# Patient Record
Sex: Male | Born: 1965 | Race: White | Hispanic: Yes | Marital: Married | Smoking: Never smoker
Health system: Southern US, Community
[De-identification: ages and names within clinical notes are randomized; demographics above are authoritative.]

## PROBLEM LIST (undated history)

## (undated) DIAGNOSIS — S648X2A Injury of other nerves at wrist and hand level of left arm, initial encounter: Secondary | ICD-10-CM

## (undated) DIAGNOSIS — J069 Acute upper respiratory infection, unspecified: Secondary | ICD-10-CM

## (undated) DIAGNOSIS — C187 Malignant neoplasm of sigmoid colon: Secondary | ICD-10-CM

## (undated) DIAGNOSIS — G43909 Migraine, unspecified, not intractable, without status migrainosus: Secondary | ICD-10-CM

## (undated) DIAGNOSIS — F329 Major depressive disorder, single episode, unspecified: Secondary | ICD-10-CM

## (undated) DIAGNOSIS — F32A Depression, unspecified: Secondary | ICD-10-CM

## (undated) DIAGNOSIS — Z8619 Personal history of other infectious and parasitic diseases: Secondary | ICD-10-CM

## (undated) DIAGNOSIS — R51 Headache: Secondary | ICD-10-CM

## (undated) DIAGNOSIS — S62633A Displaced fracture of distal phalanx of left middle finger, initial encounter for closed fracture: Secondary | ICD-10-CM

## (undated) DIAGNOSIS — E119 Type 2 diabetes mellitus without complications: Secondary | ICD-10-CM

## (undated) DIAGNOSIS — T7840XA Allergy, unspecified, initial encounter: Secondary | ICD-10-CM

## (undated) DIAGNOSIS — E785 Hyperlipidemia, unspecified: Secondary | ICD-10-CM

## (undated) DIAGNOSIS — K219 Gastro-esophageal reflux disease without esophagitis: Secondary | ICD-10-CM

## (undated) HISTORY — DX: Headache: R51

## (undated) HISTORY — PX: OTHER SURGICAL HISTORY: SHX169

## (undated) HISTORY — DX: Gastro-esophageal reflux disease without esophagitis: K21.9

## (undated) HISTORY — DX: Allergy, unspecified, initial encounter: T78.40XA

## (undated) HISTORY — DX: Depression, unspecified: F32.A

## (undated) HISTORY — DX: Hyperlipidemia, unspecified: E78.5

## (undated) HISTORY — DX: Major depressive disorder, single episode, unspecified: F32.9

## (undated) HISTORY — PX: COLONOSCOPY: SHX174

---
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
## 2010-03-15 ENCOUNTER — Emergency Department (HOSPITAL_COMMUNITY)
Admission: EM | Admit: 2010-03-15 | Discharge: 2010-03-15 | Payer: Self-pay | Source: Home / Self Care | Admitting: Emergency Medicine

## 2010-08-20 ENCOUNTER — Emergency Department (HOSPITAL_COMMUNITY)
Admission: EM | Admit: 2010-08-20 | Discharge: 2010-08-20 | Payer: Self-pay | Source: Home / Self Care | Admitting: Emergency Medicine

## 2010-08-20 LAB — CBC
HCT: 44.3 % (ref 39.0–52.0)
MCHC: 36.3 g/dL — ABNORMAL HIGH (ref 30.0–36.0)
MCV: 85.4 fL (ref 78.0–100.0)
Platelets: 169 10*3/uL (ref 150–400)
RDW: 12.3 % (ref 11.5–15.5)

## 2010-08-20 LAB — BASIC METABOLIC PANEL
BUN: 16 mg/dL (ref 6–23)
Calcium: 8.5 mg/dL (ref 8.4–10.5)
GFR calc Af Amer: >60 mL/min (ref >60)
Glucose, Bld: 155 mg/dL — ABNORMAL HIGH (ref 70–99)
Potassium: 3.7 mEq/L (ref 3.5–5.1)
Sodium: 139 mEq/L (ref 135–145)

## 2010-08-20 LAB — DIFFERENTIAL
Basophils Absolute: 0 10*3/uL (ref 0.0–0.1)
Basophils Relative: 1 % (ref 0–1)
Eosinophils Absolute: 0.2 10*3/uL (ref 0.0–0.7)
Lymphocytes Relative: 28 % (ref 12–46)
Monocytes Absolute: 0.5 10*3/uL (ref 0.1–1.0)
Monocytes Relative: 8 % (ref 3–12)
Neutrophils Relative %: 61 % (ref 43–77)

## 2010-12-11 ENCOUNTER — Encounter: Payer: Self-pay | Admitting: Emergency Medicine

## 2010-12-12 ENCOUNTER — Encounter: Payer: Self-pay | Admitting: Emergency Medicine

## 2010-12-12 ENCOUNTER — Ambulatory Visit (INDEPENDENT_AMBULATORY_CARE_PROVIDER_SITE_OTHER)
Admission: RE | Admit: 2010-12-12 | Discharge: 2010-12-12 | Disposition: A | Discharge status: Home / Self Care | Payer: PRIVATE HEALTH INSURANCE | Source: Ambulatory Visit | Attending: Emergency Medicine | Admitting: Emergency Medicine

## 2010-12-12 ENCOUNTER — Ambulatory Visit (INDEPENDENT_AMBULATORY_CARE_PROVIDER_SITE_OTHER): Payer: PRIVATE HEALTH INSURANCE | Admitting: Emergency Medicine

## 2010-12-12 DIAGNOSIS — R51 Headache: Secondary | ICD-10-CM

## 2010-12-12 DIAGNOSIS — R05 Cough: Secondary | ICD-10-CM

## 2010-12-12 DIAGNOSIS — F329 Major depressive disorder, single episode, unspecified: Secondary | ICD-10-CM

## 2010-12-12 DIAGNOSIS — J309 Allergic rhinitis, unspecified: Secondary | ICD-10-CM | POA: Insufficient documentation

## 2010-12-12 DIAGNOSIS — F32A Depression, unspecified: Secondary | ICD-10-CM | POA: Insufficient documentation

## 2010-12-12 MED ORDER — LORATADINE 10 MG PO CAPS: 10.0000 mg | ORAL_CAPSULE | Freq: Every day | ORAL | Status: DC

## 2010-12-12 MED ORDER — OMEPRAZOLE 20 MG PO TBEC: 20.0000 mg | DELAYED_RELEASE_TABLET | Freq: Every day | ORAL | Status: DC

## 2010-12-12 NOTE — Assessment & Plan Note (Signed)
Full PFT CXR Add loratadine every day Contin nasal steroid Add omeprazole qd  rov next available

## 2010-12-12 NOTE — Patient Instructions (Signed)
Continue your fluticasone nasal spray 2 sprays each nostril twice a day Start loratadine (Claritin) 10mg  daily Start omeprazole 20mg  daily We will perform a CXR today We will perform full pulmonary function testing on the date of your next visit Follow up with Dr Delton Coombes next available appointment

## 2010-12-12 NOTE — Progress Notes (Signed)
  Subjective:    Patient ID: Oscar Mccarty, male    DOB: 1966-04-13, 45 y.o.   MRN: [id number]  HPI 45 yo never smoker, no significant PMH. He presents for cough, referred by Dr Egbert Garibaldi. The cough began about 1 month ago, non-productive. Began to have nasal congestion and drainage more recently but not at the beginning of this illness. He has been treated with prednisone and fluticasone spray for nasal gtt and allergies. Also given an albuterol. Hx of ? GERD. Ventolin doesn't seem to affect the cough. 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    Review of Systems  HENT: Positive for ear pain, congestion, sneezing and postnasal drip.   Respiratory: Positive for cough.   Cardiovascular: Positive for chest pain.  Gastrointestinal: Positive for abdominal pain.       Heartburn Abdominal pain Weight loss--down 5 lbs in 1 month Difficulty swallowing  Neurological: Positive for headaches.    Past Medical History  Diagnosis Date  . Depression   . Allergic rhinitis   . Chronic headaches      No family history on file.   History   Social History  . Marital Status: Married    Spouse Name: N/A    Number of Children: N/A  . Years of Education: N/A   Occupational History  . Not on file.   Social History Main Topics  . Smoking status: Never Smoker   . Smokeless tobacco: Not on file  . Alcohol Use: Yes  . Drug Use: Not on file  . Sexually Active: Not on file   Other Topics Concern  . Not on file   Social History Narrative  . No narrative on file     No Known Allergies   Outpatient Prescriptions Prior to Visit  Medication Sig Dispense Refill  . albuterol (PROVENTIL HFA;VENTOLIN HFA) 108 (90 BASE) MCG/ACT inhaler Inhale 2 puffs into the lungs every 4 (four) hours as needed.        . predniSONE (DELTASONE) 10 MG tablet Take 10 mg by mouth daily. Take 6 x 2 days, 5 x 2 days, 4 x 2 days, 3 x 2 days, 2 x 2 days, 1  x 2 days then stop              Objective:   Physical Exam Gen: Pleasant, well-nourished, in no distress,  normal affect  ENT: No lesions,  mouth clear,  oropharynx clear, no postnasal drip  Neck: No JVD, no TMG, no carotid bruits  Lungs: No use of accessory muscles, no dullness to percussion, clear without rales or rhonchi  Cardiovascular: RRR, heart sounds normal, no murmur or gallops, no peripheral edema  Musculoskeletal: No deformities, no cyanosis or clubbing  Neuro: alert, non focal  Skin: Warm, no lesions or rashes          Assessment & Plan:  Cough Full PFT CXR Add loratadine every day Contin nasal steroid Add omeprazole qd  rov next available

## 2011-01-25 ENCOUNTER — Ambulatory Visit (INDEPENDENT_AMBULATORY_CARE_PROVIDER_SITE_OTHER): Payer: PRIVATE HEALTH INSURANCE | Admitting: Emergency Medicine

## 2011-01-25 ENCOUNTER — Encounter: Payer: Self-pay | Admitting: Emergency Medicine

## 2011-01-25 DIAGNOSIS — J309 Allergic rhinitis, unspecified: Secondary | ICD-10-CM

## 2011-01-25 DIAGNOSIS — R05 Cough: Secondary | ICD-10-CM

## 2011-01-25 DIAGNOSIS — R51 Headache: Secondary | ICD-10-CM

## 2011-01-25 LAB — PULMONARY FUNCTION TEST

## 2011-01-25 MED ORDER — SUMATRIPTAN SUCCINATE 50 MG PO TABS: 25.0000 mg | ORAL_TABLET | Freq: Once | ORAL | Status: DC | PRN

## 2011-01-25 NOTE — Assessment & Plan Note (Signed)
Will stop nasal steroid, continue the loratadine daily for now

## 2011-01-25 NOTE — Progress Notes (Signed)
  Subjective:    Patient ID: Oscar Mccarty, male    DOB: Nov 22, 1965, 45 y.o.   MRN: [id number]  HPI 45 yo never smoker, no significant PMH. He presents for cough, referred by Dr Egbert Garibaldi. The cough began about 1 month ago, non-productive. Began to have nasal congestion and drainage more recently but not at the beginning of this illness. He has been treated with prednisone and fluticasone spray for nasal gtt and allergies. Also given an albuterol. Hx of ? GERD. Ventolin doesn't seem to affect the cough. 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   ROV 01/25/11 -- returns for f/u, tells me that he took loratadine x 1 month, was on fluticasone nasal spray, then started omeprazole (finished about 1 week ago). He continues to have some wheeze in cold air or that wakes him at night. Still with occasional cough. He tells me that the cough is much better.   ROS:  As per HPI     Objective:   Physical Exam Gen: Pleasant, well-nourished, in no distress,  normal affect  ENT: No lesions,  mouth clear,  oropharynx clear, no postnasal drip  Neck: No JVD, no TMG, no carotid bruits  Lungs: No use of accessory muscles, no dullness to percussion, clear without rales or rhonchi  Cardiovascular: RRR, heart sounds normal, no murmur or gallops, no peripheral edema  Musculoskeletal: No deformities, no cyanosis or clubbing  Neuro: alert, non focal  Skin: Warm, no lesions or rashes    Assessment & Plan:  Allergic rhinitis Will stop nasal steroid, continue the loratadine daily for now  Cough Continue to treat allergies Hold omeprazole We will set up methacholine challenge F/u 1 month  Chronic headaches He describes classic migraine HA's - wants a script to try for this - will try Imitrex prn

## 2011-01-25 NOTE — Progress Notes (Signed)
PFT done today. 

## 2011-01-25 NOTE — Assessment & Plan Note (Signed)
He describes classic migraine HA's - wants a script to try for this - will try Imitrex prn

## 2011-01-25 NOTE — Patient Instructions (Signed)
We will set up repeat breathing testing at [facility] (methacholine challenge test) Continue your loratadine 10mg  daily (Claritin)  Stop nasal spray and omeprazole Follow with Dr Delton Coombes in 1 month

## 2011-01-25 NOTE — Assessment & Plan Note (Signed)
Continue to treat allergies Hold omeprazole We will set up methacholine challenge F/u 1 month

## 2011-01-30 ENCOUNTER — Ambulatory Visit (HOSPITAL_COMMUNITY)
Admission: RE | Admit: 2011-01-30 | Discharge: 2011-01-30 | Disposition: A | Discharge status: Home / Self Care | Payer: PRIVATE HEALTH INSURANCE | Source: Ambulatory Visit | Attending: Emergency Medicine | Admitting: Emergency Medicine

## 2011-01-30 DIAGNOSIS — R05 Cough: Secondary | ICD-10-CM | POA: Insufficient documentation

## 2011-01-30 DIAGNOSIS — R059 Cough, unspecified: Secondary | ICD-10-CM | POA: Insufficient documentation

## 2011-01-30 LAB — PULMONARY FUNCTION TEST

## 2011-03-02 ENCOUNTER — Encounter: Payer: Self-pay | Admitting: Emergency Medicine

## 2011-03-02 ENCOUNTER — Ambulatory Visit (INDEPENDENT_AMBULATORY_CARE_PROVIDER_SITE_OTHER): Payer: PRIVATE HEALTH INSURANCE | Admitting: Emergency Medicine

## 2011-03-02 DIAGNOSIS — J309 Allergic rhinitis, unspecified: Secondary | ICD-10-CM

## 2011-03-02 DIAGNOSIS — R05 Cough: Secondary | ICD-10-CM

## 2011-03-02 MED ORDER — LORATADINE 10 MG PO TABS: 10.0000 mg | ORAL_TABLET | Freq: Every day | ORAL | Status: DC

## 2011-03-02 NOTE — Progress Notes (Signed)
  Subjective:    Patient ID: Oscar Mccarty, male    DOB: Jun 05, 1966, 45 y.o.   MRN: [id number]  HPI 45 yo never smoker, no significant PMH. He presents for cough, referred by Dr Egbert Garibaldi. The cough began about 1 month ago, non-productive. Began to have nasal congestion and drainage more recently but not at the beginning of this illness. He has been treated with prednisone and fluticasone spray for nasal gtt and allergies. Also given an albuterol. Hx of ? GERD. Ventolin doesn't seem to affect the cough. 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   ROV 01/25/11 -- returns for f/u, tells me that he took loratadine x 1 month, was on fluticasone nasal spray, then started omeprazole (finished about 1 week ago). He continues to have some wheeze in cold air or that wakes him at night. Still with occasional cough. He tells me that the cough is much better.   ROV 03/02/11 -- returns for f/u cough. Last time we continued loratadine prn, held nasal steroid, stopped PPI. Methacholine 7/10 was normal, so no evidence of asthma. He also started imitrex prn for migraines, which helped. Cough is better, only happening rarely now.   ROS:  As per HPI     Objective:   Physical Exam Gen: Pleasant, well-nourished, in no distress,  normal affect  ENT: No lesions,  mouth clear,  oropharynx clear, no postnasal drip  Neck: No JVD, no TMG, no carotid bruits  Lungs: No use of accessory muscles, no dullness to percussion, clear without rales or rhonchi  Cardiovascular: RRR, heart sounds normal, no murmur or gallops, no peripheral edema  Musculoskeletal: No deformities, no cyanosis or clubbing  Neuro: alert, non focal  Skin: Warm, no lesions or rashes    Assessment & Plan:  Allergic rhinitis If allergy sx return then restart loratadine daily  Cough Methacholine negative for asthma. Suspect allergies are his trigger

## 2011-03-02 NOTE — Assessment & Plan Note (Signed)
If allergy sx return then restart loratadine daily

## 2011-03-02 NOTE — Assessment & Plan Note (Signed)
Methacholine negative for asthma. Suspect allergies are his trigger

## 2011-03-02 NOTE — Patient Instructions (Signed)
Your testing shows that you do not have asthma.  If your cough and/or allergies return then start taking loratadine (Claritin) every day Follow up with Dr Delton Coombes if you develop any new problems

## 2011-09-14 ENCOUNTER — Ambulatory Visit (INDEPENDENT_AMBULATORY_CARE_PROVIDER_SITE_OTHER): Payer: PRIVATE HEALTH INSURANCE | Admitting: Adult Health

## 2011-09-14 ENCOUNTER — Encounter: Payer: Self-pay | Admitting: Adult Health

## 2011-09-14 VITALS — BP 104/64 | HR 64 | Temp 97.4°F | Ht 67.0 in | Wt 185.0 lb

## 2011-09-14 DIAGNOSIS — J40 Bronchitis, not specified as acute or chronic: Secondary | ICD-10-CM

## 2011-09-14 MED ORDER — PREDNISONE 10 MG PO TABS: ORAL_TABLET | ORAL | Status: DC

## 2011-09-14 MED ORDER — HYDROCODONE-HOMATROPINE 5-1.5 MG/5ML PO SYRP: 5.0000 mL | ORAL_SOLUTION | Freq: Four times a day (QID) | ORAL | Status: AC | PRN

## 2011-09-14 MED ORDER — MOMETASONE FUROATE 50 MCG/ACT NA SUSP: 2.0000 | Freq: Two times a day (BID) | NASAL | Status: DC

## 2011-09-14 NOTE — Progress Notes (Signed)
  Subjective:    Patient ID: Oscar Mccarty, male    DOB: [birth year], 46 y.o.   MRN: [id number]  HPI 46 yo never smoker, no significant PMH. He presents for cough, referred by Dr Egbert Garibaldi. The cough began about 1 month ago, non-productive. Began to have nasal congestion and drainage more recently but not at the beginning of this illness. He has been treated with prednisone and fluticasone spray for nasal gtt and allergies. Also given an albuterol. Hx of ? GERD. Ventolin doesn't seem to affect the cough. 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   ROV 01/25/11 -- returns for f/u, tells me that he took loratadine x 1 month, was on fluticasone nasal spray, then started omeprazole (finished about 1 week ago). He continues to have some wheeze in cold air or that wakes him at night. Still with occasional cough. He tells me that the cough is much better.   ROV 03/02/11 -- returns for f/u cough. Last time we continued loratadine prn, held nasal steroid, stopped PPI. Methacholine 7/10 was normal, so no evidence of asthma. He also started imitrex prn for migraines, which helped. Cough is better, only happening rarely now.   09/14/11 Acute OV  Presents for persistent sinus pressure/congestion with white drainage, wheezing, dry cough, some increased SOB x3 week. Initially went to urgent care  And was given abx and cough syrup with some help. ? Name of abx is unclear. No records are available for todays visit.  No chest pain or edema. Sinus drainage is clear. No fever. Cough will not go away.     ROS:  Constitutional:   No  weight loss, night sweats,  Fevers, chills,  +fatigue, or  lassitude.  HEENT:   No headaches,  Difficulty swallowing,  Tooth/dental problems, or  Sore throat,                No sneezing, itching, ear ache, + nasal congestion, post nasal drip,   CV:  No chest pain,  Orthopnea, PND, swelling in lower extremities, anasarca,  dizziness, palpitations, syncope.   GI  No heartburn, indigestion, abdominal pain, nausea, vomiting, diarrhea, change in bowel habits, loss of appetite, bloody stools.   Resp:    No coughing up of blood.    No chest wall deformity  Skin: no rash or lesions.  GU: no dysuria, change in color of urine, no urgency or frequency.  No flank pain, no hematuria   MS:  No joint pain or swelling.  No decreased range of motion.  No back pain.  Psych:  No change in mood or affect. No depression or anxiety.  No memory loss.          Objective:   Physical Exam Gen: Pleasant, well-nourished, in no distress,  normal affect  ENT: No lesions,  mouth clear,  oropharynx clear, +postnasal drip  Neck: No JVD, no TMG, no carotid bruits  Lungs: No use of accessory muscles, no dullness to percussion, coarse BS w/ no wheezing.   Cardiovascular: RRR, heart sounds normal, no murmur or gallops, no peripheral edema  Musculoskeletal: No deformities, no cyanosis or clubbing  Neuro: alert, non focal  Skin: Warm, no lesions or rashes    Assessment & Plan:

## 2011-09-14 NOTE — Patient Instructions (Signed)
Prednisone taper over next week.  Zyrtec 10mg  At bedtime  -take every day (this is over the counter )  Nasonex 2 puffs Twice daily  -sent this prescription to pharmacy.  Hydromet 1-2 tsp every 4-6hr As needed  Cough, -may make you sleepy.  follow up Dr. Delton Coombes  In 1 month and As needed

## 2011-09-17 DIAGNOSIS — J40 Bronchitis, not specified as acute or chronic: Secondary | ICD-10-CM | POA: Insufficient documentation

## 2011-09-17 NOTE — Assessment & Plan Note (Addendum)
Slow to resolve with associated rhinitis   Plan:  Prednisone taper over next week.  Zyrtec 10mg  At bedtime  -take every day (this is over the counter )  Nasonex 2 puffs Twice daily  -sent this prescription to pharmacy.  Hydromet 1-2 tsp every 4-6hr As needed  Cough, -may make you sleepy.  follow up Dr. Delton Coombes  In 1 month and As needed

## 2011-10-09 ENCOUNTER — Ambulatory Visit (INDEPENDENT_AMBULATORY_CARE_PROVIDER_SITE_OTHER): Payer: PRIVATE HEALTH INSURANCE | Admitting: Emergency Medicine

## 2011-10-09 ENCOUNTER — Encounter: Payer: Self-pay | Admitting: Emergency Medicine

## 2011-10-09 DIAGNOSIS — R05 Cough: Secondary | ICD-10-CM

## 2011-10-09 DIAGNOSIS — J309 Allergic rhinitis, unspecified: Secondary | ICD-10-CM

## 2011-10-09 DIAGNOSIS — R51 Headache: Secondary | ICD-10-CM

## 2011-10-09 MED ORDER — LORATADINE 10 MG PO TABS
10.0000 mg | ORAL_TABLET | Freq: Every day | ORAL | Status: DC
Start: 2011-10-09 — End: 2019-01-10

## 2011-10-09 MED ORDER — SUMATRIPTAN SUCCINATE 50 MG PO TABS: 25.0000 mg | ORAL_TABLET | Freq: Once | ORAL | Status: DC | PRN

## 2011-10-09 NOTE — Progress Notes (Signed)
  Subjective:    Patient ID: Oscar Mccarty, male    DOB: 18-Jul-1966, 46 y.o.   MRN: [id number]  HPI 46 yo never smoker, no significant PMH. He presents for cough, referred by Dr Egbert Garibaldi. The cough began about 1 month ago, non-productive. Began to have nasal congestion and drainage more recently but not at the beginning of this illness. He has been treated with prednisone and fluticasone spray for nasal gtt and allergies. Also given an albuterol. Hx of ? GERD. Ventolin doesn't seem to affect the cough. 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   ROV 01/25/11 -- returns for f/u, tells me that he took loratadine x 1 month, was on fluticasone nasal spray, then started omeprazole (finished about 1 week ago). He continues to have some wheeze in cold air or that wakes him at night. Still with occasional cough. He tells me that the cough is much better.   ROV 03/02/11 -- returns for f/u cough. Last time we continued loratadine prn, held nasal steroid, stopped PPI. Methacholine 7/10 was normal, so no evidence of asthma. He also started imitrex prn for migraines, which helped. Cough is better, only happening rarely now.   09/14/11 Acute OV  Presents for persistent sinus pressure/congestion with white drainage, wheezing, dry cough, some increased SOB x3 week. Initially went to urgent care  And was given abx and cough syrup with some help. ? Name of abx is unclear. No records are available for todays visit.  No chest pain or edema. Sinus drainage is clear. No fever. Cough will not go away.   ROV 10/09/11 -- Hx allergies and chronic sinus disease, no evidence asthma on methacholine. Seen 2/22 as above, ? Whether he started having more trouble after a URI.  Treated with Pred taper, nasonex added, hydromet prn. He improved but now beginning to have nasal congestion. He is currently taking loratadine and nasonex.      Objective:   Physical Exam Gen:  Pleasant, well-nourished, in no distress,  normal affect  ENT: No lesions,  mouth clear,  oropharynx clear, +postnasal drip  Neck: No JVD, no TMG, no carotid bruits  Lungs: No use of accessory muscles, no dullness to percussion, coarse BS w/ no wheezing.   Cardiovascular: RRR, heart sounds normal, no murmur or gallops, no peripheral edema  Musculoskeletal: No deformities, no cyanosis or clubbing  Neuro: alert, non focal  Skin: Warm, no lesions or rashes    Assessment & Plan:   Allergic rhinitis Loratadine + nasal steroid [place] if he is willing to do Consider referral for skin testing   Chronic headaches Refill imitrex

## 2011-10-09 NOTE — Assessment & Plan Note (Signed)
Refill imitrex

## 2011-10-09 NOTE — Assessment & Plan Note (Signed)
Loratadine + nasal steroid [place] if he is willing to do Consider referral for skin testing

## 2011-10-09 NOTE — Patient Instructions (Signed)
Please continue your Claritin and nasonex spray as you are taking them Consider starting Nasal Saline Washes to clear your nose and sinuses. You can get these over-the-counter at the pharmacy We will refill your Imitrex for migraines If your allergies do not improve we could refer you for allergy testing and possible allergy shots.  Follow with Dr Delton Coombes as needed.

## 2012-10-25 ENCOUNTER — Emergency Department (HOSPITAL_COMMUNITY): Payer: No Typology Code available for payment source

## 2012-10-25 ENCOUNTER — Emergency Department (HOSPITAL_COMMUNITY)
Admission: EM | Admit: 2012-10-25 | Discharge: 2012-10-25 | Disposition: A | Discharge status: Home / Self Care | Payer: No Typology Code available for payment source | Attending: Emergency Medicine | Admitting: Emergency Medicine

## 2012-10-25 ENCOUNTER — Encounter (HOSPITAL_COMMUNITY): Payer: Self-pay | Admitting: Emergency Medicine

## 2012-10-25 DIAGNOSIS — J069 Acute upper respiratory infection, unspecified: Secondary | ICD-10-CM | POA: Insufficient documentation

## 2012-10-25 DIAGNOSIS — F3289 Other specified depressive episodes: Secondary | ICD-10-CM | POA: Insufficient documentation

## 2012-10-25 DIAGNOSIS — G479 Sleep disorder, unspecified: Secondary | ICD-10-CM | POA: Insufficient documentation

## 2012-10-25 DIAGNOSIS — R05 Cough: Secondary | ICD-10-CM | POA: Insufficient documentation

## 2012-10-25 DIAGNOSIS — Z79899 Other long term (current) drug therapy: Secondary | ICD-10-CM | POA: Insufficient documentation

## 2012-10-25 DIAGNOSIS — R079 Chest pain, unspecified: Secondary | ICD-10-CM | POA: Insufficient documentation

## 2012-10-25 DIAGNOSIS — R509 Fever, unspecified: Secondary | ICD-10-CM | POA: Insufficient documentation

## 2012-10-25 DIAGNOSIS — J029 Acute pharyngitis, unspecified: Secondary | ICD-10-CM | POA: Insufficient documentation

## 2012-10-25 DIAGNOSIS — F329 Major depressive disorder, single episode, unspecified: Secondary | ICD-10-CM | POA: Insufficient documentation

## 2012-10-25 DIAGNOSIS — J00 Acute nasopharyngitis [common cold]: Secondary | ICD-10-CM | POA: Insufficient documentation

## 2012-10-25 DIAGNOSIS — J3489 Other specified disorders of nose and nasal sinuses: Secondary | ICD-10-CM | POA: Insufficient documentation

## 2012-10-25 DIAGNOSIS — R059 Cough, unspecified: Secondary | ICD-10-CM | POA: Insufficient documentation

## 2012-10-25 MED ORDER — ALBUTEROL SULFATE (5 MG/ML) 0.5% IN NEBU
5.0000 mg | INHALATION_SOLUTION | Freq: Once | RESPIRATORY_TRACT | Status: AC
  Administered 2012-10-25: 5 mg via RESPIRATORY_TRACT
  Filled 2012-10-25: qty 1

## 2012-10-25 MED ORDER — AMOXICILLIN 500 MG PO CAPS: 500.0000 mg | ORAL_CAPSULE | Freq: Three times a day (TID) | ORAL | Status: DC

## 2012-10-25 MED ORDER — OXYMETAZOLINE HCL 0.05 % NA SOLN
1.0000 | Freq: Once | NASAL | Status: AC
  Administered 2012-10-25: 1 via NASAL
  Filled 2012-10-25: qty 15

## 2012-10-25 MED ORDER — ACETAMINOPHEN-CODEINE 120-12 MG/5ML PO SUSP: 5.0000 mL | Freq: Three times a day (TID) | ORAL | Status: DC | PRN

## 2012-10-25 NOTE — ED Provider Notes (Signed)
History     CSN: [id number]  Arrival date & time 10/25/12  0143   First MD Initiated Contact with Patient 10/25/12 [phone]      Chief Complaint  Patient presents with  . URI    (Consider location/radiation/quality/duration/timing/severity/associated sxs/prior treatment) HPI Hx per PT - sick for the last 4 days with cough cold and congestion. Subjective fever at home yesterday. Difficulty sleeping for last 4 nights, up with dry cough. He has a prescribed inhaler that he takes with intermittent relief. Some chest pain with coughing tonight, sharp in quality, no leg pain or swelling or shortness of breath.  No rash. No known sick contacts. Symptoms moderate in severity. No known alleviating factors otherwise. Past Medical History  Diagnosis Date  . Depression   . Allergic rhinitis   . Chronic headaches     Past Surgical History  Procedure Laterality Date  . No surgical history      No family history on file.  History  Substance Use Topics  . Smoking status: Never Smoker   . Smokeless tobacco: Not on file  . Alcohol Use: Yes     Comment: occ      Review of Systems  Constitutional: Positive for fever.  HENT: Positive for congestion, sore throat and sinus pressure. Negative for neck pain and neck stiffness.   Eyes: Negative for pain.  Respiratory: Positive for cough. Negative for shortness of breath.   Cardiovascular: Positive for chest pain.  Gastrointestinal: Negative for abdominal pain.  Genitourinary: Negative for dysuria.  Musculoskeletal: Negative for back pain.  Skin: Negative for rash.  Neurological: Negative for headaches.  All other systems reviewed and are negative.    Allergies  Review of patient's allergies indicates no known allergies.  Home Medications   Current Outpatient Rx  Name  Route  Sig  Dispense  Refill  . acetaminophen-codeine 120-12 MG/5ML suspension   Oral   Take 5 mLs by mouth every 8 (eight) hours as needed (Take a nighttime as needed  for cough. No driving or working while medicated).   60 mL   0   . amoxicillin (AMOXIL) 500 MG capsule   Oral   Take 1 capsule (500 mg total) by mouth 3 (three) times daily.   21 capsule   0   . EXPIRED: loratadine (CLARITIN) 10 MG tablet   Oral   Take 1 tablet (10 mg total) by mouth daily.   30 tablet   11   . EXPIRED: mometasone (NASONEX) 50 MCG/ACT nasal spray   Nasal   Place 2 sprays into the nose 2 (two) times daily.   17 g   5   . EXPIRED: SUMAtriptan (IMITREX) 50 MG tablet   Oral   Take 0.5 tablets (25 mg total) by mouth once as needed for migraine.   30 tablet   2     This may be repeated ONCE in 2 hours if the HA per ...   . VENTOLIN HFA 108 (90 BASE) MCG/ACT inhaler   Inhalation   Inhale 2 puffs into the lungs Every 4 hours as needed.           BP 130/78  Pulse 68  Temp(Src) 97.9 F (36.6 C) (Oral)  Resp 20  Wt 185 lb (83.915 kg)  BMI 28.97 kg/m2  SpO2 98%  Physical Exam  Constitutional: He is oriented to person, place, and time. He appears well-developed and well-nourished.  HENT:  Head: Normocephalic and atraumatic.  Mouth/Throat: Oropharynx is clear and  moist. No oropharyngeal exudate.  Tender over bilateral maxillary sinuses with mild erythema. Nasal congestion  Eyes: EOM are normal. Pupils are equal, round, and reactive to light.  Neck: Normal range of motion. Neck supple. No tracheal deviation present.  Cardiovascular: Normal rate, regular rhythm and intact distal pulses.   Pulmonary/Chest: Effort normal and breath sounds normal. No stridor. No respiratory distress. He has no wheezes. He has no rales. He exhibits no tenderness.  Abdominal: Soft. Bowel sounds are normal. He exhibits no distension. There is no tenderness. There is no rebound and no guarding.  Musculoskeletal: Normal range of motion. He exhibits no edema.  Lymphadenopathy:    He has no cervical adenopathy.  Neurological: He is alert and oriented to person, place, and time.   Skin: Skin is warm and dry.    ED Course  Procedures (including critical care time)  Labs Reviewed - No data to display Dg Chest 2 View  10/25/2012  *RADIOLOGY REPORT*  Clinical Data: Chest pain, shortness of breath, and congestion.  CHEST - 2 VIEW  Comparison: 12/12/2010  Findings: Mild hyperinflation. The heart size and pulmonary vascularity are normal. The lungs appear clear and expanded without focal air space disease or consolidation. No blunting of the costophrenic angles.  No pneumothorax.  Mediastinal contours appear intact.  No significant change since previous study.  IMPRESSION: No evidence of active pulmonary disease.   Original Report Authenticated By: Burman Nieves, M.D.      1. URI, acute      Date: 10/25/2012  Rate: 75  Rhythm: normal sinus rhythm  QRS Axis: normal  Intervals: normal  ST/T Wave abnormalities: nonspecific ST changes  Conduction Disutrbances:none  Narrative Interpretation:   Old EKG Reviewed: unchanged  Afrin and albuterol provided. Plan prescriptions and outpatient followup with primary care as needed. Infection precautions provided and verbalized as understood. No indication for admission or further workup in the emergency department at this time  MDM  Clinical URI, possible sinusitis.    Evaluated with EKG and chest x-ray  Vital signs and nursing notes considered and reviewed      Sunnie Nielsen, MD 10/25/12 [phone]

## 2012-10-25 NOTE — ED Notes (Signed)
Pt c/o sinus congestion, cough x 4 days. Pt c/o generalized CP with cough onset yesterday.

## 2016-04-03 DIAGNOSIS — S648X2A Injury of other nerves at wrist and hand level of left arm, initial encounter: Secondary | ICD-10-CM

## 2016-04-03 DIAGNOSIS — S62633A Displaced fracture of distal phalanx of left middle finger, initial encounter for closed fracture: Secondary | ICD-10-CM

## 2016-04-03 HISTORY — DX: Displaced fracture of distal phalanx of left middle finger, initial encounter for closed fracture: S62.633A

## 2016-04-03 HISTORY — DX: Injury of other nerves at wrist and hand level of left arm, initial encounter: S64.8X2A

## 2018-09-18 ENCOUNTER — Encounter: Payer: Self-pay | Admitting: Gastroenterology

## 2018-10-08 ENCOUNTER — Telehealth: Payer: Self-pay | Admitting: *Deleted

## 2018-10-08 NOTE — Telephone Encounter (Signed)
Pt returned your call.  I was unable to get a hold of you.  Pt stated that he has not traveled and is not exhibiting any symptoms.

## 2018-10-08 NOTE — Telephone Encounter (Signed)
Attempting Covid screen -  Voice mail not set up yet, no answer - will attempt call later today.

## 2018-10-09 ENCOUNTER — Other Ambulatory Visit: Payer: Self-pay

## 2018-10-09 ENCOUNTER — Ambulatory Visit (AMBULATORY_SURGERY_CENTER): Payer: Self-pay

## 2018-10-09 VITALS — Temp 98.2°F | Ht 68.0 in | Wt 180.8 lb

## 2018-10-09 DIAGNOSIS — Z1211 Encounter for screening for malignant neoplasm of colon: Secondary | ICD-10-CM

## 2018-10-09 MED ORDER — NA SULFATE-K SULFATE-MG SULF 17.5-3.13-1.6 GM/177ML PO SOLN: 1.0000 | Freq: Once | ORAL | 0 refills | Status: AC

## 2018-10-09 NOTE — Progress Notes (Signed)
Denies allergies to eggs or soy products. Denies complication of anesthesia or sedation. Denies use of weight loss medication. Denies use of O2.   Emmi instructions given for colonoscopy.  Patient was given a 15.00 coupon for Suprep.

## 2018-10-22 ENCOUNTER — Encounter: Payer: PRIVATE HEALTH INSURANCE | Admitting: Gastroenterology

## 2018-10-23 ENCOUNTER — Telehealth: Payer: Self-pay | Admitting: *Deleted

## 2018-10-23 NOTE — Telephone Encounter (Signed)
Called patient and left message to notify of cancellation on 11/07/18 due to Covid-19, asked patient to return call to acknowledge receipt of message.

## 2018-11-07 ENCOUNTER — Encounter: Payer: PRIVATE HEALTH INSURANCE | Admitting: Gastroenterology

## 2018-12-04 ENCOUNTER — Telehealth: Payer: Self-pay | Admitting: *Deleted

## 2018-12-04 NOTE — Telephone Encounter (Signed)
Called pt and RS colon with [facility] to 5-29 Friday at 61 am - pt has a Suprep kit from his [facility]- verified address to mail new instructions to pt-  Once pt gets new instructions encouraged him to call with any questions

## 2018-12-11 ENCOUNTER — Telehealth: Payer: Self-pay | Admitting: Gastroenterology

## 2018-12-11 NOTE — Telephone Encounter (Signed)
Patient was rescheduled due to Covid 19. New instructions were mailed to patient. Patient called for clarification of instructions.  Returned patients call and reviewed instructions and answered questions regarding prep. Patient verbalizes understanding.   Riki Sheer, LPN ( [facility] )

## 2018-12-11 NOTE — Telephone Encounter (Signed)
Pt called wanting to go over his prep instructions with the nurse.

## 2018-12-17 ENCOUNTER — Telehealth: Payer: Self-pay

## 2018-12-17 NOTE — Telephone Encounter (Signed)
Covid-19 travel screening questions  Have you traveled in the last 14 days? No If yes where?  Do you now or have you had a fever in the last 14 days? No  Do you have any respiratory symptoms of shortness of breath or cough now or in the last 14 days? No  Do you have any family members or close contacts with diagnosed or suspected Covid-19? No       

## 2018-12-19 ENCOUNTER — Other Ambulatory Visit: Payer: Self-pay

## 2018-12-19 ENCOUNTER — Ambulatory Visit (AMBULATORY_SURGERY_CENTER): Payer: BLUE CROSS/BLUE SHIELD | Admitting: Gastroenterology

## 2018-12-19 ENCOUNTER — Encounter: Payer: Self-pay | Admitting: Gastroenterology

## 2018-12-19 VITALS — BP 123/73 | HR 52 | Temp 98.4°F | Resp 11 | Ht 67.0 in | Wt 183.0 lb

## 2018-12-19 DIAGNOSIS — C187 Malignant neoplasm of sigmoid colon: Secondary | ICD-10-CM

## 2018-12-19 DIAGNOSIS — Z1211 Encounter for screening for malignant neoplasm of colon: Secondary | ICD-10-CM

## 2018-12-19 DIAGNOSIS — D126 Benign neoplasm of colon, unspecified: Secondary | ICD-10-CM

## 2018-12-19 MED ORDER — SODIUM CHLORIDE 0.9 % IV SOLN: 500.0000 mL | Freq: Once | INTRAVENOUS | Status: DC

## 2018-12-19 NOTE — Op Note (Signed)
[facility] Patient Name: Oscar Mccarty Procedure Date: 12/19/2018 9:47 AM MRN: [id number] Endoscopist: Jackquline Denmark , MD Age: 53 Referring MD:  Date of Birth: [birth year] Gender: Male Account #: [id number] Procedure:                Colonoscopy Indications:              Screening for colorectal malignant neoplasm Medicines:                Monitored Anesthesia Care Procedure:                Pre-Anesthesia Assessment:                           - Prior to the procedure, a History and Physical                            was performed, and patient medications and                            allergies were reviewed. The patient's tolerance of                            previous anesthesia was also reviewed. The risks                            and benefits of the procedure and the sedation                            options and risks were discussed with the patient.                            All questions were answered, and informed consent                            was obtained. Prior Anticoagulants: The patient has                            taken no previous anticoagulant or antiplatelet                            agents. ASA Grade Assessment: I - A normal, healthy                            patient. After reviewing the risks and benefits,                            the patient was deemed in satisfactory condition to                            undergo the procedure.                           After obtaining informed consent, the colonoscope  was passed under direct vision. Throughout the                            procedure, the patient's blood pressure, pulse, and                            oxygen saturations were monitored continuously. The                            Colonoscope was introduced through the and advanced                            to the. The Colonoscope was introduced through the                            anus and advanced to the the  cecum, identified by                            appendiceal orifice and ileocecal valve. The                            colonoscopy was performed without difficulty. The                            patient tolerated the procedure well. The quality                            of the bowel preparation was good. Scope In: 9:49:20 AM Scope Out: 10:08:17 AM Scope Withdrawal Time: 0 hours 10 minutes 26 seconds  Total Procedure Duration: 0 hours 18 minutes 57 seconds  Findings:                 An infiltrative, friable 3 cm circumferential                            partially obstructing apple-core mass was found in                            the proximal sigmoid colon/distal descending colon.                            This extended from 35 cm to 38 cm (on withdrawal of                            the scope) and did not allow adult scope CFH190                            (diameter 12.8 mm) to pass. Peds colonoscope                            (diameter 9.2 mm) could barely be passed beyond.  This was biopsied with a cold forceps for                            histology. Distal extent was tattooed with an                            injection of 4 mL of Spot (carbon black).                           The exam was otherwise without abnormality on                            direct and retroflexion views. Complications:            No immediate complications. Estimated Blood Loss:     Estimated blood loss: none. 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 (Biopsied and tattooed). Recommendation:           - Patient has a contact number available for                            emergencies. The signs and symptoms of potential                            delayed complications were discussed with the                            patient. Return to normal activities tomorrow.                             Written discharge instructions were provided to the                            patient.                           - Resume previous diet.                           - Continue present medications.                           - Await pathology results. Thereafter, he would                            need CBC, CMP and CEA level followed by CT                            chest/abdomen/pelvis (will arrange).                           - Return to GI clinic in 1 week. Jackquline Denmark, MD  12/19/2018 10:24:35 AM This report has been signed electronically.

## 2018-12-19 NOTE — Patient Instructions (Signed)
YOU HAD AN ENDOSCOPIC PROCEDURE TODAY AT THE [facility]:   Refer to the procedure report that was given to you for any specific questions about what was found during the examination.  If the procedure report does not answer your questions, please call your gastroenterologist to clarify.  If you requested that your care partner not be given the details of your procedure findings, then the procedure report has been included in a sealed envelope for you to review at your convenience later.  YOU SHOULD EXPECT: Some feelings of bloating in the abdomen. Passage of more gas than usual.  Walking can help get rid of the air that was put into your GI tract during the procedure and reduce the bloating. If you had a lower endoscopy (such as a colonoscopy or flexible sigmoidoscopy) you may notice spotting of blood in your stool or on the toilet paper. If you underwent a bowel prep for your procedure, you may not have a normal bowel movement for a few days.  Please Note:  You might notice some irritation and congestion in your nose or some drainage.  This is from the oxygen used during your procedure.  There is no need for concern and it should clear up in a day or so.  SYMPTOMS TO REPORT IMMEDIATELY:   Following lower endoscopy (colonoscopy or flexible sigmoidoscopy):  Excessive amounts of blood in the stool  Significant tenderness or worsening of abdominal pains  Swelling of the abdomen that is new, acute  Fever of 100F or higher  For urgent or emergent issues, a gastroenterologist can be reached at any hour by calling [phone].   DIET:  We do recommend a small meal at first, but then you may proceed to your regular diet.  Drink plenty of fluids but you should avoid alcoholic beverages for 24 hours.  ACTIVITY:  You should plan to take it easy for the rest of today and you should NOT DRIVE or use heavy machinery until tomorrow (because of the sedation medicines used during the test).     FOLLOW UP: Our staff will call the number listed on your records 48-72 hours following your procedure to check on you and address any questions or concerns that you may have regarding the information given to you following your procedure. If we do not reach you, we will leave a message.  We will attempt to reach you two times.  During this call, we will ask if you have developed any symptoms of COVID 19. If you develop any symptoms (ie: fever, flu-like symptoms, shortness of breath, cough etc.) before then, please call [phone].  If you test positive for Covid 19 in the 2 weeks post procedure, please call and report this information to us.    If any biopsies were taken you will be contacted by phone or by letter within the next 1-3 weeks.  Please call us at [phone] if you have not heard about the biopsies in 3 weeks.    SIGNATURES/CONFIDENTIALITY: You and/or your care partner have signed paperwork which will be entered into your electronic medical record.  These signatures attest to the fact that that the information above on your After Visit Summary has been reviewed and is understood.  Full responsibility of the confidentiality of this discharge information lies with you and/or your care-partner. 

## 2018-12-19 NOTE — Progress Notes (Signed)
Pt's states no medical or surgical changes since previsit or office visit. 

## 2018-12-22 ENCOUNTER — Telehealth: Payer: Self-pay | Admitting: *Deleted

## 2018-12-22 ENCOUNTER — Other Ambulatory Visit: Payer: Self-pay | Admitting: *Deleted

## 2018-12-22 DIAGNOSIS — C187 Malignant neoplasm of sigmoid colon: Secondary | ICD-10-CM

## 2018-12-22 DIAGNOSIS — Z8616 Personal history of COVID-19: Secondary | ICD-10-CM

## 2018-12-22 DIAGNOSIS — Z1211 Encounter for screening for malignant neoplasm of colon: Secondary | ICD-10-CM

## 2018-12-22 DIAGNOSIS — C189 Malignant neoplasm of colon, unspecified: Secondary | ICD-10-CM

## 2018-12-22 HISTORY — DX: Malignant neoplasm of sigmoid colon: C18.7

## 2018-12-22 HISTORY — DX: Personal history of COVID-19: Z86.16

## 2018-12-22 NOTE — Telephone Encounter (Addendum)
CT chest/abd/pelvis scheduled at [facility] on 12/24/2018 at 8:00 am. Arrive at 7:45 am. NPO after midnight.   Lab orders in Epic. Patient notified to complete labs after CT.  Patient notified this RN wife would come retrieve CT contrast which is at the desk.   Dr. Wendie Agreste routed report.

## 2018-12-23 ENCOUNTER — Telehealth: Payer: Self-pay

## 2018-12-23 ENCOUNTER — Other Ambulatory Visit: Payer: Self-pay

## 2018-12-23 DIAGNOSIS — C189 Malignant neoplasm of colon, unspecified: Secondary | ICD-10-CM

## 2018-12-23 NOTE — Telephone Encounter (Signed)
  Follow up Call-  Call back number 12/19/2018  Post procedure Call Back phone  # [phone]  Permission to leave phone message Yes  Some recent data might be hidden     Patient questions:  Do you have a fever, pain , or abdominal swelling? No. Pain Score  0 *  Have you tolerated food without any problems? Yes.    Have you been able to return to your normal activities? Yes.    Do you have any questions about your discharge instructions: Diet   No. Medications  No. Follow up visit  No.  Do you have questions or concerns about your Care? No.  Actions: * If pain score is 4 or above: No action needed, pain <4. 1. Have you developed a fever since your procedure? no  2.   Have you had an respiratory symptoms (SOB or cough) since your procedure? no  3.   Have you tested positive for COVID 19 since your procedure no  4.   Have you had any family members/close contacts diagnosed with the COVID 19 since your procedure?  no   If yes to any of these questions please route to Joylene John, RN and Alphonsa Gin, Therapist, sports.

## 2018-12-24 ENCOUNTER — Ambulatory Visit (HOSPITAL_COMMUNITY): Payer: BLUE CROSS/BLUE SHIELD

## 2018-12-24 ENCOUNTER — Other Ambulatory Visit (INDEPENDENT_AMBULATORY_CARE_PROVIDER_SITE_OTHER): Payer: BLUE CROSS/BLUE SHIELD

## 2018-12-24 ENCOUNTER — Other Ambulatory Visit: Payer: Self-pay

## 2018-12-24 ENCOUNTER — Ambulatory Visit (HOSPITAL_COMMUNITY)
Admission: RE | Admit: 2018-12-24 | Discharge: 2018-12-24 | Disposition: A | Discharge status: Home / Self Care | Payer: BC Managed Care – PPO | Source: Ambulatory Visit | Attending: Gastroenterology | Admitting: Gastroenterology

## 2018-12-24 ENCOUNTER — Telehealth: Payer: Self-pay

## 2018-12-24 ENCOUNTER — Encounter (HOSPITAL_COMMUNITY): Payer: Self-pay

## 2018-12-24 DIAGNOSIS — Z1211 Encounter for screening for malignant neoplasm of colon: Secondary | ICD-10-CM | POA: Diagnosis not present

## 2018-12-24 DIAGNOSIS — C189 Malignant neoplasm of colon, unspecified: Secondary | ICD-10-CM | POA: Diagnosis present

## 2018-12-24 LAB — CBC WITH DIFFERENTIAL/PLATELET
Basophils Absolute: 0.1 10*3/uL (ref 0.0–0.1)
Basophils Relative: 1.4 % (ref 0.0–3.0)
Eosinophils Absolute: 0.3 10*3/uL (ref 0.0–0.7)
Eosinophils Relative: 5.7 % — ABNORMAL HIGH (ref 0.0–5.0)
HCT: 46.6 % (ref 39.0–52.0)
Hemoglobin: 16.4 g/dL (ref 13.0–17.0)
Lymphocytes Relative: 32 % (ref 12.0–46.0)
Lymphs Abs: 1.7 10*3/uL (ref 0.7–4.0)
MCHC: 35.3 g/dL (ref 30.0–36.0)
MCV: 84.4 fl (ref 78.0–100.0)
Monocytes Absolute: 0.4 10*3/uL (ref 0.1–1.0)
Monocytes Relative: 8.7 % (ref 3.0–12.0)
Neutro Abs: 2.7 10*3/uL (ref 1.4–7.7)
Neutrophils Relative %: 52.2 % (ref 43.0–77.0)
Platelets: 166 10*3/uL (ref 150.0–400.0)
RBC: 5.52 Mil/uL (ref 4.22–5.81)
RDW: 13.1 % (ref 11.5–15.5)
WBC: 5.2 10*3/uL (ref 4.0–10.5)

## 2018-12-24 LAB — COMPREHENSIVE METABOLIC PANEL
ALT: 28 U/L (ref 0–53)
AST: 17 U/L (ref 0–37)
Albumin: 4.1 g/dL (ref 3.5–5.2)
Alkaline Phosphatase: 79 U/L (ref 39–117)
BUN: 10 mg/dL (ref 6–23)
CO2: 27 mEq/L (ref 19–32)
Calcium: 8.8 mg/dL (ref 8.4–10.5)
Chloride: 101 mEq/L (ref 96–112)
Creatinine, Ser: 0.76 mg/dL (ref 0.40–1.50)
GFR: 108.24 mL/min (ref >60.00)
Glucose, Bld: 160 mg/dL — ABNORMAL HIGH (ref 70–99)
Potassium: 3.9 mEq/L (ref 3.5–5.1)
Sodium: 134 mEq/L — ABNORMAL LOW (ref 135–145)
Total Bilirubin: 0.6 mg/dL (ref 0.2–1.2)
Total Protein: 7.2 g/dL (ref 6.0–8.3)

## 2018-12-24 MED ORDER — IOHEXOL 300 MG/ML  SOLN
100.0000 mL | Freq: Once | INTRAMUSCULAR | Status: AC | PRN
  Administered 2018-12-24: 100 mL via INTRAVENOUS

## 2018-12-24 MED ORDER — SODIUM CHLORIDE (PF) 0.9 % IJ SOLN
INTRAMUSCULAR | Status: AC
  Filled 2018-12-24: qty 50

## 2018-12-24 NOTE — Telephone Encounter (Signed)
Opened in error

## 2018-12-25 ENCOUNTER — Other Ambulatory Visit (INDEPENDENT_AMBULATORY_CARE_PROVIDER_SITE_OTHER): Payer: BC Managed Care – PPO

## 2018-12-25 ENCOUNTER — Encounter: Payer: Self-pay | Admitting: Gastroenterology

## 2018-12-25 ENCOUNTER — Ambulatory Visit (INDEPENDENT_AMBULATORY_CARE_PROVIDER_SITE_OTHER): Payer: BC Managed Care – PPO | Admitting: Gastroenterology

## 2018-12-25 VITALS — Ht 67.0 in | Wt 190.0 lb

## 2018-12-25 DIAGNOSIS — E119 Type 2 diabetes mellitus without complications: Secondary | ICD-10-CM

## 2018-12-25 DIAGNOSIS — C189 Malignant neoplasm of colon, unspecified: Secondary | ICD-10-CM

## 2018-12-25 DIAGNOSIS — C187 Malignant neoplasm of sigmoid colon: Secondary | ICD-10-CM

## 2018-12-25 HISTORY — DX: Type 2 diabetes mellitus without complications: E11.9

## 2018-12-25 LAB — HEMOGLOBIN A1C: Hgb A1c MFr Bld: 9.2 % — ABNORMAL HIGH (ref 4.6–6.5)

## 2018-12-25 LAB — CEA: CEA: 1.6 ng/mL

## 2018-12-25 NOTE — Progress Notes (Signed)
I called him today to make sure he is doing okay. I have discussed the results of CT scan yesterday and today Plan remains the same -Surgical consultation with Dr. Johney Maine. -Await CEA level. -Add hemoglobin A1c -After surgery, appointment with Dr. Sabino Niemann. Learta Codding. -Dr. Wendie Agreste is aware.  I have discussed with him as well.

## 2018-12-29 ENCOUNTER — Telehealth: Payer: Self-pay | Admitting: Gastroenterology

## 2018-12-29 NOTE — Telephone Encounter (Signed)
Spoke to the patient who reported after CT and ingestion of contrast he 3 days later he developed V/D. The patient had his CT on 12/24/2018 and reports on 12/25/2018 he developed fever (orally 101-105), chills, body aches, denies respiratory symptoms. The patient reports a day or two prior to his CT, he visited his wife who recently (exact date unknown) tested positive for COVID-19 along with her mother (the patients mother in law). On 12/26/2018, the patient reported to Urgent Care where he was tested for COVID-19 and reports the test was negative. The patient reports over the weekend his symptoms remained but is now afebrile. He was prescribed an unknown medication at Urgent Care which the patient was on his way to go retrieve from the pharmacy but the patient reported because he still felt ill, he wanted to be retested in case the initial COVID-19 test was a false negative. The patient was asked to call the office with his results. Patient verbalized understanding. Nothing further at the time of the call.  Note: This RN spoke to the patient after ordering the CT and the patient reported he could not come to [facility] to pick up the contrast and would send his wife. It is highly likely that wife who tested positive came to [facility] to pick up the contrast for the patient on 12/23/2018.

## 2019-01-05 ENCOUNTER — Emergency Department (HOSPITAL_COMMUNITY): Payer: BC Managed Care – PPO

## 2019-01-05 ENCOUNTER — Other Ambulatory Visit: Payer: Self-pay

## 2019-01-05 ENCOUNTER — Inpatient Hospital Stay (HOSPITAL_COMMUNITY)
Admission: EM | Admit: 2019-01-05 | Discharge: 2019-01-10 | DRG: 177 | Disposition: A | Discharge status: Home / Self Care | Payer: BC Managed Care – PPO | Attending: Family Medicine | Admitting: Family Medicine

## 2019-01-05 ENCOUNTER — Encounter (HOSPITAL_COMMUNITY): Payer: Self-pay

## 2019-01-05 DIAGNOSIS — J309 Allergic rhinitis, unspecified: Secondary | ICD-10-CM | POA: Diagnosis present

## 2019-01-05 DIAGNOSIS — E871 Hypo-osmolality and hyponatremia: Secondary | ICD-10-CM | POA: Diagnosis present

## 2019-01-05 DIAGNOSIS — J40 Bronchitis, not specified as acute or chronic: Secondary | ICD-10-CM | POA: Diagnosis present

## 2019-01-05 DIAGNOSIS — J069 Acute upper respiratory infection, unspecified: Secondary | ICD-10-CM

## 2019-01-05 DIAGNOSIS — C187 Malignant neoplasm of sigmoid colon: Secondary | ICD-10-CM | POA: Diagnosis present

## 2019-01-05 DIAGNOSIS — E119 Type 2 diabetes mellitus without complications: Secondary | ICD-10-CM | POA: Diagnosis present

## 2019-01-05 DIAGNOSIS — J9601 Acute respiratory failure with hypoxia: Secondary | ICD-10-CM | POA: Diagnosis present

## 2019-01-05 DIAGNOSIS — R740 Nonspecific elevation of levels of transaminase and lactic acid dehydrogenase [LDH]: Secondary | ICD-10-CM | POA: Diagnosis not present

## 2019-01-05 DIAGNOSIS — Z79899 Other long term (current) drug therapy: Secondary | ICD-10-CM

## 2019-01-05 DIAGNOSIS — R590 Localized enlarged lymph nodes: Secondary | ICD-10-CM | POA: Diagnosis present

## 2019-01-05 DIAGNOSIS — K219 Gastro-esophageal reflux disease without esophagitis: Secondary | ICD-10-CM | POA: Diagnosis not present

## 2019-01-05 DIAGNOSIS — J1289 Other viral pneumonia: Secondary | ICD-10-CM | POA: Diagnosis present

## 2019-01-05 DIAGNOSIS — J988 Other specified respiratory disorders: Secondary | ICD-10-CM

## 2019-01-05 DIAGNOSIS — F329 Major depressive disorder, single episode, unspecified: Secondary | ICD-10-CM | POA: Diagnosis present

## 2019-01-05 DIAGNOSIS — U071 COVID-19: Secondary | ICD-10-CM | POA: Diagnosis present

## 2019-01-05 DIAGNOSIS — J189 Pneumonia, unspecified organism: Secondary | ICD-10-CM

## 2019-01-05 HISTORY — DX: Acute upper respiratory infection, unspecified: J06.9

## 2019-01-05 LAB — CBC WITH DIFFERENTIAL/PLATELET
Abs Immature Granulocytes: 0.04 10*3/uL (ref 0.00–0.07)
Basophils Absolute: 0 10*3/uL (ref 0.0–0.1)
Basophils Relative: 0 %
Eosinophils Absolute: 0 10*3/uL (ref 0.0–0.5)
Eosinophils Relative: 0 %
HCT: 45.3 % (ref 39.0–52.0)
Hemoglobin: 15.5 g/dL (ref 13.0–17.0)
Immature Granulocytes: 1 %
Lymphocytes Relative: 13 %
Lymphs Abs: 0.7 10*3/uL (ref 0.7–4.0)
MCH: 29.1 pg (ref 26.0–34.0)
MCHC: 34.2 g/dL (ref 30.0–36.0)
MCV: 85 fL (ref 80.0–100.0)
Monocytes Absolute: 0.3 10*3/uL (ref 0.1–1.0)
Monocytes Relative: 6 %
Neutro Abs: 4.3 10*3/uL (ref 1.7–7.7)
Neutrophils Relative %: 80 %
Platelets: 163 10*3/uL (ref 150–400)
RBC: 5.33 MIL/uL (ref 4.22–5.81)
RDW: 12.2 % (ref 11.5–15.5)
WBC: 5.4 10*3/uL (ref 4.0–10.5)
nRBC: 0 % (ref 0.0–0.2)

## 2019-01-05 LAB — COMPREHENSIVE METABOLIC PANEL
ALT: 54 U/L — ABNORMAL HIGH (ref 0–44)
AST: 55 U/L — ABNORMAL HIGH (ref 15–41)
Albumin: 2.8 g/dL — ABNORMAL LOW (ref 3.5–5.0)
Alkaline Phosphatase: 123 U/L (ref 38–126)
Anion gap: 10 (ref 5–15)
BUN: 14 mg/dL (ref 6–20)
CO2: 21 mmol/L — ABNORMAL LOW (ref 22–32)
Calcium: 7.9 mg/dL — ABNORMAL LOW (ref 8.9–10.3)
Chloride: 101 mmol/L (ref 98–111)
Creatinine, Ser: 0.83 mg/dL (ref 0.61–1.24)
GFR calc Af Amer: >60 mL/min (ref >60)
GFR calc non Af Amer: >60 mL/min (ref >60)
Glucose, Bld: 234 mg/dL — ABNORMAL HIGH (ref 70–99)
Potassium: 3.4 mmol/L — ABNORMAL LOW (ref 3.5–5.1)
Sodium: 132 mmol/L — ABNORMAL LOW (ref 135–145)
Total Bilirubin: 0.8 mg/dL (ref 0.3–1.2)
Total Protein: 7.3 g/dL (ref 6.5–8.1)

## 2019-01-05 LAB — GLUCOSE, CAPILLARY
Glucose-Capillary: 230 mg/dL — ABNORMAL HIGH (ref 70–99)
Glucose-Capillary: 263 mg/dL — ABNORMAL HIGH (ref 70–99)

## 2019-01-05 LAB — PROCALCITONIN: Procalcitonin: 2.87 ng/mL

## 2019-01-05 LAB — C-REACTIVE PROTEIN: CRP: 9.7 mg/dL — ABNORMAL HIGH (ref <1.0)

## 2019-01-05 LAB — SARS CORONAVIRUS 2 BY RT PCR (HOSPITAL ORDER, PERFORMED IN ~~LOC~~ HOSPITAL LAB): SARS Coronavirus 2: POSITIVE — AB

## 2019-01-05 LAB — D-DIMER, QUANTITATIVE: D-Dimer, Quant: 1.54 ug/mL-FEU — ABNORMAL HIGH (ref 0.00–0.50)

## 2019-01-05 LAB — LACTIC ACID, PLASMA: Lactic Acid, Venous: 1.4 mmol/L (ref 0.5–1.9)

## 2019-01-05 LAB — MRSA PCR SCREENING: MRSA by PCR: NEGATIVE

## 2019-01-05 LAB — FERRITIN: Ferritin: 881 ng/mL — ABNORMAL HIGH (ref 24–336)

## 2019-01-05 MED ORDER — ONDANSETRON HCL 4 MG/2ML IJ SOLN: 4.0000 mg | Freq: Four times a day (QID) | INTRAMUSCULAR | Status: DC | PRN

## 2019-01-05 MED ORDER — INSULIN ASPART 100 UNIT/ML ~~LOC~~ SOLN
0.0000 [IU] | Freq: Three times a day (TID) | SUBCUTANEOUS | Status: DC
  Administered 2019-01-05: 5 [IU] via SUBCUTANEOUS
  Administered 2019-01-06 (×2): 8 [IU] via SUBCUTANEOUS

## 2019-01-05 MED ORDER — METHYLPREDNISOLONE SODIUM SUCC 125 MG IJ SOLR
80.0000 mg | Freq: Two times a day (BID) | INTRAMUSCULAR | Status: DC
  Administered 2019-01-05: 80 mg via INTRAVENOUS
  Filled 2019-01-05: qty 2

## 2019-01-05 MED ORDER — SODIUM CHLORIDE 0.9 % IV SOLN: INTRAVENOUS | Status: DC | PRN

## 2019-01-05 MED ORDER — SODIUM CHLORIDE 0.9 % IV SOLN
2.0000 g | INTRAVENOUS | Status: DC
  Administered 2019-01-05 – 2019-01-07 (×3): 2 g via INTRAVENOUS
  Filled 2019-01-05 (×4): qty 20

## 2019-01-05 MED ORDER — SODIUM CHLORIDE 0.9 % IV SOLN
200.0000 mg | Freq: Once | INTRAVENOUS | Status: AC
  Administered 2019-01-05: 200 mg via INTRAVENOUS
  Filled 2019-01-05: qty 40

## 2019-01-05 MED ORDER — SODIUM CHLORIDE 0.9 % IV SOLN
INTRAVENOUS | Status: DC
  Administered 2019-01-05: 09:00:00 via INTRAVENOUS

## 2019-01-05 MED ORDER — ENOXAPARIN SODIUM 40 MG/0.4ML ~~LOC~~ SOLN: 40.0000 mg | SUBCUTANEOUS | Status: DC

## 2019-01-05 MED ORDER — METHYLPREDNISOLONE SODIUM SUCC 125 MG IJ SOLR
80.0000 mg | Freq: Two times a day (BID) | INTRAMUSCULAR | Status: DC
  Administered 2019-01-06 – 2019-01-07 (×3): 80 mg via INTRAVENOUS
  Filled 2019-01-05 (×4): qty 2

## 2019-01-05 MED ORDER — CHLORHEXIDINE GLUCONATE CLOTH 2 % EX PADS
6.0000 | MEDICATED_PAD | Freq: Every day | CUTANEOUS | Status: DC
  Administered 2019-01-06 – 2019-01-07 (×2): 6 via TOPICAL

## 2019-01-05 MED ORDER — SODIUM CHLORIDE 0.9 % IV SOLN
500.0000 mg | INTRAVENOUS | Status: DC
  Administered 2019-01-05 – 2019-01-07 (×3): 500 mg via INTRAVENOUS
  Filled 2019-01-05 (×4): qty 500

## 2019-01-05 MED ORDER — ACETAMINOPHEN 325 MG PO TABS: 650.0000 mg | ORAL_TABLET | Freq: Four times a day (QID) | ORAL | Status: DC | PRN

## 2019-01-05 MED ORDER — SODIUM CHLORIDE 0.9 % IV SOLN
100.0000 mg | INTRAVENOUS | Status: AC
  Administered 2019-01-06 – 2019-01-09 (×4): 100 mg via INTRAVENOUS
  Filled 2019-01-05 (×4): qty 20

## 2019-01-05 MED ORDER — ORAL CARE MOUTH RINSE
15.0000 mL | Freq: Two times a day (BID) | OROMUCOSAL | Status: DC
  Administered 2019-01-06 – 2019-01-10 (×8): 15 mL via OROMUCOSAL

## 2019-01-05 MED ORDER — LIVING WELL WITH DIABETES BOOK - IN SPANISH
Freq: Once | Status: AC
  Administered 2019-01-05: 22:00:00
  Filled 2019-01-05: qty 1

## 2019-01-05 MED ORDER — INSULIN GLARGINE 100 UNIT/ML ~~LOC~~ SOLN
15.0000 [IU] | Freq: Every day | SUBCUTANEOUS | Status: DC
  Administered 2019-01-05 – 2019-01-06 (×2): 15 [IU] via SUBCUTANEOUS
  Filled 2019-01-05 (×3): qty 0.15

## 2019-01-05 MED ORDER — ONDANSETRON HCL 4 MG PO TABS: 4.0000 mg | ORAL_TABLET | Freq: Four times a day (QID) | ORAL | Status: DC | PRN

## 2019-01-05 MED ORDER — TOCILIZUMAB 400 MG/20ML IV SOLN
8.0000 mg/kg | Freq: Once | INTRAVENOUS | Status: AC
  Administered 2019-01-05: 690 mg via INTRAVENOUS
  Filled 2019-01-05: qty 20

## 2019-01-05 MED ORDER — ENOXAPARIN SODIUM 40 MG/0.4ML ~~LOC~~ SOLN
40.0000 mg | Freq: Two times a day (BID) | SUBCUTANEOUS | Status: DC
  Administered 2019-01-05 – 2019-01-08 (×6): 40 mg via SUBCUTANEOUS
  Filled 2019-01-05 (×6): qty 0.4

## 2019-01-05 NOTE — Plan of Care (Signed)

## 2019-01-05 NOTE — ED Notes (Signed)
[facility] called for transport. 

## 2019-01-05 NOTE — ED Notes (Signed)
ED TO INPATIENT HANDOFF REPORT  Name/Age/Gender [facility] 53 y.o. male  Code Status    Code Status Orders  (From admission, onward)         Start     Ordered   01/05/19 1223  Full code  Continuous     01/05/19 1226        Code Status History    This patient has a current code status but no historical code status.   Advance Care Planning Activity    Advance Directive Documentation     Most Recent Value  Type of Advance Directive  Living will  Pre-existing out of facility DNR order (yellow form or pink MOST form)  -  "MOST" Form in Place?  -      Home/SNF/Other Home  Chief Complaint cough dizziness shob  Level of Care/Admitting Diagnosis ED Disposition    ED Disposition Condition [place]: [place] [100101]  Level of Care: ICU [6]  Covid Evaluation: Confirmed COVID Positive  Isolation Risk Level: High Risk/Airborne (Aerosolizing procedure, nebulizer, intubated/ventilation, CPAP/BiPAP)  Diagnosis: [id number]-[id number] [[id number]]  Admitting Physician: Domenic Polite [[id number]]  Attending Physician: Domenic Polite [[id number]]  Estimated length of stay: > 1 week  Certification:: I certify this patient will need inpatient services for at least 2 midnights  PT Class (Do Not Modify): Inpatient [101]  PT Acc Code (Do Not Modify): Private [1]       Medical History Past Medical History:  Diagnosis Date  . Allergic rhinitis   . Chronic headaches   . Depression   . GERD (gastroesophageal reflux disease)     Allergies No Known Allergies  IV Location/Drains/Wounds Patient Lines/Drains/Airways Status   Active Line/Drains/Airways    Name:   Placement date:   Placement time:   Site:   Days:   Peripheral IV 01/05/19 Left Antecubital   01/05/19    0855    Antecubital   less than 1          Labs/Imaging Results for orders placed or performed during the hospital encounter of 01/05/19 (from the past 48 hour(s))  CBC with  Differential/Platelet     Status: None   Collection Time: 01/05/19  8:18 AM  Result Value Ref Range   WBC 5.4 4.0 - 10.5 K/uL   RBC 5.33 4.22 - 5.81 MIL/uL   Hemoglobin 15.5 13.0 - 17.0 g/dL   HCT 45.3 39.0 - 52.0 %   MCV 85.0 80.0 - 100.0 fL   MCH 29.1 26.0 - 34.0 pg   MCHC 34.2 30.0 - 36.0 g/dL   RDW 12.2 11.5 - 15.5 %   Platelets 163 150 - 400 K/uL   nRBC 0.0 0.0 - 0.2 %   Neutrophils Relative % 80 %   Neutro Abs 4.3 1.7 - 7.7 K/uL   Lymphocytes Relative 13 %   Lymphs Abs 0.7 0.7 - 4.0 K/uL   Monocytes Relative 6 %   Monocytes Absolute 0.3 0.1 - 1.0 K/uL   Eosinophils Relative 0 %   Eosinophils Absolute 0.0 0.0 - 0.5 K/uL   Basophils Relative 0 %   Basophils Absolute 0.0 0.0 - 0.1 K/uL   WBC Morphology MORPHOLOGY UNREMARKABLE    Immature Granulocytes 1 %   Abs Immature Granulocytes 0.04 0.00 - 0.07 K/uL    Comment: Performed at [facility], [place] [address], [place], [place] [address]  Comprehensive metabolic panel     Status: Abnormal   Collection Time:  01/05/19  8:18 AM  Result Value Ref Range   Sodium 132 (L) 135 - 145 mmol/L   Potassium 3.4 (L) 3.5 - 5.1 mmol/L   Chloride 101 98 - 111 mmol/L   CO2 21 (L) 22 - 32 mmol/L   Glucose, Bld 234 (H) 70 - 99 mg/dL   BUN 14 6 - 20 mg/dL   Creatinine, Ser 0.83 0.61 - 1.24 mg/dL   Calcium 7.9 (L) 8.9 - 10.3 mg/dL   Total Protein 7.3 6.5 - 8.1 g/dL   Albumin 2.8 (L) 3.5 - 5.0 g/dL   AST 55 (H) 15 - 41 U/L   ALT 54 (H) 0 - 44 U/L   Alkaline Phosphatase 123 38 - 126 U/L   Total Bilirubin 0.8 0.3 - 1.2 mg/dL   GFR calc non Af Amer >60 >60 mL/min   GFR calc Af Amer >60 >60 mL/min   Anion gap 10 5 - 15    Comment: Performed at [facility], [place] [address] [place], [place] [address]  Lactic acid, plasma     Status: None   Collection Time: 01/05/19  8:18 AM  Result Value Ref Range   Lactic Acid, Venous 1.4 0.5 - 1.9 mmol/L    Comment: Performed at [facility], [place]  [address], [place], [place] [address]  SARS Coronavirus 2 (CEPHEID- Performed in [place] hospital lab), Hosp Order     Status: Abnormal   Collection Time: 01/05/19  8:18 AM   Specimen: Nasopharyngeal Swab  Result Value Ref Range   SARS Coronavirus 2 POSITIVE (A) NEGATIVE    Comment: RESULT CALLED TO, READ BACK BY AND VERIFIED WITH: L.VAUGHN RN AT 1048 ON 01/05/2019 BY S.VANHOORNE (NOTE) If result is NEGATIVE SARS-CoV-2 target nucleic acids are NOT DETECTED. The SARS-CoV-2 RNA is generally detectable in upper and lower  respiratory specimens during the acute phase of infection. The lowest  concentration of SARS-CoV-2 viral copies this assay can detect is 250  copies / mL. A negative result does not preclude SARS-CoV-2 infection  and should not be used as the sole basis for treatment or other  patient management decisions.  A negative result may occur with  improper specimen collection / handling, submission of specimen other  than nasopharyngeal swab, presence of viral mutation(s) within the  areas targeted by this assay, and inadequate number of viral copies  (<250 copies / mL). A negative result must be combined with clinical  observations, patient history, and epidemiological information. If result is POSITIVE SARS-CoV-2 target nucleic acids are  DETECTED. The SARS-CoV-2 RNA is generally detectable in upper and lower  respiratory specimens during the acute phase of infection.  Positive  results are indicative of active infection with SARS-CoV-2.  Clinical  correlation with patient history and other diagnostic information is  necessary to determine patient infection status.  Positive results do  not rule out bacterial infection or co-infection with other viruses. If result is PRESUMPTIVE POSTIVE SARS-CoV-2 nucleic acids MAY BE PRESENT.   A presumptive positive result was obtained on the submitted specimen  and confirmed on repeat testing.  While 2019 novel coronavirus   (SARS-CoV-2) nucleic acids may be present in the submitted sample  additional confirmatory testing may be necessary for epidemiological  and / or clinical management purposes  to differentiate between  SARS-CoV-2 and other Sarbecovirus currently known to infect humans.  If clinically indicated additional testing with an alternate test  methodology  [phone]) is advised. The SARS-CoV-2 RNA is  generally  detectable in upper and lower respiratory specimens during the acute  phase of infection. The expected result is Negative. Fact Sheet for Patients:  StrictlyIdeas.no Fact Sheet for Healthcare Providers: BankingDealers.co.za This test is not yet approved or cleared by the [place] FDA and has been authorized for detection and/or diagnosis of SARS-CoV-2 by FDA under an Emergency Use Authorization (EUA).  This EUA will remain in effect (meaning this test can be used) for the duration of the COVID-19 declaration under Section 564(b)(1) of the Act, 21 U.S.C. section 360bbb-3(b)(1), unless the authorization is terminated or revoked sooner. Performed at [facility], [place] [address] [place], [place] [address]   D-dimer, quantitative (not at [facility])     Status: Abnormal   Collection Time: 01/05/19 11:26 AM  Result Value Ref Range   D-Dimer, Quant 1.54 (H) 0.00 - 0.50 ug/mL-FEU    Comment: (NOTE) At the manufacturer cut-off of 0.50 ug/mL FEU, this assay has been documented to exclude PE with a sensitivity and negative predictive value of 97 to 99%.  At this time, this assay has not been approved by the FDA to exclude DVT/VTE. Results should be correlated with clinical presentation. Performed at [facility], [place] [address], [place], [place] [address]   Procalcitonin - Baseline     Status: None   Collection Time: 01/05/19 11:26 AM  Result Value Ref Range   Procalcitonin 2.87 ng/mL    Comment:         Interpretation: PCT > 2 ng/mL: Systemic infection (sepsis) is likely, unless other causes are known. (NOTE)       Sepsis PCT Algorithm           Lower Respiratory Tract                                      Infection PCT Algorithm    ----------------------------     ----------------------------         PCT < 0.25 ng/mL                PCT < 0.10 ng/mL         Strongly encourage             Strongly discourage   discontinuation of antibiotics    initiation of antibiotics    ----------------------------     -----------------------------       PCT 0.25 - 0.50 ng/mL            PCT 0.10 - 0.25 ng/mL               OR       >80% decrease in PCT            Discourage initiation of                                            antibiotics      Encourage discontinuation           of antibiotics    ----------------------------     -----------------------------         PCT >= 0.50 ng/mL              PCT 0.26 - 0.50 ng/mL  AND       <80% decrease in PCT              Encourage initiation of                                             antibiotics       Encourage continuation           of antibiotics    ----------------------------     -----------------------------        PCT >= 0.50 ng/mL                  PCT > 0.50 ng/mL               AND         increase in PCT                  Strongly encourage                                      initiation of antibiotics    Strongly encourage escalation           of antibiotics                                     -----------------------------                                           PCT <= 0.25 ng/mL                                                 OR                                        > 80% decrease in PCT                                     Discontinue / Do not initiate                                             antibiotics Performed at [place] [address] [place], [place] [address]    Dg Chest Port 1  View  Result Date: 01/05/2019 CLINICAL DATA:  Worsening shortness of breath and difficulty breathing. Poor oxygen saturation. EXAM: PORTABLE CHEST 1 VIEW COMPARISON:  CT 12/24/2018 FINDINGS: Heart and mediastinal shadows are normal. New bilateral patchy pulmonary infiltrates which could be due to bacterial or viral pneumonia. No evidence of heart failure or effusion. No bone abnormality. IMPRESSION: New widespread pulmonary infiltrates which could be due to bacterial or viral pneumonia, including coronavirus. Electronically Signed   By: Elta Guadeloupe  Shogry M.D.   On: 01/05/2019 10:27    Pending Labs Unresulted Labs (From admission, onward)    Start     Ordered   01/12/19 0500  Creatinine, serum  (enoxaparin (LOVENOX)    CrCl >/= 30 ml/min)  Weekly,   R    Comments: while on enoxaparin therapy    01/05/19 1226   01/06/19 0500  D-dimer, quantitative (not at [facility])  Tomorrow morning,   R     01/05/19 1226   01/06/19 0500  Ferritin  Tomorrow morning,   R     01/05/19 1226   01/06/19 0500  C-reactive protein  Tomorrow morning,   R     01/05/19 1226   01/05/19 1222  HIV antibody (Routine Testing)  Once,   STAT     01/05/19 1226   01/05/19 1222  ABO/Rh  Once,   STAT     01/05/19 1226   01/05/19 1126  C-reactive protein  ONCE - STAT,   STAT     01/05/19 1125   01/05/19 1126  Ferritin  ONCE - STAT,   STAT     01/05/19 1125   01/05/19 0818  Culture, blood (Routine X 2) w Reflex to ID Panel  BLOOD CULTURE X 2,   R (with STAT occurrences)     01/05/19 0817          Vitals/Pain Today's Vitals   01/05/19 1230 01/05/19 1300 01/05/19 1315 01/05/19 1330  BP: 126/85 120/85  120/83  Pulse: 95 87 89 86  Resp: (!) 22 (!) 30 (!) 30 (!) 29  Temp:      TempSrc:      SpO2: 93% 94% 95% 95%  PainSc:        Isolation Precautions Airborne and Contact precautions  Medications Medications  cefTRIAXone (ROCEPHIN) 2 g in sodium chloride 0.9 % 100 mL IVPB (2 g Intravenous New Bag/Given 01/05/19 1103)   azithromycin (ZITHROMAX) 500 mg in sodium chloride 0.9 % 250 mL IVPB (500 mg Intravenous New Bag/Given 01/05/19 1230)  enoxaparin (LOVENOX) injection 40 mg (has no administration in time range)  ondansetron (ZOFRAN) tablet 4 mg (has no administration in time range)    Or  ondansetron (ZOFRAN) injection 4 mg (has no administration in time range)  acetaminophen (TYLENOL) tablet 650 mg (has no administration in time range)  methylPREDNISolone sodium succinate (SOLU-MEDROL) 125 mg/2 mL injection 80 mg (has no administration in time range)    Mobility walks with person assist

## 2019-01-05 NOTE — ED Notes (Signed)
[facility] arrived  

## 2019-01-05 NOTE — ED Notes (Signed)
Increased O2 from 3 to 4L [place].

## 2019-01-05 NOTE — ED Notes (Signed)
Date and time results received: 01/05/19 10:48 AM    Test: Covid-Test Critical Value: Positive   Name of Provider Notified: Lacretia Leigh, Md made aware.

## 2019-01-05 NOTE — ED Notes (Signed)
Pt's O2 sats were still in high 80's.  Pt placed on simple mask, and O2 still did not increase.  Pt then placed on non-rebreather.  Pt's sats now in low 90's.

## 2019-01-05 NOTE — Consult Note (Signed)
NAME:  Oscar Mccarty, MRN:  [id number], DOB:  [birth year], LOS: 0 ADMISSION DATE:  01/05/2019, CONSULTATION DATE:  01/05/2019 REFERRING MD:  Fanny Bien MD, CHIEF COMPLAINT:  Acute resp failure, COVID-19  Brief History   53 year old with recent diagnosis of sigmoid cancer admitted.  Hypoxic respiratory failure.  Tested positive for COVID at [facility].  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 rate (!) 29, SpO2 94 %.    FiO2 (%):  [100 %] 100 %   Intake/Output Summary (Last 24 hours) at 01/05/2019 1708 Last data filed at 01/05/2019 1330 Gross per 24 hour  Intake 150 ml  Output -  Net 150 ml   There were no vitals filed for this visit.  Examination: Gen:      No acute distress HEENT:  EOMI, sclera anicteric Neck:     No masses; no thyromegaly Lungs:    Clear to auscultation bilaterally; normal respiratory effort CV:         Regular rate and rhythm; no murmurs Abd:      + bowel sounds; soft, non-tender; no palpable masses, no distension Ext:    No edema; adequate peripheral perfusion Skin:      Warm and dry; no rash Neuro: alert and oriented x 3 Psych: normal mood and affect  Resolved Hospital Problem list     Assessment & Plan:  Respiratory failure secondary to COVID-19 Continue supportive care High flow nasal cannula Monitor for intubation needs Continue remdesivir, steroids Given actemra x 1 Consent for convalescent plasma given to patient   Diabetes, elevated sugar SSI, Lantus  Best practice:  Diet: NPO Pain/Anxiety/Delirium protocol (if indicated): NA VAP protocol (if indicated):NA DVT prophylaxis: Lovenox GI prophylaxis: NA Glucose control: SSI, lantus Mobility: Bed Code Status: Full Family Communication: Per TRH Disposition: ICU  Labs   CBC: Recent Labs  Lab 01/05/19 0818  WBC 5.4  NEUTROABS 4.3  HGB 15.5  HCT 45.3  MCV 85.0  PLT 829    Basic Metabolic Panel: Recent Labs  Lab 01/05/19 0818  NA 132*  K 3.4*  CL 101  CO2 21*  GLUCOSE 234*  BUN 14  CREATININE 0.83  CALCIUM 7.9*   GFR: Estimated Creatinine Clearance: 111.6 mL/min (by C-G formula based on SCr of 0.83 mg/dL). Recent Labs  Lab 01/05/19 0818 01/05/19 1126  PROCALCITON  --  2.87  WBC 5.4  --   LATICACIDVEN 1.4  --     Liver Function Tests: Recent Labs  Lab 01/05/19 0818  AST 55*  ALT 54*  ALKPHOS 123  BILITOT 0.8  PROT 7.3  ALBUMIN 2.8*   No results for input(s): LIPASE, AMYLASE in the last 168 hours. No results for input(s): AMMONIA in the last 168 hours.  ABG No results found for: PHART, PCO2ART, PO2ART, HCO3, TCO2, ACIDBASEDEF, O2SAT   Coagulation Profile: No results for input(s): INR, PROTIME in the last 168 hours.  Cardiac Enzymes: No results for input(s): CKTOTAL, CKMB, CKMBINDEX, TROPONINI in the last 168 hours.  HbA1C: Hgb A1c MFr Bld  Date/Time Value Ref Range Status  12/25/2018 01:23 PM 9.2 (H) 4.6 - 6.5 % Final    Comment:    Glycemic Control Guidelines for People with Diabetes:Non Diabetic:  <6%Goal of Therapy: <7%Additional Action Suggested:  >8%     CBG: No results for input(s): GLUCAP in the last 168 hours.  Review of Systems:   REVIEW OF SYSTEMS:   All negative; except for those that are bolded, which indicate positives.  Constitutional: weight loss, weight gain, night sweats, fevers, chills, fatigue, weakness.  HEENT: headaches, sore throat, sneezing, nasal congestion, post nasal  drip, difficulty swallowing, tooth/dental problems, visual complaints, visual changes, ear aches. Neuro: difficulty with speech, weakness, numbness, ataxia. CV:  chest pain, orthopnea, PND, swelling in lower extremities, dizziness, palpitations, syncope.  Resp: cough, hemoptysis, dyspnea, wheezing. GI: heartburn, indigestion, abdominal pain, nausea, vomiting, diarrhea, constipation, change in bowel habits, loss of appetite, hematemesis, melena, hematochezia.  GU: dysuria, change in color of urine, urgency or frequency, flank pain, hematuria. MSK: joint pain or swelling, decreased range of motion. Psych: change in mood or affect, depression, anxiety, suicidal ideations, homicidal ideations. Skin: rash, itching, bruising.  Past Medical History  He,  has a past medical history of Allergic rhinitis, Chronic headaches, Depression, and GERD (gastroesophageal reflux disease).   Surgical History    Past Surgical History:  Procedure Laterality Date  . no surgical history       Social History   reports that he has never smoked. He has never used smokeless tobacco. He reports previous alcohol use. He reports that he does not use drugs.   Family History   His family history is negative for Colon cancer, Esophageal cancer, Rectal cancer, and Stomach cancer.   Allergies No Known Allergies   Home Medications  Prior to Admission medications   Medication Sig Start Date End Date Taking? Authorizing Provider  ondansetron (ZOFRAN-ODT) 8 MG disintegrating tablet Take 8 mg by mouth 3 (three) times daily as needed for nausea/vomiting. 12/29/18  Yes [provider]  acetaminophen-codeine 120-12 MG/5ML suspension Take 5 mLs by mouth every 8 (eight) hours as needed (Take a nighttime as needed for cough. No driving or working while medicated). Patient not taking: Reported on 01/05/2019 10/25/12   Teressa Lower, MD  loratadine (CLARITIN) 10 MG tablet Take 1 tablet (10 mg total) by mouth daily. Patient not  taking: Reported on 12/25/2018 10/09/11 12/25/18  Collene Gobble, MD  sildenafil (REVATIO) 20 MG tablet Take 20 mg by mouth as needed.    [provider]  SUMAtriptan (IMITREX) 50 MG tablet Take 50 mg by mouth every 2 (two) hours as needed for migraine. May repeat in 2 hours if headache persists or recurs.    [provider]    The patient is critically ill with multiple organ system failure and requires high complexity decision making for assessment and support, frequent evaluation and titration of therapies, advanced monitoring, review of radiographic studies and interpretation of complex data.   Critical Care Time devoted to patient care services, exclusive of separately billable procedures, described in this note is 35 minutes.   Marshell Garfinkel MD [facility] Pager [phone] If no answer call [phone] 01/05/2019, 5:21 PM

## 2019-01-05 NOTE — ED Provider Notes (Signed)
[facility] DEPT Provider Note   CSN: [id number] Arrival date & time: 01/05/19  0744     History   Chief Complaint Chief Complaint  Patient presents with  . Shortness of Breath    HPI Oscar Mccarty is a 53 y.o. male.     53 year old male presents with several days of increasing dyspnea on exertion as well as cough and fever.  Went to urgent care and was diagnosed with COVID a week ago.  Since then has experienced watery diarrhea.  His temperature has been treated at home with antipyretics without relief.  He notes worsening dyspnea exertion as well as chest tightness when he coughs.  He now feels that he may be dehydrated and has had some posttussive emesis.  Nothing makes his symptoms better     Past Medical History:  Diagnosis Date  . Allergic rhinitis   . Chronic headaches   . Depression   . GERD (gastroesophageal reflux disease)     Patient Active Problem List   Diagnosis Date Noted  . Bronchitis 09/17/2011  . Allergic rhinitis 12/12/2010  . Chronic headaches 12/12/2010  . Depression 12/12/2010  . Cough 12/12/2010    Past Surgical History:  Procedure Laterality Date  . no surgical history          Home Medications    Prior to Admission medications   Medication Sig Start Date End Date Taking? Authorizing Provider  acetaminophen-codeine 120-12 MG/5ML suspension Take 5 mLs by mouth every 8 (eight) hours as needed (Take a nighttime as needed for cough. No driving or working while medicated). 10/25/12   Teressa Lower, MD  loratadine (CLARITIN) 10 MG tablet Take 1 tablet (10 mg total) by mouth daily. Patient not taking: Reported on 12/25/2018 10/09/11 12/25/18  Collene Gobble, MD  sildenafil (REVATIO) 20 MG tablet Take 20 mg by mouth as needed.    [provider]  SUMAtriptan (IMITREX) 50 MG tablet Take 50 mg by mouth every 2 (two) hours as needed for migraine. May repeat in 2 hours if headache persists or recurs.    [provider]    Family History Family History  Problem Relation Age of Onset  . Colon cancer Neg Hx   . Esophageal cancer Neg Hx   . Rectal cancer Neg Hx   . Stomach cancer Neg Hx     Social History Social History   Tobacco Use  . Smoking status: Never Smoker  . Smokeless tobacco: Never Used  Substance Use Topics  . Alcohol use: Not Currently    Comment: occ  . Drug use: No     Allergies   Patient has no known allergies.   Review of Systems Review of Systems  All other systems reviewed and are negative.    Physical Exam Updated Vital Signs BP 125/84 (BP Location: Right Arm)   Pulse 87   Temp (!) 100.5 F (38.1 C) (Oral)   SpO2 (!) 87%   Physical Exam Vitals signs and nursing note reviewed.  Constitutional:      General: He is not in acute distress.    Appearance: Normal appearance. He is well-developed. He is not toxic-appearing.  HENT:     Head: Normocephalic and atraumatic.  Eyes:     General: Lids are normal.     Conjunctiva/sclera: Conjunctivae normal.     Pupils: Pupils are equal, round, and reactive to light.  Neck:     Musculoskeletal: Normal range of motion and neck supple.  Thyroid: No thyroid mass.     Trachea: No tracheal deviation.  Cardiovascular:     Rate and Rhythm: Normal rate and regular rhythm.     Heart sounds: Normal heart sounds. No murmur. No gallop.   Pulmonary:     Effort: Pulmonary effort is normal. No respiratory distress.     Breath sounds: Normal breath sounds. No stridor. No decreased breath sounds, wheezing, rhonchi or rales.  Abdominal:     General: Bowel sounds are normal. There is no distension.     Palpations: Abdomen is soft.     Tenderness: There is no abdominal tenderness. There is no rebound.  Musculoskeletal: Normal range of motion.        General: No tenderness.  Skin:    General: Skin is warm and dry.     Findings: No abrasion or rash.  Neurological:     Mental Status: He is alert and oriented to  person, place, and time.     GCS: GCS eye subscore is 4. GCS verbal subscore is 5. GCS motor subscore is 6.     Cranial Nerves: No cranial nerve deficit.     Sensory: No sensory deficit.  Psychiatric:        Speech: Speech normal.        Behavior: Behavior normal.      ED Treatments / Results  Labs (all labs ordered are listed, but only abnormal results are displayed) Labs Reviewed  CULTURE, BLOOD (ROUTINE X 2)  CULTURE, BLOOD (ROUTINE X 2)  SARS CORONAVIRUS 2 (HOSPITAL ORDER, [facility])  CBC WITH DIFFERENTIAL/PLATELET  COMPREHENSIVE METABOLIC PANEL  LACTIC ACID, PLASMA    EKG    Radiology No results found.  Procedures Procedures (including critical care time)  Medications Ordered in ED Medications  0.9 %  sodium chloride infusion (has no administration in time range)     Initial Impression / Assessment and Plan / ED Course  I have reviewed the triage vital signs and the nursing notes.  Pertinent labs & imaging results that were available during my care of the patient were reviewed by me and considered in my medical decision making (see chart for details).        Patient's chest x-ray consistent with pneumonia which could be COVID versus bacterial.  His COVID test is positive.  Was started on IV antibiotics and blood pressure has remained stable.  Lactate is not significantly elevated.  Did require titration of oxygen.  Will admit to the hospitalist service   CRITICAL CARE Performed by: Leota Jacobsen Total critical care time: 60 minutes Critical care time was exclusive of separately billable procedures and treating other patients. Critical care was necessary to treat or prevent imminent or life-threatening deterioration. Critical care was time spent personally by me on the following activities: development of treatment plan with patient and/or surrogate as well as nursing, discussions with consultants, evaluation of patient's  response to treatment, examination of patient, obtaining history from patient or surrogate, ordering and performing treatments and interventions, ordering and review of laboratory studies, ordering and review of radiographic studies, pulse oximetry and re-evaluation of patient's condition.   Final Clinical Impressions(s) / ED Diagnoses   Final diagnoses:  None    ED Discharge Orders    None       Lacretia Leigh, MD 01/05/19 1112

## 2019-01-05 NOTE — Progress Notes (Addendum)
Pt admitted to [facility] with belongings to include: clothes and a wallet. His wallet is zipped up in his left jacket pocket. He is aware it is there.

## 2019-01-05 NOTE — ED Notes (Signed)
Attempted to call report, nurse unavailable.

## 2019-01-05 NOTE — H&P (Addendum)
History and Physical    Koa Zoeller YPP:[id number] DOB: 1966-03-11 DOA: 01/05/2019  Referring MD/NP/PA: EDP  PCP:  Patient coming from: Home  Chief Complaint: Worsening shortness of breath  HPI: Dickie Cloe is a 53 y.o. male with medical history significant of recent diagnosis of sigmoid colon mass which was positive for adenocarcinoma, found on colonoscopy, he was supposed to see a general surgeon for partial colectomy, as part of his work-up he underwent a CT abdomen pelvis on 6/4. On 6/5 started experiencing cough congestion chest tightness, over the past few days his symptoms have worsened with constant nausea, worsening weakness, increasing chest tightness and shortness of breath along with some dizziness. -He reports productive cough with whitish phlegm.  ED Course: He today was found to be hypoxic with O2 sats of 86% on room air, subsequently he was placed on 3 to 4 L however his sats remained in the high 80s, it was titrated up to 50% Ventimask and eventually to a nonrebreather now, current O2 sats in the low 90s on this. -As were notable for normal lactic acid, CBG of 234, normal white blood cell count mildly elevated AST ALT  Review of Systems: As per HPI otherwise 14 point review of systems negative.   Past Medical History:  Diagnosis Date  . Allergic rhinitis   . Chronic headaches   . Depression   . GERD (gastroesophageal reflux disease)     Past Surgical History:  Procedure Laterality Date  . no surgical history       reports that he has never smoked. He has never used smokeless tobacco. He reports previous alcohol use. He reports that he does not use drugs.  No Known Allergies  Family History  Problem Relation Age of Onset  . Colon cancer Neg Hx   . Esophageal cancer Neg Hx   . Rectal cancer Neg Hx   . Stomach cancer Neg Hx      Prior to Admission medications   Medication Sig Start Date End Date Taking? Authorizing Provider  ondansetron (ZOFRAN-ODT) 8 MG  disintegrating tablet Take 8 mg by mouth 3 (three) times daily as needed for nausea/vomiting. 12/29/18  Yes [provider]  acetaminophen-codeine 120-12 MG/5ML suspension Take 5 mLs by mouth every 8 (eight) hours as needed (Take a nighttime as needed for cough. No driving or working while medicated). Patient not taking: Reported on 01/05/2019 10/25/12   Teressa Lower, MD  loratadine (CLARITIN) 10 MG tablet Take 1 tablet (10 mg total) by mouth daily. Patient not taking: Reported on 12/25/2018 10/09/11 12/25/18  Collene Gobble, MD  sildenafil (REVATIO) 20 MG tablet Take 20 mg by mouth as needed.    [provider]  SUMAtriptan (IMITREX) 50 MG tablet Take 50 mg by mouth every 2 (two) hours as needed for migraine. May repeat in 2 hours if headache persists or recurs.    [provider]    Physical Exam: Vitals:   01/05/19 1100 01/05/19 1130 01/05/19 1200 01/05/19 1230  BP: 120/77 118/82 118/77 126/85  Pulse: 85 83 85 95  Resp: (!) 28 (!) 30 (!) 28 (!) 22  Temp:      TempSrc:      SpO2: 90% 94% 91% 93%      Constitutional: Ill-appearing middle-aged male, laying in bed, AAO x3 Vitals:   01/05/19 1100 01/05/19 1130 01/05/19 1200 01/05/19 1230  BP: 120/77 118/82 118/77 126/85  Pulse: 85 83 85 95  Resp: (!) 28 (!) 30 (!) 28 (!)  22  Temp:      TempSrc:      SpO2: 90% 94% 91% 93%   Eyes: PERRL, lids and conjunctivae normal ENMT: Dry mucous membranes Neck: normal, supple Respiratory: Poor air movement bilaterally, diffuse bronchial breath sounds noted Cardiovascular: S1-S2/regular rhythm, tachycardic Abdomen: soft, non tender, Bowel sounds positive.  Musculoskeletal: No joint deformity upper and lower extremities. Ext: No edema Skin: no rashes, lesions, ulcers.  Neurologic: Moves all extremities, no localizing signs Psychiatric: Normal judgment and insight. Alert and oriented x 3. Normal mood.   Labs on Admission: I have personally reviewed following labs and imaging  studies  CBC: Recent Labs  Lab 01/05/19 0818  WBC 5.4  NEUTROABS 4.3  HGB 15.5  HCT 45.3  MCV 85.0  PLT 536   Basic Metabolic Panel: Recent Labs  Lab 01/05/19 0818  NA 132*  K 3.4*  CL 101  CO2 21*  GLUCOSE 234*  BUN 14  CREATININE 0.83  CALCIUM 7.9*   GFR: Estimated Creatinine Clearance: 111.6 mL/min (by C-G formula based on SCr of 0.83 mg/dL). Liver Function Tests: Recent Labs  Lab 01/05/19 0818  AST 55*  ALT 54*  ALKPHOS 123  BILITOT 0.8  PROT 7.3  ALBUMIN 2.8*   No results for input(s): LIPASE, AMYLASE in the last 168 hours. No results for input(s): AMMONIA in the last 168 hours. Coagulation Profile: No results for input(s): INR, PROTIME in the last 168 hours. Cardiac Enzymes: No results for input(s): CKTOTAL, CKMB, CKMBINDEX, TROPONINI in the last 168 hours. BNP (last 3 results) No results for input(s): PROBNP in the last 8760 hours. HbA1C: No results for input(s): HGBA1C in the last 72 hours. CBG: No results for input(s): GLUCAP in the last 168 hours. Lipid Profile: No results for input(s): CHOL, HDL, LDLCALC, TRIG, CHOLHDL, LDLDIRECT in the last 72 hours. Thyroid Function Tests: No results for input(s): TSH, T4TOTAL, FREET4, T3FREE, THYROIDAB in the last 72 hours. Anemia Panel: No results for input(s): VITAMINB12, FOLATE, FERRITIN, TIBC, IRON, RETICCTPCT in the last 72 hours. Urine analysis: No results found for: COLORURINE, APPEARANCEUR, LABSPEC, PHURINE, GLUCOSEU, HGBUR, BILIRUBINUR, KETONESUR, PROTEINUR, UROBILINOGEN, NITRITE, LEUKOCYTESUR Sepsis Labs: @LABRCNTIP (procalcitonin:4,lacticidven:4) ) Recent Results (from the past 240 hour(s))  SARS Coronavirus 2 (CEPHEID- Performed in [place] hospital lab), Hosp Order     Status: Abnormal   Collection Time: 01/05/19  8:18 AM   Specimen: Nasopharyngeal Swab  Result Value Ref Range Status   SARS Coronavirus 2 POSITIVE (A) NEGATIVE Final    Comment: RESULT CALLED TO, READ BACK BY AND VERIFIED  WITH: L.VAUGHN RN AT 6440 ON 01/05/2019 BY S.VANHOORNE (NOTE) If result is NEGATIVE SARS-CoV-2 target nucleic acids are NOT DETECTED. The SARS-CoV-2 RNA is generally detectable in upper and lower  respiratory specimens during the acute phase of infection. The lowest  concentration of SARS-CoV-2 viral copies this assay can detect is 250  copies / mL. A negative result does not preclude SARS-CoV-2 infection  and should not be used as the sole basis for treatment or other  patient management decisions.  A negative result may occur with  improper specimen collection / handling, submission of specimen other  than nasopharyngeal swab, presence of viral mutation(s) within the  areas targeted by this assay, and inadequate number of viral copies  (<250 copies / mL). A negative result must be combined with clinical  observations, patient history, and epidemiological information. If result is POSITIVE SARS-CoV-2 target nucleic acids are  DETECTED. The SARS-CoV-2 RNA is generally detectable in  upper and lower  respiratory specimens during the acute phase of infection.  Positive  results are indicative of active infection with SARS-CoV-2.  Clinical  correlation with patient history and other diagnostic information is  necessary to determine patient infection status.  Positive results do  not rule out bacterial infection or co-infection with other viruses. If result is PRESUMPTIVE POSTIVE SARS-CoV-2 nucleic acids MAY BE PRESENT.   A presumptive positive result was obtained on the submitted specimen  and confirmed on repeat testing.  While 2019 novel coronavirus  (SARS-CoV-2) nucleic acids may be present in the submitted sample  additional confirmatory testing may be necessary for epidemiological  and / or clinical management purposes  to differentiate between  SARS-CoV-2 and other Sarbecovirus currently known to infect humans.  If clinically indicated additional testing with an alternate test   methodology  [phone]) is advised. The SARS-CoV-2 RNA is generally  detectable in upper and lower respiratory specimens during the acute  phase of infection. The expected result is Negative. Fact Sheet for Patients:  StrictlyIdeas.no Fact Sheet for Healthcare Providers: BankingDealers.co.za This test is not yet approved or cleared by the [place] FDA and has been authorized for detection and/or diagnosis of SARS-CoV-2 by FDA under an Emergency Use Authorization (EUA).  This EUA will remain in effect (meaning this test can be used) for the duration of the COVID-19 declaration under Section 564(b)(1) of the Act, 21 U.S.C. section 360bbb-3(b)(1), unless the authorization is terminated or revoked sooner. Performed at [facility], [place] [address] [place],  [address]      Radiological Exams on Admission: Dg Chest Port 1 View  Result Date: 01/05/2019 CLINICAL DATA:  Worsening shortness of breath and difficulty breathing. Poor oxygen saturation. EXAM: PORTABLE CHEST 1 VIEW COMPARISON:  CT 12/24/2018 FINDINGS: Heart and mediastinal shadows are normal. New bilateral patchy pulmonary infiltrates which could be due to bacterial or viral pneumonia. No evidence of heart failure or effusion. No bone abnormality. IMPRESSION: New widespread pulmonary infiltrates which could be due to bacterial or viral pneumonia, including coronavirus. Electronically Signed   By: Nelson Chimes M.D.   On: 01/05/2019 10:27    EKG: Independently reviewed. pending  Assessment/Plan Active Problems:   Acute Hypoxic resp failure -due to SARS COVID-19 infection -Chest x-ray with diffuse bilateral infiltrates, profound hypoxemia currently requiring nonrebreather mask -Ordered stat CRP, ferritin, d-dimer -We will admit to [facility] for further care -I have ordered IV Actemra and Remdesivir which per pharmacy patient will receive once  he reaches [place], Pharm.D. will update pharmacist at [facility] -IV Solu-Medrol 80 mg every 12 ordered -Supportive care -Lovenox for DVT prophylaxis, titrate dose up as needed -Procalcitonin is elevated raising concern for possible secondary bacterial infection as well, will add ceftriaxone and azithromycin -Pt informed regarding off label use of certain medications like Actemra, to treat cytokine storm in the setting of COVID-19 infection, agrees with this treatment, although he has colon cancer this is early stage, he is not on any active treatment i.e. chemotherapy yet, and his abdominal exam is benign without obstructive signs or symptoms at this time -Also discussed rationale for Remdesivir  Recent diagnosis of colon cancer -Biopsy positive for adeno CA, CT with intra-abdominal lymphadenopathy noted as well -Has been referred to general surgery for this however surgery will be delayed pending current infection -Patient denies any obstructive symptoms at this time, no Abd pain, abd exam is benign  DM2 -recently found as part of preop eval, hemoglobin  A1c was 9.2 on 6/4 -Not on medications -At risk for worsening hyperglycemia on IV steroids, will start him on Lantus and moderate sliding scale   DVT prophylaxis: Lovenox Code Status: Full code Family Communication: No family at bedside, attempted to contact patient's brother Gaylyn Rong by telephone number listed on the facesheet, unable to leave message Disposition Plan: Admit to [employer] called: None Admission status: Inpatient  Domenic Polite MD [facility]   01/05/2019, 12:55 PM

## 2019-01-05 NOTE — ED Triage Notes (Addendum)
Patient C/O shob that has gotten worse the past 3 days.   C/O chest tightness.   Patient states he tested positive on the 5th.   C/O dry mouth  Denies n/v   Patients states he had 1 occurrences of diarrhea this morning.   A/ox4 Ambulatory out of truck to wheelchair.    RA-86%  Patient placed on 2 litters and 02 did not increase.   Patient now on 3 litters.

## 2019-01-05 NOTE — Progress Notes (Signed)
Pts wife called and updated regarding pts status. Pt also spoke with his wife.

## 2019-01-06 ENCOUNTER — Encounter (HOSPITAL_COMMUNITY): Payer: Self-pay

## 2019-01-06 DIAGNOSIS — J40 Bronchitis, not specified as acute or chronic: Secondary | ICD-10-CM

## 2019-01-06 DIAGNOSIS — C187 Malignant neoplasm of sigmoid colon: Secondary | ICD-10-CM

## 2019-01-06 DIAGNOSIS — J069 Acute upper respiratory infection, unspecified: Secondary | ICD-10-CM

## 2019-01-06 LAB — ABO/RH: ABO/RH(D): O POS

## 2019-01-06 LAB — GLUCOSE, CAPILLARY
Glucose-Capillary: 274 mg/dL — ABNORMAL HIGH (ref 70–99)
Glucose-Capillary: 336 mg/dL — ABNORMAL HIGH (ref 70–99)
Glucose-Capillary: 404 mg/dL — ABNORMAL HIGH (ref 70–99)

## 2019-01-06 LAB — FERRITIN: Ferritin: 1245 ng/mL — ABNORMAL HIGH (ref 24–336)

## 2019-01-06 LAB — C-REACTIVE PROTEIN: CRP: 8.3 mg/dL — ABNORMAL HIGH (ref <1.0)

## 2019-01-06 LAB — D-DIMER, QUANTITATIVE: D-Dimer, Quant: 1.01 ug/mL-FEU — ABNORMAL HIGH (ref 0.00–0.50)

## 2019-01-06 LAB — HIV ANTIBODY (ROUTINE TESTING W REFLEX): HIV Screen 4th Generation wRfx: NONREACTIVE

## 2019-01-06 MED ORDER — INSULIN ASPART 100 UNIT/ML ~~LOC~~ SOLN
0.0000 [IU] | Freq: Every day | SUBCUTANEOUS | Status: DC
  Administered 2019-01-06: 5 [IU] via SUBCUTANEOUS
  Administered 2019-01-07 – 2019-01-08 (×2): 2 [IU] via SUBCUTANEOUS

## 2019-01-06 MED ORDER — INSULIN ASPART 100 UNIT/ML ~~LOC~~ SOLN
0.0000 [IU] | Freq: Three times a day (TID) | SUBCUTANEOUS | Status: DC
  Administered 2019-01-06: 15 [IU] via SUBCUTANEOUS
  Administered 2019-01-07: 11 [IU] via SUBCUTANEOUS
  Administered 2019-01-07: 7 [IU] via SUBCUTANEOUS
  Administered 2019-01-07: 15 [IU] via SUBCUTANEOUS
  Administered 2019-01-08: 17:00:00 11 [IU] via SUBCUTANEOUS
  Administered 2019-01-08: 7 [IU] via SUBCUTANEOUS
  Administered 2019-01-09: 3 [IU] via SUBCUTANEOUS
  Administered 2019-01-09: 11 [IU] via SUBCUTANEOUS
  Administered 2019-01-09: 3 [IU] via SUBCUTANEOUS
  Administered 2019-01-10: 4 [IU] via SUBCUTANEOUS
  Administered 2019-01-10: 3 [IU] via SUBCUTANEOUS

## 2019-01-06 NOTE — Plan of Care (Signed)

## 2019-01-06 NOTE — Progress Notes (Signed)
pts wife called and updated, all questions answered. Pt spoke with wife on phone also

## 2019-01-06 NOTE — Plan of Care (Signed)
  Problem: Education: Goal: Knowledge of General Education information will improve Description: Including pain rating scale, medication(s)/side effects and non-pharmacologic comfort measures 01/06/2019 0113 by Henreitta Leber, RN Outcome: Progressing 01/05/2019 2022 by Henreitta Leber, RN Outcome: Progressing   Problem: Health Behavior/Discharge Planning: Goal: Ability to manage health-related needs will improve Outcome: Progressing   Problem: Clinical Measurements: Goal: Ability to maintain clinical measurements within normal limits will improve Outcome: Progressing Goal: Will remain free from infection Outcome: Progressing Goal: Diagnostic test results will improve Outcome: Progressing Goal: Respiratory complications will improve Outcome: Progressing Goal: Cardiovascular complication will be avoided Outcome: Progressing   Problem: Activity: Goal: Risk for activity intolerance will decrease Outcome: Progressing   Problem: Nutrition: Goal: Adequate nutrition will be maintained Outcome: Progressing   Problem: Coping: Goal: Level of anxiety will decrease Outcome: Progressing   Problem: Elimination: Goal: Will not experience complications related to bowel motility Outcome: Progressing Goal: Will not experience complications related to urinary retention Outcome: Progressing   Problem: Pain Managment: Goal: General experience of comfort will improve Outcome: Progressing   Problem: Safety: Goal: Ability to remain free from injury will improve Outcome: Progressing   Problem: Skin Integrity: Goal: Risk for impaired skin integrity will decrease Outcome: Progressing   Problem: Education: Goal: Knowledge of risk factors and measures for prevention of condition will improve Outcome: Progressing   Problem: Coping: Goal: Psychosocial and spiritual needs will be supported Outcome: Progressing   Problem: Respiratory: Goal: Will maintain a patent airway Outcome:  Progressing Goal: Complications related to the disease process, condition or treatment will be avoided or minimized Outcome: Progressing

## 2019-01-06 NOTE — Progress Notes (Signed)
Pt has progressed to OOB to chair and HFNC @ 8L. Received transfer orders for PCU. Enabled Pod Iphone for pt to call family (son) and attempt to touch base with his Job.

## 2019-01-06 NOTE — Progress Notes (Addendum)
PROGRESS NOTE  Oscar Mccarty  ZYS:[id number] DOB: 11/18/1965 DOA: 01/05/2019 PCP: Enid Skeens., MD   Brief Narrative: Oscar Mccarty is a 53 y.o. male with recently diagnosed colon cancer and diabetes not yet having had surgery or started therapy who presented 6/15 with shortness of breath found to be hypoxic. Symptoms had started 6/5 and worsened over the previous few days. CXR demonstrated bilateral infiltrates, inflammatory markers elevated, and covid test positive. Steroids, actemra were given in addition to ceftriaxone and azithromycin and he was transferred to [facility], started remdesivir. He continues to be stable though requiring significant oxygen support.   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 Started on ceftriaxone and azithromycin due to elevated procalcitonin. CRP down, will not repeat dose.  - Continue steroids.  - Discussed use of convalescent plasma with the patient who will continue considering this investigational therapy.  - Continue airborne, contact precautions. PPE including surgical gown, gloves, face shield, cap, shoe covers, and N-95 used during this encounter in a negative pressure room.  - Check daily inflammatory labs, CMP, CBC - Enoxaparin ICU intermediate dosing - Maintain euvolemia/net negative.  - Avoid NSAIDs - Recommend proning and aggressive use of incentive spirometry.  T2DM: Recently diagnosed on screening labs, HbA1c 9.2% - Started lantus, SSI. Tolerating diet, and CBGs elevated. Will change to resistant SSI, add HS coverage.  Sigmoid adenocarcinoma:  - Follow up with general surgery and oncology as outpatient. No evidence of bleeding/obstruction/perforation.  DVT  prophylaxis: Lovenox q12h Code Status: Full code Family Communication: No family at bedside, discussed with patient in detail.  Disposition Plan: Oxygen requirements continue to improve, down to 8LPM, comfortable, will transfer to PCU.  Consultants:   PCCM  Procedures:   None  Antimicrobials:  Ceftriaxone 6/15 >   Azithromycin 6/15 >   Remdesivir 6/15 - 6/19   Subjective: Breathing more easily than yesterday. No chest pain or cough.   Objective: Vitals:   01/06/19 0909 01/06/19 1000 01/06/19 1100 01/06/19 1200  BP: 116/77 114/74 116/75 117/70  Pulse: (!) 59 64 (!) 58 62  Resp: (!) 26 (!) 29 20 (!) 31  Temp:      TempSrc:      SpO2: 91% (!) 89% 91% (!) 88%  Height:        Intake/Output Summary (Last 24 hours) at 01/06/2019 1316 Last data filed at 01/06/2019 1200 Gross per 24 hour  Intake 785.6 ml  Output 975 ml  Net -189.4 ml   There were no vitals filed for this visit.  Gen: 53 y.o. male in no distress  Pulm: Non-labored breathing HFNC. Clear to auscultation bilaterally.  CV: Regular rate and rhythm. No murmur, rub, or gallop. No JVD, no pedal edema. GI: Abdomen soft, non-tender, non-distended, with normoactive bowel sounds. No organomegaly or masses felt. Ext: Warm, no deformities Skin: No rashes, lesions or ulcers Neuro: Alert and oriented. No focal neurological deficits. Psych: Judgement and insight appear normal. Mood & affect appropriate.   Data Reviewed: I have personally reviewed following labs and imaging studies  CBC: Recent Labs  Lab 01/05/19 0818  WBC 5.4  NEUTROABS 4.3  HGB 15.5  HCT 45.3  MCV 85.0  PLT 932   Basic Metabolic Panel: Recent Labs  Lab 01/05/19 0818  NA 132*  K 3.4*  CL 101  CO2 21*  GLUCOSE 234*  BUN 14  CREATININE 0.83  CALCIUM 7.9*   GFR: Estimated Creatinine Clearance: 111.6 mL/min (by C-G formula based on SCr of 0.83 mg/dL). Liver Function Tests: Recent Labs  Lab 01/05/19 0818  AST 55*  ALT 54*   ALKPHOS 123  BILITOT 0.8  PROT 7.3  ALBUMIN 2.8*   No results for input(s): LIPASE, AMYLASE in the last 168 hours. No results for input(s): AMMONIA in the last 168 hours. Coagulation Profile: No results for input(s): INR, PROTIME in the last 168 hours. Cardiac Enzymes: No results for input(s): CKTOTAL, CKMB, CKMBINDEX, TROPONINI in the last 168 hours. BNP (last 3 results) No results for input(s): PROBNP in the last 8760 hours. HbA1C: No results for input(s): HGBA1C in the last 72 hours. CBG: Recent Labs  Lab 01/05/19 1734 01/05/19 2105 01/06/19 0710  GLUCAP 230* 263* 274*   Lipid Profile: No results for input(s): CHOL, HDL, LDLCALC, TRIG, CHOLHDL, LDLDIRECT in the last 72 hours. Thyroid Function Tests: No results for input(s): TSH, T4TOTAL, FREET4, T3FREE, THYROIDAB in the last 72 hours. Anemia Panel: Recent Labs    01/05/19 1300 01/06/19 0543  FERRITIN 881* 1,245*   Urine analysis: No results found for: COLORURINE, APPEARANCEUR, LABSPEC, PHURINE, GLUCOSEU, HGBUR, BILIRUBINUR, KETONESUR, PROTEINUR, UROBILINOGEN, NITRITE, LEUKOCYTESUR Recent Results (from the past 240 hour(s))  SARS Coronavirus 2 (CEPHEID- Performed in [place] hospital lab), Hosp Order     Status: Abnormal   Collection Time: 01/05/19  8:18 AM   Specimen: Nasopharyngeal Swab  Result Value Ref Range Status   SARS Coronavirus 2 POSITIVE (A) NEGATIVE Final    Comment: RESULT CALLED TO, READ BACK BY AND VERIFIED WITH: L.VAUGHN RN AT 2952 ON 01/05/2019 BY S.VANHOORNE (NOTE) If result is NEGATIVE SARS-CoV-2 target nucleic acids are NOT DETECTED. The SARS-CoV-2 RNA is generally detectable in upper and lower  respiratory specimens during the acute phase of infection. The lowest  concentration of SARS-CoV-2 viral copies this assay can detect is 250  copies / mL. A negative result does not preclude SARS-CoV-2 infection  and should not be used as the sole basis for treatment or other  patient management  decisions.  A negative result may occur with  improper specimen collection / handling, submission of specimen other  than nasopharyngeal swab, presence of viral mutation(s) within the  areas targeted by this assay, and inadequate number of viral copies  (<250 copies / mL). A negative result must be combined with clinical  observations, patient history, and epidemiological information. If result is POSITIVE SARS-CoV-2 target nucleic acids are  DETECTED. The SARS-CoV-2 RNA is generally detectable in upper and lower  respiratory specimens during the acute phase of infection.  Positive  results are indicative of active infection with SARS-CoV-2.  Clinical  correlation with patient history and other diagnostic information is  necessary to determine patient infection status.  Positive results do  not rule out bacterial infection or co-infection with other viruses. If result is PRESUMPTIVE POSTIVE SARS-CoV-2 nucleic acids MAY BE PRESENT.   A presumptive positive result was obtained on the submitted specimen  and confirmed on repeat testing.  While 2019 novel coronavirus  (SARS-CoV-2) nucleic acids may be present in the submitted sample  additional confirmatory testing may be necessary for epidemiological  and / or clinical management purposes  to differentiate between  SARS-CoV-2 and other Sarbecovirus currently known to infect humans.  If clinically indicated additional  testing with an alternate test  methodology  [phone]) is advised. The SARS-CoV-2 RNA is generally  detectable in upper and lower respiratory specimens during the acute  phase of infection. The expected result is Negative. Fact Sheet for Patients:  StrictlyIdeas.no Fact Sheet for Healthcare Providers: BankingDealers.co.za This test is not yet approved or cleared by the [place] FDA and has been authorized for detection and/or diagnosis of SARS-CoV-2 by FDA under an  Emergency Use Authorization (EUA).  This EUA will remain in effect (meaning this test can be used) for the duration of the COVID-19 declaration under Section 564(b)(1) of the Act, 21 U.S.C. section 360bbb-3(b)(1), unless the authorization is terminated or revoked sooner. Performed at [facility], [place] [address] [place], [place] [address]   MRSA PCR Screening     Status: None   Collection Time: 01/05/19  4:48 PM   Specimen: Nasopharyngeal  Result Value Ref Range Status   MRSA by PCR NEGATIVE NEGATIVE Final    Comment:        The GeneXpert MRSA Assay (FDA approved for NASAL specimens only), is one component of a comprehensive MRSA colonization surveillance program. It is not intended to diagnose MRSA infection nor to guide or monitor treatment for MRSA infections. Performed at [facility], [place] [address] [place], [place] [address]       Radiology Studies: Dg Chest Port 1 View  Result Date: 01/05/2019 CLINICAL DATA:  Worsening shortness of breath and difficulty breathing. Poor oxygen saturation. EXAM: PORTABLE CHEST 1 VIEW COMPARISON:  CT 12/24/2018 FINDINGS: Heart and mediastinal shadows are normal. New bilateral patchy pulmonary infiltrates which could be due to bacterial or viral pneumonia. No evidence of heart failure or effusion. No bone abnormality. IMPRESSION: New widespread pulmonary infiltrates which could be due to bacterial or viral pneumonia, including coronavirus. Electronically Signed   By: Nelson Chimes M.D.   On: 01/05/2019 10:27    Scheduled Meds: . Chlorhexidine Gluconate Cloth  6 each Topical Daily  . enoxaparin (LOVENOX) injection  40 mg Subcutaneous Q12H  . insulin aspart  0-15 Units Subcutaneous TID WC  . insulin glargine  15 Units Subcutaneous Daily  . mouth rinse  15 mL Mouth Rinse BID  . methylPREDNISolone (SOLU-MEDROL) injection  80 mg Intravenous Q12H   Continuous Infusions: . sodium chloride    . azithromycin  500 mg (01/06/19 0931)  . cefTRIAXone (ROCEPHIN)  IV 2 g (01/06/19 0904)  . remdesivir 100 mg in NS 250 mL       LOS: 1 day   Time spent: 35 minutes.  Patrecia Pour, MD [facility] [email or web address] Password [email or web address] 01/06/2019, 1:16 PM

## 2019-01-06 NOTE — Progress Notes (Signed)
NAME:  Oscar Mccarty, MRN:  [id number], DOB:  Nov 22, 1965, LOS: 1 ADMISSION DATE:  01/05/2019, CONSULTATION DATE:  01/05/2019 REFERRING MD:  Broadus John, CHIEF COMPLAINT:  Dyspnea   Brief History   53 year old male admitted to [facility] on January 05, 2019 for severe acute respiratory failure with hypoxemia due to COVID-19 pneumonia.  Recently diagnosed with colon cancer.  Past Medical History  Adenocarcinoma of the colon, presumably stage 1 or 2, has not seen oncology yet Allergic rhinitis Depression Gastroesophageal reflux disease  Significant Hospital Events   June 15 admission  Consults:  Pulmonary and critical care medicine  Procedures:  None  Significant Diagnostic Tests:  None  Micro Data:  June 15 blood culture June 15 SARS-CoV-2: positive  Antimicrobials:  6/15 remdesivir >  6/16 ceftriaxone >  6/16 azithro >  6/15 actemra x1  Interim history/subjective:  Feels better this morning Oxygenation improved  Objective   Blood pressure 117/70, pulse 62, temperature 98.2 F (36.8 C), temperature source Oral, resp. rate (!) 31, height 5\' 7"  (1.702 m), SpO2 (!) 88 %.    FiO2 (%):  [60 %-100 %] 60 %   Intake/Output Summary (Last 24 hours) at 01/06/2019 1353 Last data filed at 01/06/2019 1200 Gross per 24 hour  Intake 685.6 ml  Output 975 ml  Net -289.4 ml   There were no vitals filed for this visit.  Examination:  General:  Resting comfortably in bed HENT: NCAT OP clear PULM: Crackles bases B, normal effort CV: RRR, no mgr GI: BS+, soft, nontender MSK: normal bulk and tone Neuro: awake, alert, no distress, MAEW   Resolved Hospital Problem list     Assessment & Plan:  Severe acute respiratory failure with hypoxemia due to COVID-19 pneumonia: Continue antiviral therapy with Remdesivir Continue to administer oxygen via heated high flow to maintain O2 saturation greater than 85% Tolerate periods of hypoxemia with movement, talking Decision to intubate  and start on invasive mechanical ventilation should be based on mental status changes or physical evidence of ventilatory failure such as nasal flaring, accessory muscle use, paradoxical breathing Maintain in intensive care unit as he is at high risk for decompensation I spent 10 minutes with him today reviewing the risks and possible benefits of convalescent plasma, questions were answered.  He still is not sure if he wants to take it  Possible superimposed bacterial infection: This is based on an elevated procalcitonin which is of undetermined significance as malignancies have been shown to cause false positives Would consider discontinuing ceftriaxone and azithromycin on June 17 if cultures remain negative  Best practice:   Per triad  Labs   CBC: Recent Labs  Lab 01/05/19 0818  WBC 5.4  NEUTROABS 4.3  HGB 15.5  HCT 45.3  MCV 85.0  PLT 373    Basic Metabolic Panel: Recent Labs  Lab 01/05/19 0818  NA 132*  K 3.4*  CL 101  CO2 21*  GLUCOSE 234*  BUN 14  CREATININE 0.83  CALCIUM 7.9*   GFR: Estimated Creatinine Clearance: 111.6 mL/min (by C-G formula based on SCr of 0.83 mg/dL). Recent Labs  Lab 01/05/19 0818 01/05/19 1126  PROCALCITON  --  2.87  WBC 5.4  --   LATICACIDVEN 1.4  --     Liver Function Tests: Recent Labs  Lab 01/05/19 0818  AST 55*  ALT 54*  ALKPHOS 123  BILITOT 0.8  PROT 7.3  ALBUMIN 2.8*   No results for input(s): LIPASE, AMYLASE in the last 168 hours.  No results for input(s): AMMONIA in the last 168 hours.  ABG No results found for: PHART, PCO2ART, PO2ART, HCO3, TCO2, ACIDBASEDEF, O2SAT   Coagulation Profile: No results for input(s): INR, PROTIME in the last 168 hours.  Cardiac Enzymes: No results for input(s): CKTOTAL, CKMB, CKMBINDEX, TROPONINI in the last 168 hours.  HbA1C: Hgb A1c MFr Bld  Date/Time Value Ref Range Status  12/25/2018 01:23 PM 9.2 (H) 4.6 - 6.5 % Final    Comment:    Glycemic Control Guidelines for People  with Diabetes:Non Diabetic:  <6%Goal of Therapy: <7%Additional Action Suggested:  >8%     CBG: Recent Labs  Lab 01/05/19 1734 01/05/19 2105 01/06/19 0710  GLUCAP 230* 263* 274*     Critical care time: 31 minutes    Roselie Awkward, MD [facility] Pager: [phone] Cell: [phone] If no response, call [phone]

## 2019-01-07 ENCOUNTER — Encounter (HOSPITAL_COMMUNITY): Payer: Self-pay

## 2019-01-07 DIAGNOSIS — E119 Type 2 diabetes mellitus without complications: Secondary | ICD-10-CM

## 2019-01-07 LAB — CBC WITH DIFFERENTIAL/PLATELET
Abs Immature Granulocytes: 0.05 10*3/uL (ref 0.00–0.07)
Basophils Absolute: 0 10*3/uL (ref 0.0–0.1)
Basophils Relative: 0 %
Eosinophils Absolute: 0 10*3/uL (ref 0.0–0.5)
Eosinophils Relative: 0 %
HCT: 41.8 % (ref 39.0–52.0)
Hemoglobin: 14.6 g/dL (ref 13.0–17.0)
Immature Granulocytes: 1 %
Lymphocytes Relative: 11 %
Lymphs Abs: 0.5 10*3/uL — ABNORMAL LOW (ref 0.7–4.0)
MCH: 29.6 pg (ref 26.0–34.0)
MCHC: 34.9 g/dL (ref 30.0–36.0)
MCV: 84.6 fL (ref 80.0–100.0)
Monocytes Absolute: 0.3 10*3/uL (ref 0.1–1.0)
Monocytes Relative: 6 %
Neutro Abs: 4.1 10*3/uL (ref 1.7–7.7)
Neutrophils Relative %: 82 %
Platelets: 220 10*3/uL (ref 150–400)
RBC: 4.94 MIL/uL (ref 4.22–5.81)
RDW: 12.3 % (ref 11.5–15.5)
WBC: 5 10*3/uL (ref 4.0–10.5)
nRBC: 0 % (ref 0.0–0.2)

## 2019-01-07 LAB — COMPREHENSIVE METABOLIC PANEL
ALT: 85 U/L — ABNORMAL HIGH (ref 0–44)
AST: 58 U/L — ABNORMAL HIGH (ref 15–41)
Albumin: 2.6 g/dL — ABNORMAL LOW (ref 3.5–5.0)
Alkaline Phosphatase: 98 U/L (ref 38–126)
Anion gap: 10 (ref 5–15)
BUN: 22 mg/dL — ABNORMAL HIGH (ref 6–20)
CO2: 22 mmol/L (ref 22–32)
Calcium: 8 mg/dL — ABNORMAL LOW (ref 8.9–10.3)
Chloride: 107 mmol/L (ref 98–111)
Creatinine, Ser: 0.66 mg/dL (ref 0.61–1.24)
GFR calc Af Amer: >60 mL/min (ref >60)
GFR calc non Af Amer: >60 mL/min (ref >60)
Glucose, Bld: 302 mg/dL — ABNORMAL HIGH (ref 70–99)
Potassium: 3.9 mmol/L (ref 3.5–5.1)
Sodium: 139 mmol/L (ref 135–145)
Total Bilirubin: 0.6 mg/dL (ref 0.3–1.2)
Total Protein: 6.5 g/dL (ref 6.5–8.1)

## 2019-01-07 LAB — FERRITIN: Ferritin: 972 ng/mL — ABNORMAL HIGH (ref 24–336)

## 2019-01-07 LAB — D-DIMER, QUANTITATIVE: D-Dimer, Quant: 0.68 ug/mL-FEU — ABNORMAL HIGH (ref 0.00–0.50)

## 2019-01-07 LAB — GLUCOSE, CAPILLARY
Glucose-Capillary: 294 mg/dL — ABNORMAL HIGH (ref 70–99)
Glucose-Capillary: 317 mg/dL — ABNORMAL HIGH (ref 70–99)
Glucose-Capillary: 215 mg/dL — ABNORMAL HIGH (ref 70–99)
Glucose-Capillary: 240 mg/dL — ABNORMAL HIGH (ref 70–99)

## 2019-01-07 LAB — C-REACTIVE PROTEIN: CRP: 2.9 mg/dL — ABNORMAL HIGH (ref <1.0)

## 2019-01-07 LAB — PROCALCITONIN: Procalcitonin: 0.81 ng/mL

## 2019-01-07 LAB — LACTATE DEHYDROGENASE: LDH: 397 U/L — ABNORMAL HIGH (ref 98–192)

## 2019-01-07 MED ORDER — METHYLPREDNISOLONE SODIUM SUCC 40 MG IJ SOLR
40.0000 mg | Freq: Every day | INTRAMUSCULAR | Status: AC
  Administered 2019-01-08 – 2019-01-09 (×2): 40 mg via INTRAVENOUS
  Filled 2019-01-07 (×2): qty 1

## 2019-01-07 MED ORDER — GUAIFENESIN-DM 100-10 MG/5ML PO SYRP
5.0000 mL | ORAL_SOLUTION | ORAL | Status: DC | PRN
  Administered 2019-01-07 – 2019-01-10 (×6): 5 mL via ORAL
  Filled 2019-01-07 (×5): qty 10

## 2019-01-07 MED ORDER — INSULIN DETEMIR 100 UNIT/ML ~~LOC~~ SOLN
0.1500 [IU]/kg | Freq: Two times a day (BID) | SUBCUTANEOUS | Status: DC
  Administered 2019-01-07 (×2): 13 [IU] via SUBCUTANEOUS
  Filled 2019-01-07 (×3): qty 0.13

## 2019-01-07 NOTE — Progress Notes (Signed)
PROGRESS NOTE    Oscar Mccarty  OEU:[id number] DOB: 07-Jan-1966 DOA: 01/05/2019 PCP: Enid Skeens., MD      Brief Narrative:  Oscar Mccarty is a 53 y.o. M with recently diagnosed cancer, not yet surgery or started therapy, as well as DM who presented with 1 week malaise, progressed to shortness of breath.  In the ER, chest x-ray with bilateral infiltrates, hypoxic on room air.     Assessment & Plan:  Coronavirus pneumonitis with acute hypoxic respiratory failure In setting of ongoing 2020 COVID-19 pandemic.  S/p Actemra 6/15  Transferred OO ICU 6/16.  Inflammatory markers trending down.  O2 needs 8-11 overnight.  -Continue remdesivir, day 3 of 5 -Continue Steroids, reduce dose  -VTE PPx with Lovenox -Continue Zinc and Vitamin C -Daily d-dimer, ferritin and CRP    COVID-19 Labs Recent Labs    01/05/19 1126 01/05/19 1300 01/06/19 0543 01/07/19 0522  DDIMER 1.54*  --  1.01* 0.68*  FERRITIN  --  881* 1,245* 972*  LDH  --   --   --  397*  CRP  --  9.7* 8.3* 2.9*    Elevated procalcitonin Possible pneumonia Blood cultures negative at 24 hours -Continue ceftriaone and azithromycin  Diabetes Recently diagnosed.  A1c 9.2%.  Glucoses elevated. -Stop Lantus -Start Levemir, increase dose -Continue sliding scale corrections  Colon cancer Needs follow-up with oncology and general surgery as an outpatient.  Hyponatremia Resolved       MDM and disposition: The below labs and imaging reports were reviewed and summarized above.  Medication management as above.  The patient was admitted with severe acute hypoxic respiratory failure due to COVID-19.    We will continue to titrate O2, hopefully he will be weaned down slowly over the next 2-3 days and home within 1 week.       DVT prophylaxis: Lovenox Code Status: FULL Family Communication: Attempted by phone, no answer    Consultants:   CCM  Procedures:   None  Antimicrobials:   Ceftriaxone  6/15 >>  Azithromycin  6/15 >>  Culture data:   6/15 blood culture x2 -- NG       Subjective: All history collected via videophonic interpreter.  No confusion, hemoptysis, vomiting.  No fever. Still severe cough.  Also dizzy and tremling and very out of breath with any exertion.  Objective: Vitals:   01/06/19 1846 01/06/19 1933 01/07/19 0400 01/07/19 0734  BP:  124/79 118/75 122/74  Pulse: 67 66 (!) 59 (!) 57  Resp: (!) 26 20 (!) 21 16  Temp:  98 F (36.7 C) 98.8 F (37.1 C) 98.1 F (36.7 C)  TempSrc:  Oral Oral Oral  SpO2: (!) 89% 90% 92% 90%  Height:        Intake/Output Summary (Last 24 hours) at 01/07/2019 1540 Last data filed at 01/06/2019 2219 Gross per 24 hour  Intake 965.6 ml  Output 1100 ml  Net -134.4 ml   There were no vitals filed for this visit.  Examination: General appearance:  adult male, alert and in no acute distress.   HEENT: Anicteric, conjunctiva pink, lids and lashes normal. No nasal deformity, discharge, epistaxis.  Lips moist, dentition normal, OP moist, no oral lesions, hearing normal.   Skin: Warm and dry.  no jaundice.  No suspicious rashes or lesions. Cardiac: RRR, nl S1-S2, no murmurs appreciated.  Capillary refill is brisk.  JVP normal.  No LE edema.  Radial pulses 2+ and symmetric. Respiratory: Dyspneic with exertion,  no rales, diminished thoguhtout. Abdomen: Abdomen soft.  no TTP. No ascites, distension, hepatosplenomegaly.   MSK: No deformities or effusions. Neuro: Awake and alert.  EOMI, moves all extremities. Speech fluent.    Psych: Sensorium intact and responding to questions, attention normal. Affect normal.  Judgment and insight appear normal.      Data Reviewed: I have personally reviewed following labs and imaging studies:  CBC: Recent Labs  Lab 01/05/19 0818 01/07/19 0522  WBC 5.4 5.0  NEUTROABS 4.3 4.1  HGB 15.5 14.6  HCT 45.3 41.8  MCV 85.0 84.6  PLT 163 161   Basic Metabolic Panel: Recent Labs  Lab  01/05/19 0818 01/07/19 0522  NA 132* 139  K 3.4* 3.9  CL 101 107  CO2 21* 22  GLUCOSE 234* 302*  BUN 14 22*  CREATININE 0.83 0.66  CALCIUM 7.9* 8.0*   GFR: Estimated Creatinine Clearance: 115.8 mL/min (by C-G formula based on SCr of 0.66 mg/dL). Liver Function Tests: Recent Labs  Lab 01/05/19 0818 01/07/19 0522  AST 55* 58*  ALT 54* 85*  ALKPHOS 123 98  BILITOT 0.8 0.6  PROT 7.3 6.5  ALBUMIN 2.8* 2.6*   No results for input(s): LIPASE, AMYLASE in the last 168 hours. No results for input(s): AMMONIA in the last 168 hours. Coagulation Profile: No results for input(s): INR, PROTIME in the last 168 hours. Cardiac Enzymes: No results for input(s): CKTOTAL, CKMB, CKMBINDEX, TROPONINI in the last 168 hours. BNP (last 3 results) No results for input(s): PROBNP in the last 8760 hours. HbA1C: No results for input(s): HGBA1C in the last 72 hours. CBG: Recent Labs  Lab 01/05/19 1734 01/05/19 2105 01/06/19 0710 01/06/19 1633 01/06/19 2101  GLUCAP 230* 263* 274* 336* 404*   Lipid Profile: No results for input(s): CHOL, HDL, LDLCALC, TRIG, CHOLHDL, LDLDIRECT in the last 72 hours. Thyroid Function Tests: No results for input(s): TSH, T4TOTAL, FREET4, T3FREE, THYROIDAB in the last 72 hours. Anemia Panel: Recent Labs    01/06/19 0543 01/07/19 0522  FERRITIN 1,245* 972*   Urine analysis: No results found for: COLORURINE, APPEARANCEUR, LABSPEC, PHURINE, GLUCOSEU, HGBUR, BILIRUBINUR, KETONESUR, PROTEINUR, UROBILINOGEN, NITRITE, LEUKOCYTESUR Sepsis Labs: @LABRCNTIP (procalcitonin:4,lacticacidven:4)  ) Recent Results (from the past 240 hour(s))  Culture, blood (Routine X 2) w Reflex to ID Panel     Status: None (Preliminary result)   Collection Time: 01/05/19  8:18 AM   Specimen: Left Antecubital; Blood  Result Value Ref Range Status   Specimen Description   Final    LEFT ANTECUBITAL Performed at [facility] [address] [place], [place] [address]     Special Requests   Final    BOTTLES DRAWN AEROBIC AND ANAEROBIC Blood Culture results may not be optimal due to an excessive volume of blood received in culture bottles Performed at [facility] [address] [place], [place] [address]    Culture   Final    NO GROWTH 1 DAY Performed at [facility], [place] [address], [place], [place] [address]    Report Status PENDING  Incomplete  SARS Coronavirus 2 (CEPHEID- Performed in [facility] hospital lab), Hosp Order     Status: Abnormal   Collection Time: 01/05/19  8:18 AM   Specimen: Nasopharyngeal Swab  Result Value Ref Range Status   SARS Coronavirus 2 POSITIVE (A) NEGATIVE Final    Comment: RESULT CALLED TO, READ BACK BY AND VERIFIED WITH: L.VAUGHN RN AT 1048 ON 01/05/2019 BY S.VANHOORNE (NOTE) If result is  NEGATIVE SARS-CoV-2 target nucleic acids are NOT DETECTED. The SARS-CoV-2 RNA is generally detectable in upper and lower  respiratory specimens during the acute phase of infection. The lowest  concentration of SARS-CoV-2 viral copies this assay can detect is 250  copies / mL. A negative result does not preclude SARS-CoV-2 infection  and should not be used as the sole basis for treatment or other  patient management decisions.  A negative result may occur with  improper specimen collection / handling, submission of specimen other  than nasopharyngeal swab, presence of viral mutation(s) within the  areas targeted by this assay, and inadequate number of viral copies  (<250 copies / mL). A negative result must be combined with clinical  observations, patient history, and epidemiological information. If result is POSITIVE SARS-CoV-2 target nucleic acids are  DETECTED. The SARS-CoV-2 RNA is generally detectable in upper and lower  respiratory specimens during the acute phase of infection.  Positive  results are indicative of active infection with SARS-CoV-2.  Clinical  correlation with patient history and  other diagnostic information is  necessary to determine patient infection status.  Positive results do  not rule out bacterial infection or co-infection with other viruses. If result is PRESUMPTIVE POSTIVE SARS-CoV-2 nucleic acids MAY BE PRESENT.   A presumptive positive result was obtained on the submitted specimen  and confirmed on repeat testing.  While 2019 novel coronavirus  (SARS-CoV-2) nucleic acids may be present in the submitted sample  additional confirmatory testing may be necessary for epidemiological  and / or clinical management purposes  to differentiate between  SARS-CoV-2 and other Sarbecovirus currently known to infect humans.  If clinically indicated additional testing with an alternate test  methodology  [phone]) is advised. The SARS-CoV-2 RNA is generally  detectable in upper and lower respiratory specimens during the acute  phase of infection. The expected result is Negative. Fact Sheet for Patients:  StrictlyIdeas.no Fact Sheet for Healthcare Providers: BankingDealers.co.za This test is not yet approved or cleared by the [place] FDA and has been authorized for detection and/or diagnosis of SARS-CoV-2 by FDA under an Emergency Use Authorization (EUA).  This EUA will remain in effect (meaning this test can be used) for the duration of the COVID-19 declaration under Section 564(b)(1) of the Act, 21 U.S.C. section 360bbb-3(b)(1), unless the authorization is terminated or revoked sooner. Performed at [facility], [place] [address], [place], [place] [address]   Culture, blood (Routine X 2) w Reflex to ID Panel     Status: None (Preliminary result)   Collection Time: 01/05/19  8:23 AM   Specimen: Right Antecubital; Blood  Result Value Ref Range Status   Specimen Description   Final    RIGHT ANTECUBITAL Performed at [facility] [address], [place], [place] [address]     Special Requests   Final    BOTTLES DRAWN AEROBIC AND ANAEROBIC Blood Culture results may not be optimal due to an inadequate volume of blood received in culture bottles Performed at [facility] [address], [place], [place] [address]    Culture   Final    NO GROWTH 1 DAY Performed at [facility], [place] [address] [place], [place] [address]    Report Status PENDING  Incomplete  MRSA PCR Screening     Status: None   Collection Time: 01/05/19  4:48 PM   Specimen: Nasopharyngeal  Result Value Ref Range Status   MRSA by PCR NEGATIVE NEGATIVE Final  Comment:        The GeneXpert MRSA Assay (FDA approved for NASAL specimens only), is one component of a comprehensive MRSA colonization surveillance program. It is not intended to diagnose MRSA infection nor to guide or monitor treatment for MRSA infections. Performed at [facility], [place] [address], [place], [place] [address]          Radiology Studies: Dg Chest Port 1 View  Result Date: 01/05/2019 CLINICAL DATA:  Worsening shortness of breath and difficulty breathing. Poor oxygen saturation. EXAM: PORTABLE CHEST 1 VIEW COMPARISON:  CT 12/24/2018 FINDINGS: Heart and mediastinal shadows are normal. New bilateral patchy pulmonary infiltrates which could be due to bacterial or viral pneumonia. No evidence of heart failure or effusion. No bone abnormality. IMPRESSION: New widespread pulmonary infiltrates which could be due to bacterial or viral pneumonia, including coronavirus. Electronically Signed   By: Nelson Chimes M.D.   On: 01/05/2019 10:27        Scheduled Meds: . Chlorhexidine Gluconate Cloth  6 each Topical Daily  . enoxaparin (LOVENOX) injection  40 mg Subcutaneous Q12H  . insulin aspart  0-20 Units Subcutaneous TID WC  . insulin aspart  0-5 Units Subcutaneous QHS  . insulin glargine  15 Units Subcutaneous Daily  . mouth rinse  15 mL Mouth Rinse BID  . methylPREDNISolone  (SOLU-MEDROL) injection  80 mg Intravenous Q12H   Continuous Infusions: . sodium chloride    . azithromycin 500 mg (01/06/19 0931)  . cefTRIAXone (ROCEPHIN)  IV 2 g (01/06/19 0904)  . remdesivir 100 mg in NS 250 mL 100 mg (01/06/19 1628)     LOS: 2 days    Time spent: 35 minutes     Edwin Dada, MD [facility] 01/07/2019, 8:12 AM     Please page through [facility] [place]:  [email or web address] Password [email or web address] If 7PM-7AM, please contact night-coverage

## 2019-01-07 NOTE — Plan of Care (Signed)
  Problem: Education: Goal: Knowledge of General Education information will improve Description: Including pain rating scale, medication(s)/side effects and non-pharmacologic comfort measures Outcome: Progressing   Problem: Health Behavior/Discharge Planning: Goal: Ability to manage health-related needs will improve Outcome: Progressing   Problem: Clinical Measurements: Goal: Ability to maintain clinical measurements within normal limits will improve Outcome: Progressing Goal: Will remain free from infection Outcome: Progressing Goal: Diagnostic test results will improve Outcome: Progressing Goal: Respiratory complications will improve Outcome: Progressing   Problem: Activity: Goal: Risk for activity intolerance will decrease Outcome: Progressing   Problem: Nutrition: Goal: Adequate nutrition will be maintained Outcome: Progressing   Problem: Coping: Goal: Level of anxiety will decrease Outcome: Progressing   Problem: Elimination: Goal: Will not experience complications related to bowel motility Outcome: Progressing Goal: Will not experience complications related to urinary retention Outcome: Progressing   Problem: Pain Managment: Goal: General experience of comfort will improve Outcome: Progressing   Problem: Clinical Measurements: Goal: Cardiovascular complication will be avoided Outcome: Completed/Met

## 2019-01-08 LAB — CBC WITH DIFFERENTIAL/PLATELET
Abs Immature Granulocytes: 0.02 10*3/uL (ref 0.00–0.07)
Basophils Absolute: 0 10*3/uL (ref 0.0–0.1)
Basophils Relative: 0 %
Eosinophils Absolute: 0 10*3/uL (ref 0.0–0.5)
Eosinophils Relative: 0 %
HCT: 43.6 % (ref 39.0–52.0)
Hemoglobin: 14.7 g/dL (ref 13.0–17.0)
Immature Granulocytes: 1 %
Lymphocytes Relative: 17 %
Lymphs Abs: 0.7 10*3/uL (ref 0.7–4.0)
MCH: 29.1 pg (ref 26.0–34.0)
MCHC: 33.7 g/dL (ref 30.0–36.0)
MCV: 86.3 fL (ref 80.0–100.0)
Monocytes Absolute: 0.3 10*3/uL (ref 0.1–1.0)
Monocytes Relative: 7 %
Neutro Abs: 2.8 10*3/uL (ref 1.7–7.7)
Neutrophils Relative %: 75 %
Platelets: 238 10*3/uL (ref 150–400)
RBC: 5.05 MIL/uL (ref 4.22–5.81)
RDW: 12.3 % (ref 11.5–15.5)
WBC: 3.8 10*3/uL — ABNORMAL LOW (ref 4.0–10.5)
nRBC: 0 % (ref 0.0–0.2)

## 2019-01-08 LAB — D-DIMER, QUANTITATIVE: D-Dimer, Quant: 0.83 ug/mL-FEU — ABNORMAL HIGH (ref 0.00–0.50)

## 2019-01-08 LAB — GLUCOSE, CAPILLARY
Glucose-Capillary: 85 mg/dL (ref 70–99)
Glucose-Capillary: 234 mg/dL — ABNORMAL HIGH (ref 70–99)
Glucose-Capillary: 297 mg/dL — ABNORMAL HIGH (ref 70–99)
Glucose-Capillary: 209 mg/dL — ABNORMAL HIGH (ref 70–99)

## 2019-01-08 LAB — COMPREHENSIVE METABOLIC PANEL
ALT: 73 U/L — ABNORMAL HIGH (ref 0–44)
AST: 43 U/L — ABNORMAL HIGH (ref 15–41)
Albumin: 2.3 g/dL — ABNORMAL LOW (ref 3.5–5.0)
Alkaline Phosphatase: 85 U/L (ref 38–126)
Anion gap: 10 (ref 5–15)
BUN: 20 mg/dL (ref 6–20)
CO2: 21 mmol/L — ABNORMAL LOW (ref 22–32)
Calcium: 7.7 mg/dL — ABNORMAL LOW (ref 8.9–10.3)
Chloride: 109 mmol/L (ref 98–111)
Creatinine, Ser: 0.66 mg/dL (ref 0.61–1.24)
GFR calc Af Amer: >60 mL/min (ref >60)
GFR calc non Af Amer: >60 mL/min (ref >60)
Glucose, Bld: 91 mg/dL (ref 70–99)
Potassium: 3.5 mmol/L (ref 3.5–5.1)
Sodium: 140 mmol/L (ref 135–145)
Total Bilirubin: 0.4 mg/dL (ref 0.3–1.2)
Total Protein: 5.9 g/dL — ABNORMAL LOW (ref 6.5–8.1)

## 2019-01-08 LAB — FERRITIN: Ferritin: 706 ng/mL — ABNORMAL HIGH (ref 24–336)

## 2019-01-08 LAB — C-REACTIVE PROTEIN: CRP: 1 mg/dL — ABNORMAL HIGH (ref <1.0)

## 2019-01-08 LAB — LACTATE DEHYDROGENASE: LDH: 361 U/L — ABNORMAL HIGH (ref 98–192)

## 2019-01-08 LAB — PROCALCITONIN: Procalcitonin: 0.37 ng/mL

## 2019-01-08 MED ORDER — INSULIN DETEMIR 100 UNIT/ML ~~LOC~~ SOLN
0.1500 [IU]/kg | Freq: Every day | SUBCUTANEOUS | Status: DC
  Administered 2019-01-08: 13 [IU] via SUBCUTANEOUS
  Filled 2019-01-08 (×2): qty 0.13

## 2019-01-08 MED ORDER — LIVING WELL WITH DIABETES BOOK - IN SPANISH
Freq: Once | Status: AC
  Administered 2019-01-08: 11:00:00
  Filled 2019-01-08: qty 1

## 2019-01-08 MED ORDER — SALINE SPRAY 0.65 % NA SOLN
1.0000 | NASAL | Status: DC | PRN
  Filled 2019-01-08: qty 44

## 2019-01-08 MED ORDER — ENOXAPARIN SODIUM 40 MG/0.4ML ~~LOC~~ SOLN
40.0000 mg | SUBCUTANEOUS | Status: DC
  Administered 2019-01-09 – 2019-01-10 (×2): 40 mg via SUBCUTANEOUS
  Filled 2019-01-08 (×2): qty 0.4

## 2019-01-08 MED ORDER — BENZONATATE 100 MG PO CAPS
200.0000 mg | ORAL_CAPSULE | Freq: Three times a day (TID) | ORAL | Status: DC | PRN
  Administered 2019-01-08 – 2019-01-10 (×5): 200 mg via ORAL
  Filled 2019-01-08 (×5): qty 2

## 2019-01-08 NOTE — Progress Notes (Signed)
PROGRESS NOTE    Oscar Mccarty  EPP:[id number] DOB: Apr 01, 1966 DOA: 01/05/2019 PCP: Enid Skeens., MD      Brief Narrative:  Oscar Mccarty is a 53 y.o. M with recently diagnosed cancer, not yet surgery or started therapy, as well as DM who presented with 1 week malaise, progressed to shortness of breath.  In the ER, chest x-ray with bilateral infiltrates, hypoxic on room air.     Assessment & Plan:  Coronavirus pneumonitis with acute hypoxic respiratory failure In setting of ongoing 2020 COVID-19 pandemic.  S/p Actemra 6/15 S/p remdesivir start 6/15  Transferred OO ICU 6/16.   Inflammatory markers trending down.  O2 needsdown to 9L yesterday.  Afebrile. -Continue remdesivir, day 4 of 5 -Continue   -Continue VTE PPx with Lovenox -Continue Zinc and Vitamin C -Daily d-dimer, ferritin and CRP    COVID-19 Labs Recent Labs    01/06/19 0543 01/07/19 0522 01/08/19 0400  DDIMER 1.01* 0.68* 0.83*  FERRITIN 1,245* 972* 706*  LDH  --  397* 361*  CRP 8.3* 2.9* 1.0*    Elevated procalcitonin Possible pneumonia Blood cultures negative at 24 hours -Stop ceftriaone and azithromycin  Diabetes Recently diagnosed.  A1c 9.2%.  Glucoses improved this morning. -Continue Levemir, reduce dose -Continue sliding scale corrections  Colon cancer Needs follow-up with oncology and general surgery as an outpatient. -Will forward discharge summary to Oncologist  Hyponatremia Resolved       MDM and disposition: The below labs and imaging reports were reviewed and summarized above.  Medication management as above.  The patient was admitted with severe acute hypoxic respiratory failure due to COVID-19.    He is stable but still requiring high levels of supplemental O2 by high flow nasal cannula.  Hopefully he will be weaned down slowly over the next 2-3 days and home within 1 week.       DVT prophylaxis: Lovenox Code Status: FULL Family Communication: Attempted again  by phone, no answer    Consultants:   CCM  Procedures:   None  Antimicrobials:   Ceftriaxone 6/15 >> 6/18  Azithromycin  6/15 >> 6/18  Culture data:   6/15 blood culture x2 -- NG at 48 hours       Subjective: All history collected via video phonic interpreter.  No fever overnight, no confusion.  Still weak and dizzy and coughing with exertion, comfortable at rest.  No hemoptysis, leg swelling.  No chest pain.       Objective: Vitals:   01/07/19 1627 01/07/19 1957 01/08/19 0331 01/08/19 0742  BP: (!) 141/79 127/62 (!) 91/48 112/70  Pulse: (!) 55 (!) 56 (!) 59 (!) 58  Resp: 16 20  16   Temp: 98.4 F (36.9 C) 98 F (36.7 C) 98.7 F (37.1 C) 98.7 F (37.1 C)  TempSrc: Oral Oral Oral Oral  SpO2: 94% 95% 97% 94%  Height:        Intake/Output Summary (Last 24 hours) at 01/08/2019 0800 Last data filed at 01/07/2019 1849 Gross per 24 hour  Intake 1120 ml  Output 550 ml  Net 570 ml   There were no vitals filed for this visit.  Examination: General appearance: Adult male, no acute distress, sitting in recliner, interactive.   HEENT: Anicteric, conjunctival pink, lids lashes normal.  No nasal deformity, discharge, or epistaxis.  Lips moist, dentition normal, oropharynx moist, hearing normal.   Skin: Warm and dry.  no jaundice.  No suspicious rashes or lesions. Cardiac: Regular rate and rhythm,  no murmurs, JVP normal, no lower extremity edema. Respiratory: Coughing frequently, dyspneic with normal exertion but diminished throughout. Abdomen: Abdomen soft, no tenderness to palpation, no ascites or distention. MSK: No deformities or effusions. Neuro: Alert, extraocular movements intact, moves all extremities with normal strength and coordination, speech fluent.    Psych: Sensorium intact and responding to questions, attention normal, affect normal, judgment and insight appear normal.      Data Reviewed: I have personally reviewed following labs and imaging  studies:  CBC: Recent Labs  Lab 01/05/19 0818 01/07/19 0522 01/08/19 0400  WBC 5.4 5.0 3.8*  NEUTROABS 4.3 4.1 2.8  HGB 15.5 14.6 14.7  HCT 45.3 41.8 43.6  MCV 85.0 84.6 86.3  PLT 163 220 778   Basic Metabolic Panel: Recent Labs  Lab 01/05/19 0818 01/07/19 0522 01/08/19 0400  NA 132* 139 140  K 3.4* 3.9 3.5  CL 101 107 109  CO2 21* 22 21*  GLUCOSE 234* 302* 91  BUN 14 22* 20  CREATININE 0.83 0.66 0.66  CALCIUM 7.9* 8.0* 7.7*   GFR: Estimated Creatinine Clearance: 115.8 mL/min (by C-G formula based on SCr of 0.66 mg/dL). Liver Function Tests: Recent Labs  Lab 01/05/19 0818 01/07/19 0522 01/08/19 0400  AST 55* 58* 43*  ALT 54* 85* 73*  ALKPHOS 123 98 85  BILITOT 0.8 0.6 0.4  PROT 7.3 6.5 5.9*  ALBUMIN 2.8* 2.6* 2.3*   No results for input(s): LIPASE, AMYLASE in the last 168 hours. No results for input(s): AMMONIA in the last 168 hours. Coagulation Profile: No results for input(s): INR, PROTIME in the last 168 hours. Cardiac Enzymes: No results for input(s): CKTOTAL, CKMB, CKMBINDEX, TROPONINI in the last 168 hours. BNP (last 3 results) No results for input(s): PROBNP in the last 8760 hours. HbA1C: No results for input(s): HGBA1C in the last 72 hours. CBG: Recent Labs  Lab 01/06/19 2101 01/07/19 0755 01/07/19 1150 01/07/19 1626 01/07/19 2026  GLUCAP 404* 294* 317* 215* 240*   Lipid Profile: No results for input(s): CHOL, HDL, LDLCALC, TRIG, CHOLHDL, LDLDIRECT in the last 72 hours. Thyroid Function Tests: No results for input(s): TSH, T4TOTAL, FREET4, T3FREE, THYROIDAB in the last 72 hours. Anemia Panel: Recent Labs    01/07/19 0522 01/08/19 0400  FERRITIN 972* 706*   Urine analysis: No results found for: COLORURINE, APPEARANCEUR, LABSPEC, PHURINE, GLUCOSEU, HGBUR, BILIRUBINUR, KETONESUR, PROTEINUR, UROBILINOGEN, NITRITE, LEUKOCYTESUR Sepsis Labs: @LABRCNTIP (procalcitonin:4,lacticacidven:4)  ) Recent Results (from the past 240 hour(s))   Culture, blood (Routine X 2) w Reflex to ID Panel     Status: None (Preliminary result)   Collection Time: 01/05/19  8:18 AM   Specimen: Left Antecubital; Blood  Result Value Ref Range Status   Specimen Description   Final    LEFT ANTECUBITAL Performed at [facility] [address] [place], [place] [address]    Special Requests   Final    BOTTLES DRAWN AEROBIC AND ANAEROBIC Blood Culture results may not be optimal due to an excessive volume of blood received in culture bottles Performed at [facility] [address] [place], [place] [address]    Culture   Final    NO GROWTH 2 DAYS Performed at [facility] [address], [place],  [address]    Report Status PENDING  Incomplete  SARS Coronavirus 2 (CEPHEID- Performed in [facility] hospital lab), Hosp Order     Status: Abnormal   Collection Time: 01/05/19  8:18 AM   Specimen:  Nasopharyngeal Swab  Result Value Ref Range Status   SARS Coronavirus 2 POSITIVE (A) NEGATIVE Final    Comment: RESULT CALLED TO, READ BACK BY AND VERIFIED WITH: L.VAUGHN RN AT 1048 ON 01/05/2019 BY S.VANHOORNE (NOTE) If result is NEGATIVE SARS-CoV-2 target nucleic acids are NOT DETECTED. The SARS-CoV-2 RNA is generally detectable in upper and lower  respiratory specimens during the acute phase of infection. The lowest  concentration of SARS-CoV-2 viral copies this assay can detect is 250  copies / mL. A negative result does not preclude SARS-CoV-2 infection  and should not be used as the sole basis for treatment or other  patient management decisions.  A negative result may occur with  improper specimen collection / handling, submission of specimen other  than nasopharyngeal swab, presence of viral mutation(s) within the  areas targeted by this assay, and inadequate number of viral copies  (<250 copies / mL). A negative result must be combined with clinical  observations, patient history, and  epidemiological information. If result is POSITIVE SARS-CoV-2 target nucleic acids are  DETECTED. The SARS-CoV-2 RNA is generally detectable in upper and lower  respiratory specimens during the acute phase of infection.  Positive  results are indicative of active infection with SARS-CoV-2.  Clinical  correlation with patient history and other diagnostic information is  necessary to determine patient infection status.  Positive results do  not rule out bacterial infection or co-infection with other viruses. If result is PRESUMPTIVE POSTIVE SARS-CoV-2 nucleic acids MAY BE PRESENT.   A presumptive positive result was obtained on the submitted specimen  and confirmed on repeat testing.  While 2019 novel coronavirus  (SARS-CoV-2) nucleic acids may be present in the submitted sample  additional confirmatory testing may be necessary for epidemiological  and / or clinical management purposes  to differentiate between  SARS-CoV-2 and other Sarbecovirus currently known to infect humans.  If clinically indicated additional testing with an alternate test  methodology  [phone]) is advised. The SARS-CoV-2 RNA is generally  detectable in upper and lower respiratory specimens during the acute  phase of infection. The expected result is Negative. Fact Sheet for Patients:  StrictlyIdeas.no Fact Sheet for Healthcare Providers: BankingDealers.co.za This test is not yet approved or cleared by the [place] FDA and has been authorized for detection and/or diagnosis of SARS-CoV-2 by FDA under an Emergency Use Authorization (EUA).  This EUA will remain in effect (meaning this test can be used) for the duration of the COVID-19 declaration under Section 564(b)(1) of the Act, 21 U.S.C. section 360bbb-3(b)(1), unless the authorization is terminated or revoked sooner. Performed at [facility], [place] [address] [place], [place] [address]    Culture, blood (Routine X 2) w Reflex to ID Panel     Status: None (Preliminary result)   Collection Time: 01/05/19  8:23 AM   Specimen: Right Antecubital; Blood  Result Value Ref Range Status   Specimen Description   Final    RIGHT ANTECUBITAL Performed at [facility] [address], [place], [place] [address]    Special Requests   Final    BOTTLES DRAWN AEROBIC AND ANAEROBIC Blood Culture results may not be optimal due to an inadequate volume of blood received in culture bottles Performed at [facility] [address], [place], [place] [address]    Culture   Final    NO GROWTH 2 DAYS Performed at [facility] [address] [place], [place] [address]  Report Status PENDING  Incomplete  MRSA PCR Screening     Status: None   Collection Time: 01/05/19  4:48 PM   Specimen: Nasopharyngeal  Result Value Ref Range Status   MRSA by PCR NEGATIVE NEGATIVE Final    Comment:        The GeneXpert MRSA Assay (FDA approved for NASAL specimens only), is one component of a comprehensive MRSA colonization surveillance program. It is not intended to diagnose MRSA infection nor to guide or monitor treatment for MRSA infections. Performed at [facility], [place] [address], [place], [place] [address]          Radiology Studies: No results found.      Scheduled Meds: . Chlorhexidine Gluconate Cloth  6 each Topical Daily  . enoxaparin (LOVENOX) injection  40 mg Subcutaneous Q12H  . insulin aspart  0-20 Units Subcutaneous TID WC  . insulin aspart  0-5 Units Subcutaneous QHS  . insulin detemir  0.15 Units/kg Subcutaneous BID  . mouth rinse  15 mL Mouth Rinse BID  . methylPREDNISolone (SOLU-MEDROL) injection  40 mg Intravenous Daily   Continuous Infusions: . sodium chloride    . azithromycin 500 mg (01/07/19 0924)  . cefTRIAXone (ROCEPHIN)  IV 2 g (01/07/19 1032)  . remdesivir 100 mg in NS 250 mL 100 mg (01/07/19  1713)     LOS: 3 days    Time spent: 5 minutes     Edwin Dada, MD [facility] 01/08/2019, 8:00 AM     Please page through [place]:  [email or web address] Password [email or web address] If 7PM-7AM, please contact night-coverage

## 2019-01-08 NOTE — Progress Notes (Signed)
Inpatient Diabetes Program Recommendations  AACE/ADA: New Consensus Statement on Inpatient Glycemic Control (2015)  Target Ranges:  Prepandial:   less than 140 mg/dL      Peak postprandial:   less than 180 mg/dL (1-2 hours)      Critically ill patients:  140 - 180 mg/dL   Lab Results  Component Value Date   GLUCAP 85 01/08/2019   HGBA1C 9.2 (H) 12/25/2018    Review of Glycemic Control  Diabetes history: New-onset Outpatient Diabetes medications: None Current orders for Inpatient glycemic control: Levemir 13 units bid, Novolog 0-20 units tidwc and hs  On Solumedrol 40 mg QD HgbA1C - 9.2% Post-prandials elevated - needs meal coverage insulin  Inpatient Diabetes Program Recommendations:     Add Novolog 4 units tidwc for meal coverage insulin. Do not give if pt eats < 50% meal  Will discuss new onset DM, HgbA1C, monitoring, insulin administration, importance of f/u with PCP, hypoglycemia with pt using [employer] interpreter this morning.   Will benefit from OP Diabetes Education after discharge.  Thank you. Lorenda Peck, RD, LDN, CDE Inpatient Diabetes Coordinator [phone]

## 2019-01-08 NOTE — Plan of Care (Signed)
  Problem: Education: Goal: Knowledge of General Education information will improve Description: Including pain rating scale, medication(s)/side effects and non-pharmacologic comfort measures Outcome: Progressing   Problem: Health Behavior/Discharge Planning: Goal: Ability to manage health-related needs will improve Outcome: Progressing   Problem: Clinical Measurements: Goal: Ability to maintain clinical measurements within normal limits will improve Outcome: Progressing Goal: Will remain free from infection Outcome: Progressing Goal: Diagnostic test results will improve Outcome: Progressing Goal: Respiratory complications will improve Outcome: Progressing   Problem: Activity: Goal: Risk for activity intolerance will decrease Outcome: Progressing   Problem: Nutrition: Goal: Adequate nutrition will be maintained Outcome: Progressing   Problem: Coping: Goal: Level of anxiety will decrease Outcome: Progressing   

## 2019-01-09 LAB — CBC WITH DIFFERENTIAL/PLATELET
Abs Immature Granulocytes: 0.04 10*3/uL (ref 0.00–0.07)
Basophils Absolute: 0 10*3/uL (ref 0.0–0.1)
Basophils Relative: 0 %
Eosinophils Absolute: 0.1 10*3/uL (ref 0.0–0.5)
Eosinophils Relative: 2 %
HCT: 45 % (ref 39.0–52.0)
Hemoglobin: 15 g/dL (ref 13.0–17.0)
Immature Granulocytes: 1 %
Lymphocytes Relative: 18 %
Lymphs Abs: 0.8 10*3/uL (ref 0.7–4.0)
MCH: 28.4 pg (ref 26.0–34.0)
MCHC: 33.3 g/dL (ref 30.0–36.0)
MCV: 85.2 fL (ref 80.0–100.0)
Monocytes Absolute: 0.4 10*3/uL (ref 0.1–1.0)
Monocytes Relative: 9 %
Neutro Abs: 2.9 10*3/uL (ref 1.7–7.7)
Neutrophils Relative %: 70 %
Platelets: 269 10*3/uL (ref 150–400)
RBC: 5.28 MIL/uL (ref 4.22–5.81)
RDW: 12.1 % (ref 11.5–15.5)
WBC: 4.2 10*3/uL (ref 4.0–10.5)
nRBC: 0 % (ref 0.0–0.2)

## 2019-01-09 LAB — COMPREHENSIVE METABOLIC PANEL
ALT: 162 U/L — ABNORMAL HIGH (ref 0–44)
AST: 74 U/L — ABNORMAL HIGH (ref 15–41)
Albumin: 2.5 g/dL — ABNORMAL LOW (ref 3.5–5.0)
Alkaline Phosphatase: 88 U/L (ref 38–126)
Anion gap: 7 (ref 5–15)
BUN: 16 mg/dL (ref 6–20)
CO2: 23 mmol/L (ref 22–32)
Calcium: 7.8 mg/dL — ABNORMAL LOW (ref 8.9–10.3)
Chloride: 108 mmol/L (ref 98–111)
Creatinine, Ser: 0.64 mg/dL (ref 0.61–1.24)
GFR calc Af Amer: >60 mL/min (ref >60)
GFR calc non Af Amer: >60 mL/min (ref >60)
Glucose, Bld: 82 mg/dL (ref 70–99)
Potassium: 3.5 mmol/L (ref 3.5–5.1)
Sodium: 138 mmol/L (ref 135–145)
Total Bilirubin: 0.5 mg/dL (ref 0.3–1.2)
Total Protein: 6 g/dL — ABNORMAL LOW (ref 6.5–8.1)

## 2019-01-09 LAB — GLUCOSE, CAPILLARY
Glucose-Capillary: 125 mg/dL — ABNORMAL HIGH (ref 70–99)
Glucose-Capillary: 199 mg/dL — ABNORMAL HIGH (ref 70–99)
Glucose-Capillary: 255 mg/dL — ABNORMAL HIGH (ref 70–99)
Glucose-Capillary: 167 mg/dL — ABNORMAL HIGH (ref 70–99)

## 2019-01-09 LAB — D-DIMER, QUANTITATIVE: D-Dimer, Quant: 1.27 ug/mL-FEU — ABNORMAL HIGH (ref 0.00–0.50)

## 2019-01-09 LAB — C-REACTIVE PROTEIN: CRP: <0.8 mg/dL (ref <1.0)

## 2019-01-09 LAB — LACTATE DEHYDROGENASE: LDH: 365 U/L — ABNORMAL HIGH (ref 98–192)

## 2019-01-09 LAB — FERRITIN: Ferritin: 786 ng/mL — ABNORMAL HIGH (ref 24–336)

## 2019-01-09 MED ORDER — INSULIN DETEMIR 100 UNIT/ML ~~LOC~~ SOLN
9.0000 [IU] | Freq: Every day | SUBCUTANEOUS | Status: DC
  Administered 2019-01-09 – 2019-01-10 (×2): 9 [IU] via SUBCUTANEOUS
  Filled 2019-01-09 (×2): qty 0.09

## 2019-01-09 NOTE — TOC Initial Note (Signed)
Transition of Care [facility]) - Initial/Assessment Note    Patient Details  Name: Oscar Mccarty MRN: [id number] Date of Birth: 04-Aug-1965  Transition of [facility] CM/SW Contact:    Midge Minium RN, BSN, NCM-BC, ACM-RN [phone] (working remotel) Phone Number: 01/09/2019, 4:58 PM  Clinical Narrative:                 CM attempted to contact the patient via phone in his hospital room utilizing [facility] (ID# [phone]), with no answer. CM contacted the mobile number listed in Epic, with the patients spouse agreeable to discuss the POC for home oxygen. Spouse stated the patient lives at home and was independent with his ADLs PTA. Patient has BCBS insurance/an established PCP. Per Dr. Loleta Books, home oxygen will be needed at discharge. DME preference provided, with [employer] selected. Learta Codding [facility] liaison) informed of the referral; AVS updated. Spouse stated she will be home and available for delivery, and available to provide transportation at discharge. Patient will be provided with a portable oxygen tank, thermometer and pulse ox device prior to him transitioning home. CM team will continue to follow.    Expected Discharge Plan: Home/Self Care Barriers to Discharge: Continued Medical Work up   Patient Goals and CMS Choice Patient states their goals for this hospitalization and ongoing recovery are:: "to return home" CMS Medicare.gov Compare Post Acute Care list provided to:: Patient Choice offered to / list presented to : Patient  Expected Discharge Plan and Services Expected Discharge Plan: Home/Self Care In-house Referral: NA Discharge Planning Services: CM Consult Post Acute Care Choice: Durable Medical Equipment Living arrangements for the past 2 months: Single Family Home Expected Discharge Date: (unknown)               DME Arranged: Oxygen DME Agency: [facility] Date DME Agency Contacted: 01/09/19 Time DME Agency Contacted: [phone] Representative spoke with at DME  Agency: Le[facility] Codding liaison   [facility] Agency: NA        Prior Living Arrangements/Services Living arrangements for the past 2 months: [place] with:: Self, Spouse Patient language and need for interpreter reviewed:: Yes Do you feel safe going back to the place where you live?: Yes      Need for Family Participation in Patient Care: Yes (Comment) Care giver support system in place?: Yes (comment)   Criminal Activity/Legal Involvement Pertinent to Current Situation/Hospitalization: Yes - Comment as needed  Activities of Daily Living Home Assistive Devices/Equipment: Eyeglasses ADL Screening (condition at time of admission) Patient's cognitive ability adequate to safely complete daily activities?: Yes Is the patient deaf or have difficulty hearing?: No Does the patient have difficulty seeing, even when wearing glasses/contacts?: No Does the patient have difficulty concentrating, remembering, or making decisions?: No Patient able to express need for assistance with ADLs?: Yes Does the patient have difficulty dressing or bathing?: No Independently performs ADLs?: Yes (appropriate for developmental age) Does the patient have difficulty walking or climbing stairs?: No Weakness of Legs: Both Weakness of Arms/Hands: None  Permission Sought/Granted Permission sought to share information with : Case Manager, Chartered certified accountant granted to share information with : Yes, Verbal Permission Granted     Permission granted to share info w AGENCY: [employer]        Emotional Assessment   Attitude/Demeanor/Rapport: Gracious Affect (typically observed): Appropriate, Stable, Pleasant Orientation: : Oriented to Self, Oriented to Place, Oriented to  Time, Oriented to Situation Alcohol / Substance Use: Not Applicable Psych Involvement: No (comment)  Admission diagnosis:  Community acquired pneumonia, unspecified laterality [J18.9] Patient Active Problem List    Diagnosis Date Noted  . Acute respiratory disease due to COVID-19 virus 01/05/2019  . Diabetes mellitus, type 2 ([place]) 12/25/2018  . Adenocarcinoma of sigmoid colon ([place]) 12/22/2018  . Bronchitis 09/17/2011  . Allergic rhinitis 12/12/2010  . Chronic headaches 12/12/2010  . Depression 12/12/2010  . Cough 12/12/2010   PCP:  Enid Skeens., MD Pharmacy:   [facility], [place] [address] Phone: [phone] Fax: [phone]     Social Determinants of Health (SDOH) Interventions    Readmission Risk Interventions No flowsheet data found.

## 2019-01-09 NOTE — Plan of Care (Signed)
  Problem: Education: Goal: Knowledge of General Education information will improve Description: Including pain rating scale, medication(s)/side effects and non-pharmacologic comfort measures Outcome: Progressing   Problem: Health Behavior/Discharge Planning: Goal: Ability to manage health-related needs will improve Outcome: Progressing   Problem: Clinical Measurements: Goal: Diagnostic test results will improve Outcome: Progressing   Problem: Clinical Measurements: Goal: Respiratory complications will improve Outcome: Progressing   Problem: Activity: Goal: Risk for activity intolerance will decrease Outcome: Progressing   Problem: Coping: Goal: Level of anxiety will decrease Outcome: Progressing

## 2019-01-09 NOTE — Progress Notes (Signed)
Spoke with patient's daughter Anderson [place], and updated.

## 2019-01-09 NOTE — Progress Notes (Signed)
PROGRESS NOTE    Oscar Mccarty  HYW:[id number] DOB: [birth year] DOA: 01/05/2019 PCP: Enid Skeens., MD      Brief Narrative:  Oscar Mccarty is a 53 y.o. M with recently diagnosed cancer, not yet surgery or started therapy, as well as DM who presented with 1 week malaise, progressed to shortness of breath.  In the ER, chest x-ray with bilateral infiltrates, hypoxic on room air.     Assessment & Plan:  Coronavirus pneumonitis with acute hypoxic respiratory failure In setting of ongoing 2020 COVID-19 pandemic.  S/p Actemra 6/15 S/p remdesivir start 6/15  Transferred OO ICU 6/16.   CRP trending down.  Other markers stable.  O2 needs trending down to 2L today.     Afebrile -Continue remdesivir, day 5 of 5 -Continue steroids, day 5 of 5  -Continue VTE PPx with Lovenox -Continue Zinc and Vitamin C -Daily d-dimer, ferritin and CRP    COVID-19 Labs Recent Labs    01/07/19 0522 01/08/19 0400 01/09/19 0300  DDIMER 0.68* 0.83* 1.27*  FERRITIN 972* 706* 786*  LDH 397* 361* 365*  CRP 2.9* 1.0* <0.8    Elevated procalcitonin Possible pneumonia Blood cultures NGTD.  No fever since stopping antibiotics.  Still improving.  Diabetes Recently diagnosed.  A1c 9.2%.  AM glucose low, elevated post-prandials. -Continue Levemir, reduce dose again -Continue SSI corrections  Colon cancer Needs follow-up with oncology and general surgery as an outpatient. -Will forward discharge summary to Oncologist  Hyponatremia Resolved       MDM and disposition: The below labs and imaging reports were reviewed and summarized above.  Medication management as above.  The patient was admitted with severe acute hypoxic respiratory failure due to COVID-19.    He still reqquires supplemental O2 but hopefully will be completed remdesivir tomorrow and able to wean to reasonable level of home O2 for discahrge tomorrow or Sunday.       DVT prophylaxis: Lovenox Code Status: FULL  Family Communication: Daughter by phone    Consultants:   CCM  Procedures:   None  Antimicrobials:   Ceftriaxone 6/15 >> 6/18  Azithromycin  6/15 >> 6/18  Culture data:   6/15 blood culture x2 -- NG at 48 hours       Subjective: All history collected via video phonic interpreter.  No new fever, no confusion, feels quite a bit better, still very dyspneic and coughing with exertion, quite weak with exertion.  No hemoptysis, leg swelling, chest pain.     Objective: Vitals:   01/08/19 1100 01/08/19 1620 01/08/19 1927 01/09/19 0505  BP:  109/75 130/77 112/68  Pulse: (!) 59 61 (!) 56 (!) 50  Resp:  16    Temp:  98.6 F (37 C) 98.9 F (37.2 C) 99.1 F (37.3 C)  TempSrc:  Oral Oral Oral  SpO2: 96% 90% 92% 95%  Weight:      Height:        Intake/Output Summary (Last 24 hours) at 01/09/2019 0746 Last data filed at 01/08/2019 1813 Gross per 24 hour  Intake 840 ml  Output 1300 ml  Net -460 ml   Filed Weights   01/08/19 0742  Weight: 74.4 kg    Examination: General appearance: Adult male, lying in bed, no acute distress, interactive. HEENT: Anicteric, conjunctival pink, lids lashes normal.  No nasal deformity, discharge, or epistaxis.  Lips moist, dentition normal, oropharynx moist, hearing normal.   Skin: Warm and dry.  no jaundice.  No suspicious rashes or lesions.  Cardiac: Regular rate and rhythm, no murmurs, JVP normal, no lower extremity edema. Respiratory: Dyspneic with exertion, but diminished throughout, no rales or wheezes. Abdomen: Abdomen soft, no tenderness palpation, no ascites or distention. MSK: No deformities or effusions. Neuro: Awake and alert, extraocular movements intact, moves all extremities with normal strength and coordination, speech fluent Psych: Sensorium intact and responding to questions, attention normal, affect normal, judgment and insight appear normal.      Data Reviewed: I have personally reviewed following labs and  imaging studies:  CBC: Recent Labs  Lab 01/05/19 0818 01/07/19 0522 01/08/19 0400 01/09/19 0300  WBC 5.4 5.0 3.8* 4.2  NEUTROABS 4.3 4.1 2.8 2.9  HGB 15.5 14.6 14.7 15.0  HCT 45.3 41.8 43.6 45.0  MCV 85.0 84.6 86.3 85.2  PLT 163 220 238 665   Basic Metabolic Panel: Recent Labs  Lab 01/05/19 0818 01/07/19 0522 01/08/19 0400 01/09/19 0300  NA 132* 139 140 138  K 3.4* 3.9 3.5 3.5  CL 101 107 109 108  CO2 21* 22 21* 23  GLUCOSE 234* 302* 91 82  BUN 14 22* 20 16  CREATININE 0.83 0.66 0.66 0.64  CALCIUM 7.9* 8.0* 7.7* 7.8*   GFR: Estimated Creatinine Clearance: 103.3 mL/min (by C-G formula based on SCr of 0.64 mg/dL). Liver Function Tests: Recent Labs  Lab 01/05/19 0818 01/07/19 0522 01/08/19 0400 01/09/19 0300  AST 55* 58* 43* 74*  ALT 54* 85* 73* 162*  ALKPHOS 123 98 85 88  BILITOT 0.8 0.6 0.4 0.5  PROT 7.3 6.5 5.9* 6.0*  ALBUMIN 2.8* 2.6* 2.3* 2.5*   No results for input(s): LIPASE, AMYLASE in the last 168 hours. No results for input(s): AMMONIA in the last 168 hours. Coagulation Profile: No results for input(s): INR, PROTIME in the last 168 hours. Cardiac Enzymes: No results for input(s): CKTOTAL, CKMB, CKMBINDEX, TROPONINI in the last 168 hours. BNP (last 3 results) No results for input(s): PROBNP in the last 8760 hours. HbA1C: No results for input(s): HGBA1C in the last 72 hours. CBG: Recent Labs  Lab 01/07/19 2026 01/08/19 0741 01/08/19 1132 01/08/19 1618 01/08/19 2025  GLUCAP 240* 85 234* 297* 209*   Lipid Profile: No results for input(s): CHOL, HDL, LDLCALC, TRIG, CHOLHDL, LDLDIRECT in the last 72 hours. Thyroid Function Tests: No results for input(s): TSH, T4TOTAL, FREET4, T3FREE, THYROIDAB in the last 72 hours. Anemia Panel: Recent Labs    01/08/19 0400 01/09/19 0300  FERRITIN 706* 786*   Urine analysis: No results found for: COLORURINE, APPEARANCEUR, LABSPEC, PHURINE, GLUCOSEU, HGBUR, BILIRUBINUR, KETONESUR, PROTEINUR,  UROBILINOGEN, NITRITE, LEUKOCYTESUR Sepsis Labs: @LABRCNTIP (procalcitonin:4,lacticacidven:4)  ) Recent Results (from the past 240 hour(s))  Culture, blood (Routine X 2) w Reflex to ID Panel     Status: None (Preliminary result)   Collection Time: 01/05/19  8:18 AM   Specimen: Left Antecubital; Blood  Result Value Ref Range Status   Specimen Description   Final    LEFT ANTECUBITAL Performed at [facility] [address], [place], [place] [address]    Special Requests   Final    BOTTLES DRAWN AEROBIC AND ANAEROBIC Blood Culture results may not be optimal due to an excessive volume of blood received in culture bottles Performed at [facility] [address] [place], [place] [address]    Culture   Final    NO GROWTH 3 DAYS Performed at [facility], [place] [address], [place], [place] [address]    Report Status PENDING  Incomplete  SARS Coronavirus 2 (CEPHEID- Performed in [facility] hospital lab), Hosp Order     Status: Abnormal   Collection Time: 01/05/19  8:18 AM   Specimen: Nasopharyngeal Swab  Result Value Ref Range Status   SARS Coronavirus 2 POSITIVE (A) NEGATIVE Final    Comment: RESULT CALLED TO, READ BACK BY AND VERIFIED WITH: L.VAUGHN RN AT 1048 ON 01/05/2019 BY S.VANHOORNE (NOTE) If result is NEGATIVE SARS-CoV-2 target nucleic acids are NOT DETECTED. The SARS-CoV-2 RNA is generally detectable in upper and lower  respiratory specimens during the acute phase of infection. The lowest  concentration of SARS-CoV-2 viral copies this assay can detect is 250  copies / mL. A negative result does not preclude SARS-CoV-2 infection  and should not be used as the sole basis for treatment or other  patient management decisions.  A negative result may occur with  improper specimen collection / handling, submission of specimen other  than nasopharyngeal swab, presence of viral mutation(s) within the  areas targeted by this assay, and  inadequate number of viral copies  (<250 copies / mL). A negative result must be combined with clinical  observations, patient history, and epidemiological information. If result is POSITIVE SARS-CoV-2 target nucleic acids are  DETECTED. The SARS-CoV-2 RNA is generally detectable in upper and lower  respiratory specimens during the acute phase of infection.  Positive  results are indicative of active infection with SARS-CoV-2.  Clinical  correlation with patient history and other diagnostic information is  necessary to determine patient infection status.  Positive results do  not rule out bacterial infection or co-infection with other viruses. If result is PRESUMPTIVE POSTIVE SARS-CoV-2 nucleic acids MAY BE PRESENT.   A presumptive positive result was obtained on the submitted specimen  and confirmed on repeat testing.  While 2019 novel coronavirus  (SARS-CoV-2) nucleic acids may be present in the submitted sample  additional confirmatory testing may be necessary for epidemiological  and / or clinical management purposes  to differentiate between  SARS-CoV-2 and other Sarbecovirus currently known to infect humans.  If clinically indicated additional testing with an alternate test  methodology  [phone]) is advised. The SARS-CoV-2 RNA is generally  detectable in upper and lower respiratory specimens during the acute  phase of infection. The expected result is Negative. Fact Sheet for Patients:  StrictlyIdeas.no Fact Sheet for Healthcare Providers: BankingDealers.co.za This test is not yet approved or cleared by the [place] FDA and has been authorized for detection and/or diagnosis of SARS-CoV-2 by FDA under an Emergency Use Authorization (EUA).  This EUA will remain in effect (meaning this test can be used) for the duration of the COVID-19 declaration under Section 564(b)(1) of the Act, 21 U.S.C. section 360bbb-3(b)(1), unless the  authorization is terminated or revoked sooner. Performed at [facility], [place] [address], [place], [place] [address]   Culture, blood (Routine X 2) w Reflex to ID Panel     Status: None (Preliminary result)   Collection Time: 01/05/19  8:23 AM   Specimen: Right Antecubital; Blood  Result Value Ref Range Status   Specimen Description   Final    RIGHT ANTECUBITAL Performed at [facility] [address], [place], [place] [address]    Special Requests   Final    BOTTLES DRAWN AEROBIC AND ANAEROBIC Blood Culture results may not be optimal due to an inadequate volume of blood received in culture bottles Performed at [facility] [address] [place], [place] [address]  Culture   Final    NO GROWTH 3 DAYS Performed at [facility], [place] [address] [place], [place] [address]    Report Status PENDING  Incomplete  MRSA PCR Screening     Status: None   Collection Time: 01/05/19  4:48 PM   Specimen: Nasopharyngeal  Result Value Ref Range Status   MRSA by PCR NEGATIVE NEGATIVE Final    Comment:        The GeneXpert MRSA Assay (FDA approved for NASAL specimens only), is one component of a comprehensive MRSA colonization surveillance program. It is not intended to diagnose MRSA infection nor to guide or monitor treatment for MRSA infections. Performed at [facility], [place] [address], [place], [place] [address]          Radiology Studies: No results found.      Scheduled Meds: . enoxaparin (LOVENOX) injection  40 mg Subcutaneous Q24H  . insulin aspart  0-20 Units Subcutaneous TID WC  . insulin aspart  0-5 Units Subcutaneous QHS  . insulin detemir  0.15 Units/kg Subcutaneous Daily  . mouth rinse  15 mL Mouth Rinse BID  . methylPREDNISolone (SOLU-MEDROL) injection  40 mg Intravenous Daily   Continuous Infusions: . sodium chloride    . remdesivir 100 mg in NS 250 mL 100 mg (01/08/19  1718)     LOS: 4 days    Time spent: 25 minutes     Edwin Dada, MD [facility] 01/09/2019, 7:46 AM     Please page through [place]:  [email or web address] Password [email or web address] If 7PM-7AM, please contact night-coverage

## 2019-01-09 NOTE — Progress Notes (Signed)
Family update: Offered to call patients family with update but states he has already spoke to his wife and his daughter is currently sending FMLA forms over to be signed by doctor prior to d/c. Will con't to monitor.

## 2019-01-09 NOTE — Evaluation (Signed)
Physical Therapy Evaluation Patient Details Name: Oscar Mccarty MRN: [id number] DOB: 25-Dec-1965 Today's Date: 01/09/2019   History of Present Illness  [name] is a 53 y.o. M with recently diagnosed colon cancer, not yet surgery or started therapy, as well as DM who presented 01/05/19 with 1 week malaise, progressed to shortness of breath. +hypoxia +COVID    Clinical Impression  Pt admitted with above diagnosis. Pt currently with functional limitations due to the deficits listed below (see PT Problem List). Patient up walking for the first time and slightly unsteady. Initiated walking on 2L [place] O2 and pt decreased from 91% SaO2 to 87%. With standing rest break and incr O2 to 3L he returned to 89% in 30 seconds. On return walk on 3L lowest SaO2 87%. Did note while pt using BSC (for BM) his SaO2 decr to 80% on 2L with several minutes to return to 88%. Pt will benefit from skilled PT to increase their independence and safety with mobility to allow discharge home.      Follow Up Recommendations No PT follow up    Equipment Recommendations  None recommended by PT    Recommendations for Other Services OT consult     Precautions / Restrictions Precautions Precautions: None      Mobility  Bed Mobility Overal bed mobility: Independent                Transfers Overall transfer level: Independent                  Ambulation/Gait Ambulation/Gait assistance: Min guard Gait Distance (Feet): 30 Feet(seated rest, then 100 ft) Assistive device: None Gait Pattern/deviations: Step-through pattern;Drifts right/left   Gait velocity interpretation: <1.8 ft/sec, indicate of risk for recurrent falls General Gait Details: very slow velocity to maintain SaO2 causing slight imbalance; pt did not need external support to recover  Stairs            Wheelchair Mobility    Modified Rankin (Stroke Patients Only)       Balance Overall balance assessment: Mild deficits observed,  not formally tested                                           Pertinent Vitals/Pain Pain Assessment: No/denies pain    Home Living Family/patient expects to be discharged to:: Private residence Living Arrangements: Spouse/significant other;Children;Other relatives Available Help at Discharge: Family Type of Home: House Home Access: Stairs to enter   Technical brewer of Steps: 3 Home Layout: One level Home Equipment: None      Prior Function Level of Independence: Independent               Hand Dominance        Extremity/Trunk Assessment   Upper Extremity Assessment Upper Extremity Assessment: Generalized weakness    Lower Extremity Assessment Lower Extremity Assessment: Generalized weakness    Cervical / Trunk Assessment Cervical / Trunk Assessment: Normal  Communication   Communication: Prefers language other than English;Interpreter utilized(Juan at [employer])  Cognition Arousal/Alertness: Awake/alert Behavior During Therapy: WFL for tasks assessed/performed Overall Cognitive Status: Within Functional Limits for tasks assessed                                        General Comments General comments (skin integrity,  edema, etc.): Educated in pursed lip breathing and proper use of IS (practiced until pt performed correctly)    Exercises     Assessment/Plan    PT Assessment Patient needs continued PT services  PT Problem List Decreased strength;Decreased activity tolerance;Decreased balance;Decreased mobility;Decreased knowledge of precautions;Cardiopulmonary status limiting activity       PT Treatment Interventions Gait training;Functional mobility training;Therapeutic activities;Therapeutic exercise;Balance training;Patient/family education    PT Goals (Current goals can be found in the Care Plan section)  Acute Rehab PT Goals Patient Stated Goal: to feel stronger when walking PT Goal  Formulation: With patient Time For Goal Achievement: 01/23/19 Potential to Achieve Goals: Good    Frequency Min 3X/week   Barriers to discharge        Co-evaluation               AM-PAC PT "6 Clicks" Mobility  Outcome Measure Help needed turning from your back to your side while in a flat bed without using bedrails?: None Help needed moving from lying on your back to sitting on the side of a flat bed without using bedrails?: None Help needed moving to and from a bed to a chair (including a wheelchair)?: A Little Help needed standing up from a chair using your arms (e.g., wheelchair or bedside chair)?: None Help needed to walk in hospital room?: A Little Help needed climbing 3-5 steps with a railing? : A Little 6 Click Score: 21    End of Session Equipment Utilized During Treatment: Gait belt;Oxygen Activity Tolerance: Treatment limited secondary to medical complications (Comment)(decr SaO2 and feeling weak/tired) Patient left: in chair;with call bell/phone within reach Nurse Communication: Mobility status;Other (comment)(SaO2) PT Visit Diagnosis: Difficulty in walking, not elsewhere classified (R26.2)    Time: 0737-1062 PT Time Calculation (min) (ACUTE ONLY): 29 min   Charges:   PT Evaluation $PT Eval Low Complexity: 1 Low PT Treatments $Gait Training: 8-22 mins          [employer], PT 01/09/2019, 12:09 PM

## 2019-01-10 LAB — COMPREHENSIVE METABOLIC PANEL
ALT: 124 U/L — ABNORMAL HIGH (ref 0–44)
AST: 38 U/L (ref 15–41)
Albumin: 2.7 g/dL — ABNORMAL LOW (ref 3.5–5.0)
Alkaline Phosphatase: 80 U/L (ref 38–126)
Anion gap: 5 (ref 5–15)
BUN: 17 mg/dL (ref 6–20)
CO2: 23 mmol/L (ref 22–32)
Calcium: 7.6 mg/dL — ABNORMAL LOW (ref 8.9–10.3)
Chloride: 105 mmol/L (ref 98–111)
Creatinine, Ser: 0.65 mg/dL (ref 0.61–1.24)
GFR calc Af Amer: >60 mL/min (ref >60)
GFR calc non Af Amer: >60 mL/min (ref >60)
Glucose, Bld: 99 mg/dL (ref 70–99)
Potassium: 3.5 mmol/L (ref 3.5–5.1)
Sodium: 133 mmol/L — ABNORMAL LOW (ref 135–145)
Total Bilirubin: 0.7 mg/dL (ref 0.3–1.2)
Total Protein: 6.1 g/dL — ABNORMAL LOW (ref 6.5–8.1)

## 2019-01-10 LAB — LACTATE DEHYDROGENASE: LDH: 307 U/L — ABNORMAL HIGH (ref 98–192)

## 2019-01-10 LAB — CULTURE, BLOOD (ROUTINE X 2)
Culture: NO GROWTH
Culture: NO GROWTH

## 2019-01-10 LAB — CBC WITH DIFFERENTIAL/PLATELET
Abs Immature Granulocytes: 0.05 10*3/uL (ref 0.00–0.07)
Basophils Absolute: 0 10*3/uL (ref 0.0–0.1)
Basophils Relative: 0 %
Eosinophils Absolute: 0.2 10*3/uL (ref 0.0–0.5)
Eosinophils Relative: 3 %
HCT: 43.4 % (ref 39.0–52.0)
Hemoglobin: 14.7 g/dL (ref 13.0–17.0)
Immature Granulocytes: 1 %
Lymphocytes Relative: 20 %
Lymphs Abs: 1 10*3/uL (ref 0.7–4.0)
MCH: 28.5 pg (ref 26.0–34.0)
MCHC: 33.9 g/dL (ref 30.0–36.0)
MCV: 84.3 fL (ref 80.0–100.0)
Monocytes Absolute: 0.4 10*3/uL (ref 0.1–1.0)
Monocytes Relative: 7 %
Neutro Abs: 3.4 10*3/uL (ref 1.7–7.7)
Neutrophils Relative %: 69 %
Platelets: 282 10*3/uL (ref 150–400)
RBC: 5.15 MIL/uL (ref 4.22–5.81)
RDW: 12.1 % (ref 11.5–15.5)
WBC: 5 10*3/uL (ref 4.0–10.5)
nRBC: 0 % (ref 0.0–0.2)

## 2019-01-10 LAB — FERRITIN: Ferritin: 562 ng/mL — ABNORMAL HIGH (ref 24–336)

## 2019-01-10 LAB — GLUCOSE, CAPILLARY
Glucose-Capillary: 132 mg/dL — ABNORMAL HIGH (ref 70–99)
Glucose-Capillary: 156 mg/dL — ABNORMAL HIGH (ref 70–99)

## 2019-01-10 LAB — C-REACTIVE PROTEIN: CRP: <0.8 mg/dL (ref <1.0)

## 2019-01-10 LAB — D-DIMER, QUANTITATIVE: D-Dimer, Quant: 1.46 ug/mL-FEU — ABNORMAL HIGH (ref 0.00–0.50)

## 2019-01-10 MED ORDER — DEXAMETHASONE 6 MG PO TABS: 6.0000 mg | ORAL_TABLET | Freq: Every day | ORAL | 0 refills | Status: DC

## 2019-01-10 MED ORDER — BENZONATATE 200 MG PO CAPS: 200.0000 mg | ORAL_CAPSULE | Freq: Three times a day (TID) | ORAL | 1 refills | Status: DC | PRN

## 2019-01-10 MED ORDER — METFORMIN HCL 500 MG PO TABS: 500.0000 mg | ORAL_TABLET | Freq: Two times a day (BID) | ORAL | 11 refills | Status: AC

## 2019-01-10 MED ORDER — GLIPIZIDE 5 MG PO TABS: 5.0000 mg | ORAL_TABLET | Freq: Every day | ORAL | 11 refills | Status: DC

## 2019-01-10 NOTE — Progress Notes (Addendum)
Called pts daughter Anderson Malta and updated her on pts status. Informed her we would let her know if pt is discharging today.  Rufina Falco, RN BSN 01/10/2019 10:38 AM

## 2019-01-10 NOTE — Care Management (Signed)
Case manager requested [facility], deliver oxygen tank from [employer] along with pulse ox and thermometer to patient's room. Concentrator has been delivered to patient's home.   Ricki Miller, RN BSN Case Manager [phone]

## 2019-01-10 NOTE — Progress Notes (Addendum)
Pt able to demonstrate checking his blood sugar and administering insulin. Pt denies questions or concerns. Will continue to monitor.  Rufina Falco, RN BSN 01/10/2019 11:58 AM

## 2019-01-10 NOTE — Progress Notes (Addendum)
Called pts daughter Anderson Malta and reviewed discharge instructions with her. Pts daughter has question regarding metformin administration. MD aware. Will continue to monitor.  Rufina Falco, RN BSN 01/10/2019 2:16 PM

## 2019-01-10 NOTE — Discharge Summary (Signed)
Physician Discharge Summary  [name]:[id number] DOB: Aug 23, 1965 DOA: 01/05/2019  PCP: Enid Skeens., MD  Admit date: 01/05/2019 Discharge date: 01/10/2019  Admitted From: Home  Disposition:  Home   Recommendations for Outpatient Follow-up:  1. Follow up with PCP in 1-2 weeks 2. Follow up with Oncology  3. Please obtain BMP in 1 week to follow Cr, Na 4. Please follow up glucoses and repeat HgbA1c in 3 months; adjust anti-diabetics as needed      Home Health: None  Equipment/Devices: Home O2  Discharge Condition: Fair  CODE STATUS: FULL Diet recommendation: Diabetic  Brief/Interim Summary: [name] is a 53 y.o. M with recently diagnosed cancer, not yet surgery or started therapy, as well as DM who presented with 1 week malaise, progressed to shortness of breath.  In the ER, chest x-ray with bilateral infiltrates, hypoxic on room air.     PRINCIPAL HOSPITAL DIAGNOSIS: COVID-19    Discharge Diagnoses:   Coronavirus pneumonitis with acute hypoxic respiratory failure In setting of ongoing 2020 COVID-19 pandemic.  Admitted requiring 30L O2.  Treated with remdesivir and steroids 5 days.  Treated with Actemra.    Weaned to 2L, stable and without severe dyspnea with exertion. -Wean O2 as outpatient    Doubt community acquired bacterial pneumonia Blood cultures NGTD.  No fever since stopping antibiotics.  Still improving.  Diabetes Recently diagnosed.  A1c 9.2%.  -Discharged on metformin, glipizide  Colon cancer Needs follow-up with oncology and general surgery as an outpatient.  Hyponatremia Mild             Discharge Instructions  Discharge Instructions    Diet Carb Modified   Complete by: As directed    Discharge instructions   Complete by: As directed    From Dr. Loleta Books: You were admitted for coronavirus (Also known as COVID-19)  You were treated with steroids and remdesivir.  You completed the course of remdesivir (a  medicine to prevent coronavirus from reproducing) You should finish the course of steroids by taking dexamethasone 6 mg once daily in the morning for 4 more days  If you have any lingering cough, you may take Tessalon (this is the jelly pill that worked well in the hospital. I have written a prescription for Tessalon and it is at [facility]. You could also take over the counter cough syrup, like Robitussin (with the ingredients "GUIAFENESIN" and "DEXTROMETHORPHAN")   Use the oxygen any time your oxygen level is less than 88% Use a pulse oximeter to measure your oxygen level.  If the home health agency doesn't bring you one of these, you can find them at any drug store.  You should remain in self isolation for 10 days from the start of your symptoms AND at least 3 days from your last fever.    You should call Dr. Wendie Agreste for a follow up appointment in 1 week Ask him to check your blood work (I have sent him your discharge summary with specific instructions)   The [facility] Department should make the final decision about when you are safe to return to work.  You have no personal health conditions that require special self-isolation precautions, and so the health department (from the standpoint of COVID) may clear you for work at their sole discretion.  With regard to returning to work, I recommend you stay out of work until you are off oxygen, which should be decided by Dr. Wendie Agreste.  Probably at your first follow up with him.  If you have anyone in the home who has NOT had coronavirus:    -do not be in the same room with them until your self isolation is over    -wear a mask and have them wear a mask if you MUST be in the same room    -clean all hard surfaces (counters, doors, tables) twice a day    -use a separate bathroom at all times    For the new diabetes: Start taking metformin (the medicine for diabetes) Take metformin 500 mg (1 tablet) once daily for 2-3  days Then increase to 500 mg twice daily (once in the morning and once at night) for 2-3 days Keep increasing the dose every 2-3 days until you are taking 1000 mg (2 tabs) in the morning and 2 tabs at night  Also, take glipizide 5mg  (1 tablet) once in the morning. Glipizide is the medicine that will lower your sugar more, so only take one tablet per day until you see Dr. Wendie Agreste and can decide whether to increase the dose or not.  Check your blood sugar every morning. A "normal" blood sugar in the morning when you wake up is between 70 and 120  A "normal" blood sugar after eating is 120 to 180  If your blood sugar is ever less than 70, you might have symptoms of shakes, fatigue, nausea, dizziness.  If this happens, drink some juice and check your sugar again in 15 minutes.  If the symptoms don't go away, go to the ER.  If your sugar is every greater than 400, call your new primary care doctor     Lastly, Call your oncologist and your surgeon to make sure all of your follow up appointments are still able to happen even though you have had coronavirus.   Increase activity slowly   Complete by: As directed    MyChart COVID-19 home monitoring program   Complete by: Jan 10, 2019    Is the patient willing to use the [facility] for home monitoring?: Yes     Allergies as of 01/10/2019   No Known Allergies     Medication List    STOP taking these medications   acetaminophen-codeine 120-12 MG/5ML suspension   loratadine 10 MG tablet Commonly known as: CLARITIN     TAKE these medications   benzonatate 200 MG capsule Commonly known as: TESSALON Take 1 capsule (200 mg total) by mouth 3 (three) times daily as needed for cough (if not relieved with robitussin).   dexamethasone 6 MG tablet Commonly known as: DECADRON Take 1 tablet (6 mg total) by mouth daily.   glipiZIDE 5 MG tablet Commonly known as: GLUCOTROL Take 1 tablet (5 mg total) by mouth daily before breakfast.    metFORMIN 500 MG tablet Commonly known as: Glucophage Take 1 tablet (500 mg total) by mouth 2 (two) times daily with a meal.   ondansetron 8 MG disintegrating tablet Commonly known as: ZOFRAN-ODT Take 8 mg by mouth 3 (three) times daily as needed for nausea/vomiting.   sildenafil 20 MG tablet Commonly known as: REVATIO Take 20 mg by mouth as needed.   SUMAtriptan 50 MG tablet Commonly known as: IMITREX Take 50 mg by mouth every 2 (two) hours as needed for migraine. May repeat in 2 hours if headache persists or recurs.      [facility] Follow up.   Why: home oxygen Contact information: [place] [address] [phone]  [place], Marshall Cork., MD Follow up.   Specialty: Family Medicine Why: Call Dr. Wendie Agreste on Monday to arrange a follow up appointment Contact information: [address] [address] [phone]        Ladell Pier, MD. Call.   Specialty: Oncology Why: Call to see when you need to return to the Oncology center for follow up Contact information: [place] [address] [phone]          No Known Allergies  Consultations:  None   Procedures/Studies: Ct Chest W Contrast  Result Date: 12/24/2018 CLINICAL DATA:  New diagnosis of colon cancer. EXAM: CT CHEST, ABDOMEN, AND PELVIS WITH CONTRAST TECHNIQUE: Multidetector CT imaging of the chest, abdomen and pelvis was performed following the standard protocol during bolus administration of intravenous contrast. CONTRAST:  139mL OMNIPAQUE IOHEXOL 300 MG/ML  SOLN COMPARISON:  CT chest 9/13/7 FINDINGS: CT CHEST FINDINGS Cardiovascular: The heart size appears within normal limits. No pericardial effusion identified. Mediastinum/Nodes: No enlarged mediastinal, hilar, or axillary lymph nodes. Thyroid gland, trachea, and esophagus demonstrate no significant findings. Lungs/Pleura: No pleural effusion. No  airspace consolidation, atelectasis or pneumothorax. Calcified granuloma identified within the left upper lobe. Small, 4 mm left lower lobe lung nodule is unchanged compatible with a benign abnormality, image 139/6. Calcified granuloma is noted within the right upper lobe. No suspicious pulmonary nodule or mass. Stable left upper lobe lung nodule measuring 4 mm, image 94/6. Musculoskeletal: No chest wall mass or suspicious bone lesions identified. CT ABDOMEN PELVIS FINDINGS Hepatobiliary: No suspicious liver abnormality. The gallbladder appears normal. No biliary dilatation. Pancreas: Unremarkable. No pancreatic ductal dilatation or surrounding inflammatory changes. Spleen: Normal in size without focal abnormality. Adrenals/Urinary Tract: Adrenal glands are unremarkable. Kidneys are normal, without renal calculi, focal lesion, or hydronephrosis. Bladder is unremarkable. Stomach/Bowel: The stomach appears normal. The small bowel loops have a normal course and caliber. The appendix is visualized and appears normal. Abnormal circumferential wall thickening involving the proximal sigmoid colon is identified measuring 5.8 cm in length compatible with newly diagnosed colon cancer. No obstruction. Vascular/Lymphatic: Mild aortic atherosclerosis. No aneurysm. No retroperitoneal adenopathy. Multiple pericolonic lymph nodes within the left lower quadrant are identified concerning for local nodal metastasis. Lymph node directly adjacent to the sigmoid colon measures 1.2 cm. A second node between the descending colon and psoas muscle has a short axis of 1.1 cm, image 105/2. Index lymph node measures 1.1 cm, image 105/2. Reproductive: Prostate is unremarkable. Other: No abdominal wall hernia or abnormality. No abdominopelvic ascites. Musculoskeletal: No acute or significant osseous findings. IMPRESSION: 1. 5.8 cm segment proximal sigmoid colon with abnormal wall thickening compatible with newly diagnosed colon cancer. Multiple  prominent and enlarged adjacent lymph nodes compatible with local nodal metastasis. No findings to suggest distant metastatic disease. 2. Mild aortic atherosclerosis. Aortic Atherosclerosis (ICD10-I70.0). 3. Scattered tiny bilateral pulmonary nodules are unchanged from 2007 reflecting a benign process. Electronically Signed   By: Kerby Moors M.D.   On: 12/24/2018 09:25   Ct Abdomen Pelvis W Contrast  Result Date: 12/24/2018 CLINICAL DATA:  New diagnosis of colon cancer. EXAM: CT CHEST, ABDOMEN, AND PELVIS WITH CONTRAST TECHNIQUE: Multidetector CT imaging of the chest, abdomen and pelvis was performed following the standard protocol during bolus administration of intravenous contrast. CONTRAST:  159mL OMNIPAQUE IOHEXOL 300 MG/ML  SOLN COMPARISON:  CT chest 9/13/7 FINDINGS: CT CHEST FINDINGS Cardiovascular: The heart size appears within normal limits. No pericardial effusion identified. Mediastinum/Nodes: No enlarged mediastinal, hilar, or  axillary lymph nodes. Thyroid gland, trachea, and esophagus demonstrate no significant findings. Lungs/Pleura: No pleural effusion. No airspace consolidation, atelectasis or pneumothorax. Calcified granuloma identified within the left upper lobe. Small, 4 mm left lower lobe lung nodule is unchanged compatible with a benign abnormality, image 139/6. Calcified granuloma is noted within the right upper lobe. No suspicious pulmonary nodule or mass. Stable left upper lobe lung nodule measuring 4 mm, image 94/6. Musculoskeletal: No chest wall mass or suspicious bone lesions identified. CT ABDOMEN PELVIS FINDINGS Hepatobiliary: No suspicious liver abnormality. The gallbladder appears normal. No biliary dilatation. Pancreas: Unremarkable. No pancreatic ductal dilatation or surrounding inflammatory changes. Spleen: Normal in size without focal abnormality. Adrenals/Urinary Tract: Adrenal glands are unremarkable. Kidneys are normal, without renal calculi, focal lesion, or  hydronephrosis. Bladder is unremarkable. Stomach/Bowel: The stomach appears normal. The small bowel loops have a normal course and caliber. The appendix is visualized and appears normal. Abnormal circumferential wall thickening involving the proximal sigmoid colon is identified measuring 5.8 cm in length compatible with newly diagnosed colon cancer. No obstruction. Vascular/Lymphatic: Mild aortic atherosclerosis. No aneurysm. No retroperitoneal adenopathy. Multiple pericolonic lymph nodes within the left lower quadrant are identified concerning for local nodal metastasis. Lymph node directly adjacent to the sigmoid colon measures 1.2 cm. A second node between the descending colon and psoas muscle has a short axis of 1.1 cm, image 105/2. Index lymph node measures 1.1 cm, image 105/2. Reproductive: Prostate is unremarkable. Other: No abdominal wall hernia or abnormality. No abdominopelvic ascites. Musculoskeletal: No acute or significant osseous findings. IMPRESSION: 1. 5.8 cm segment proximal sigmoid colon with abnormal wall thickening compatible with newly diagnosed colon cancer. Multiple prominent and enlarged adjacent lymph nodes compatible with local nodal metastasis. No findings to suggest distant metastatic disease. 2. Mild aortic atherosclerosis. Aortic Atherosclerosis (ICD10-I70.0). 3. Scattered tiny bilateral pulmonary nodules are unchanged from 2007 reflecting a benign process. Electronically Signed   By: Kerby Moors M.D.   On: 12/24/2018 09:25   Dg Chest Port 1 View  Result Date: 01/05/2019 CLINICAL DATA:  Worsening shortness of breath and difficulty breathing. Poor oxygen saturation. EXAM: PORTABLE CHEST 1 VIEW COMPARISON:  CT 12/24/2018 FINDINGS: Heart and mediastinal shadows are normal. New bilateral patchy pulmonary infiltrates which could be due to bacterial or viral pneumonia. No evidence of heart failure or effusion. No bone abnormality. IMPRESSION: New widespread pulmonary infiltrates which  could be due to bacterial or viral pneumonia, including coronavirus. Electronically Signed   By: Nelson Chimes M.D.   On: 01/05/2019 10:27      Subjective: Feeling well.  No fatigue or dyspnea with ambulation to bathroom.  No chest pain, sputum.  No confusion.   Discharge Exam: Vitals:   01/10/19 1045 01/10/19 1121  BP:  117/83  Pulse:    Resp:  18  Temp:  98.8 F (37.1 C)  SpO2: 91%    Vitals:   01/10/19 0356 01/10/19 0823 01/10/19 1045 01/10/19 1121  BP: 96/66 109/73  117/83  Pulse:      Resp:  18  18  Temp: 98.4 F (36.9 C) 98.1 F (36.7 C)  98.8 F (37.1 C)  TempSrc: Oral Oral  Oral  SpO2: 91%  91%   Weight:      Height:        General: Pt is alert, awake, not in acute distress Cardiovascular: RRR, nl S1-S2, no murmurs appreciated.   No LE edema.   Respiratory: Normal respiratory rate and rhythm.  CTAB without rales or wheezes.  Abdominal: Abdomen soft and non-tender.  No distension or HSM.   Neuro/Psych: Strength symmetric in upper and lower extremities.  Judgment and insight appear normal.   The results of significant diagnostics from this hospitalization (including imaging, microbiology, ancillary and laboratory) are listed below for reference.     Microbiology: Recent Results (from the past 240 hour(s))  Culture, blood (Routine X 2) w Reflex to ID Panel     Status: None   Collection Time: 01/05/19  8:18 AM   Specimen: Left Antecubital; Blood  Result Value Ref Range Status   Specimen Description   Final    LEFT ANTECUBITAL Performed at [facility] [address], [place], [place] [address]    Special Requests   Final    BOTTLES DRAWN AEROBIC AND ANAEROBIC Blood Culture results may not be optimal due to an excessive volume of blood received in culture bottles Performed at [facility] [address], [place], [place] [address]    Culture   Final    NO GROWTH 5 DAYS Performed at [facility], [place] [address] [place], [place] [address]    Report Status 01/10/2019 FINAL  Final  SARS Coronavirus 2 (CEPHEID- Performed in [facility] hospital lab), Hosp Order     Status: Abnormal   Collection Time: 01/05/19  8:18 AM   Specimen: Nasopharyngeal Swab  Result Value Ref Range Status   SARS Coronavirus 2 POSITIVE (A) NEGATIVE Final    Comment: RESULT CALLED TO, READ BACK BY AND VERIFIED WITH: L.VAUGHN RN AT 1048 ON 01/05/2019 BY S.VANHOORNE (NOTE) If result is NEGATIVE SARS-CoV-2 target nucleic acids are NOT DETECTED. The SARS-CoV-2 RNA is generally detectable in upper and lower  respiratory specimens during the acute phase of infection. The lowest  concentration of SARS-CoV-2 viral copies this assay can detect is 250  copies / mL. A negative result does not preclude SARS-CoV-2 infection  and should not be used as the sole basis for treatment or other  patient management decisions.  A negative result may occur with  improper specimen collection / handling, submission of specimen other  than nasopharyngeal swab, presence of viral mutation(s) within the  areas targeted by this assay, and inadequate number of viral copies  (<250 copies / mL). A negative result must be combined with clinical  observations, patient history, and epidemiological information. If result is POSITIVE SARS-CoV-2 target nucleic acids are  DETECTED. The SARS-CoV-2 RNA is generally detectable in upper and lower  respiratory specimens during the acute phase of infection.  Positive  results are indicative of active infection with SARS-CoV-2.  Clinical  correlation with patient history and other diagnostic information is  necessary to determine patient infection status.  Positive results do  not rule out bacterial infection or co-infection with other viruses. If result is PRESUMPTIVE POSTIVE SARS-CoV-2 nucleic acids MAY BE PRESENT.   A presumptive positive result was obtained on the submitted specimen  and confirmed on repeat  testing.  While 2019 novel coronavirus  (SARS-CoV-2) nucleic acids may be present in the submitted sample  additional confirmatory testing may be necessary for epidemiological  and / or clinical management purposes  to differentiate between  SARS-CoV-2 and other Sarbecovirus currently known to infect humans.  If clinically indicated additional testing with an alternate test  methodology  [phone]) is advised. The SARS-CoV-2 RNA is generally  detectable in upper and lower respiratory specimens during the acute  phase of infection. The expected result is Negative. Fact  Sheet for Patients:  StrictlyIdeas.no Fact Sheet for Healthcare Providers: BankingDealers.co.za This test is not yet approved or cleared by the [place] FDA and has been authorized for detection and/or diagnosis of SARS-CoV-2 by FDA under an Emergency Use Authorization (EUA).  This EUA will remain in effect (meaning this test can be used) for the duration of the COVID-19 declaration under Section 564(b)(1) of the Act, 21 U.S.C. section 360bbb-3(b)(1), unless the authorization is terminated or revoked sooner. Performed at [facility], [place] [address] [place], [place] [address]   Culture, blood (Routine X 2) w Reflex to ID Panel     Status: None   Collection Time: 01/05/19  8:23 AM   Specimen: Right Antecubital; Blood  Result Value Ref Range Status   Specimen Description   Final    RIGHT ANTECUBITAL Performed at [facility] [address] [place], [place] [address]    Special Requests   Final    BOTTLES DRAWN AEROBIC AND ANAEROBIC Blood Culture results may not be optimal due to an inadequate volume of blood received in culture bottles Performed at [facility] [address], [place], [place] [address]    Culture   Final    NO GROWTH 5 DAYS Performed at [facility], [place] [address], [place],  [place] [address]    Report Status 01/10/2019 FINAL  Final  MRSA PCR Screening     Status: None   Collection Time: 01/05/19  4:48 PM   Specimen: Nasopharyngeal  Result Value Ref Range Status   MRSA by PCR NEGATIVE NEGATIVE Final    Comment:        The GeneXpert MRSA Assay (FDA approved for NASAL specimens only), is one component of a comprehensive MRSA colonization surveillance program. It is not intended to diagnose MRSA infection nor to guide or monitor treatment for MRSA infections. Performed at [facility], [place] [address] [place], [place] [address]      Labs: BNP (last 3 results) No results for input(s): BNP in the last 8760 hours. Basic Metabolic Panel: Recent Labs  Lab 01/05/19 0818 01/07/19 0522 01/08/19 0400 01/09/19 0300 01/10/19 0309  NA 132* 139 140 138 133*  K 3.4* 3.9 3.5 3.5 3.5  CL 101 107 109 108 105  CO2 21* 22 21* 23 23  GLUCOSE 234* 302* 91 82 99  BUN 14 22* 20 16 17   CREATININE 0.83 0.66 0.66 0.64 0.65  CALCIUM 7.9* 8.0* 7.7* 7.8* 7.6*   Liver Function Tests: Recent Labs  Lab 01/05/19 0818 01/07/19 0522 01/08/19 0400 01/09/19 0300 01/10/19 0309  AST 55* 58* 43* 74* 38  ALT 54* 85* 73* 162* 124*  ALKPHOS 123 98 85 88 80  BILITOT 0.8 0.6 0.4 0.5 0.7  PROT 7.3 6.5 5.9* 6.0* 6.1*  ALBUMIN 2.8* 2.6* 2.3* 2.5* 2.7*   No results for input(s): LIPASE, AMYLASE in the last 168 hours. No results for input(s): AMMONIA in the last 168 hours. CBC: Recent Labs  Lab 01/05/19 0818 01/07/19 0522 01/08/19 0400 01/09/19 0300 01/10/19 0309  WBC 5.4 5.0 3.8* 4.2 5.0  NEUTROABS 4.3 4.1 2.8 2.9 3.4  HGB 15.5 14.6 14.7 15.0 14.7  HCT 45.3 41.8 43.6 45.0 43.4  MCV 85.0 84.6 86.3 85.2 84.3  PLT 163 220 238 269 282   Cardiac Enzymes: No results for input(s): CKTOTAL, CKMB, CKMBINDEX, TROPONINI in the last 168 hours. BNP: Invalid input(s): POCBNP CBG: Recent Labs  Lab 01/09/19 1210 01/09/19  1629 01/09/19 2109 01/10/19 0736  01/10/19 1141  GLUCAP 199* 255* 167* 132* 156*   D-Dimer Recent Labs    01/09/19 0300 01/10/19 0309  DDIMER 1.27* 1.46*   Hgb A1c No results for input(s): HGBA1C in the last 72 hours. Lipid Profile No results for input(s): CHOL, HDL, LDLCALC, TRIG, CHOLHDL, LDLDIRECT in the last 72 hours. Thyroid function studies No results for input(s): TSH, T4TOTAL, T3FREE, THYROIDAB in the last 72 hours.  Invalid input(s): FREET3 Anemia work up Recent Labs    01/09/19 0300 01/10/19 0309  FERRITIN 786* 562*   Urinalysis No results found for: COLORURINE, APPEARANCEUR, [place], [place], GLUCOSEU, [place], [place], [place], [place], UROBILINOGEN, NITRITE, LEUKOCYTESUR Sepsis Labs Invalid input(s): PROCALCITONIN,  WBC,  LACTICIDVEN Microbiology Recent Results (from the past 240 hour(s))  Culture, blood (Routine X 2) w Reflex to ID Panel     Status: None   Collection Time: 01/05/19  8:18 AM   Specimen: Left Antecubital; Blood  Result Value Ref Range Status   Specimen Description   Final    LEFT ANTECUBITAL Performed at [facility] [address] [place], [place] [address]    Special Requests   Final    BOTTLES DRAWN AEROBIC AND ANAEROBIC Blood Culture results may not be optimal due to an excessive volume of blood received in culture bottles Performed at [facility] [address], [place], [place] [address]    Culture   Final    NO GROWTH 5 DAYS Performed at [facility], [place] [address] [place],  [address]    Report Status 01/10/2019 FINAL  Final  SARS Coronavirus 2 (CEPHEID- Performed in [facility] hospital lab), Hosp Order     Status: Abnormal   Collection Time: 01/05/19  8:18 AM   Specimen: Nasopharyngeal Swab  Result Value Ref Range Status   SARS Coronavirus 2 POSITIVE (A) NEGATIVE Final    Comment: RESULT CALLED TO, READ BACK BY AND VERIFIED WITH: L.VAUGHN RN AT 1048 ON 01/05/2019 BY S.VANHOORNE (NOTE) If result  is NEGATIVE SARS-CoV-2 target nucleic acids are NOT DETECTED. The SARS-CoV-2 RNA is generally detectable in upper and lower  respiratory specimens during the acute phase of infection. The lowest  concentration of SARS-CoV-2 viral copies this assay can detect is 250  copies / mL. A negative result does not preclude SARS-CoV-2 infection  and should not be used as the sole basis for treatment or other  patient management decisions.  A negative result may occur with  improper specimen collection / handling, submission of specimen other  than nasopharyngeal swab, presence of viral mutation(s) within the  areas targeted by this assay, and inadequate number of viral copies  (<250 copies / mL). A negative result must be combined with clinical  observations, patient history, and epidemiological information. If result is POSITIVE SARS-CoV-2 target nucleic acids are  DETECTED. The SARS-CoV-2 RNA is generally detectable in upper and lower  respiratory specimens during the acute phase of infection.  Positive  results are indicative of active infection with SARS-CoV-2.  Clinical  correlation with patient history and other diagnostic information is  necessary to determine patient infection status.  Positive results do  not rule out bacterial infection or co-infection with other viruses. If result is PRESUMPTIVE POSTIVE SARS-CoV-2 nucleic acids MAY BE PRESENT.   A presumptive positive result was obtained on the submitted specimen  and confirmed on repeat testing.  While 2019 novel coronavirus  (SARS-CoV-2) nucleic acids may be present in the  submitted sample  additional confirmatory testing may be necessary for epidemiological  and / or clinical management purposes  to differentiate between  SARS-CoV-2 and other Sarbecovirus currently known to infect humans.  If clinically indicated additional testing with an alternate test  methodology  [phone]) is advised. The SARS-CoV-2 RNA is generally   detectable in upper and lower respiratory specimens during the acute  phase of infection. The expected result is Negative. Fact Sheet for Patients:  StrictlyIdeas.no Fact Sheet for Healthcare Providers: BankingDealers.co.za This test is not yet approved or cleared by the [place] FDA and has been authorized for detection and/or diagnosis of SARS-CoV-2 by FDA under an Emergency Use Authorization (EUA).  This EUA will remain in effect (meaning this test can be used) for the duration of the COVID-19 declaration under Section 564(b)(1) of the Act, 21 U.S.C. section 360bbb-3(b)(1), unless the authorization is terminated or revoked sooner. Performed at [facility], [place] [address], [place], [place] [address]   Culture, blood (Routine X 2) w Reflex to ID Panel     Status: None   Collection Time: 01/05/19  8:23 AM   Specimen: Right Antecubital; Blood  Result Value Ref Range Status   Specimen Description   Final    RIGHT ANTECUBITAL Performed at [place] [address] [place], [place] [address]    Special Requests   Final    BOTTLES DRAWN AEROBIC AND ANAEROBIC Blood Culture results may not be optimal due to an inadequate volume of blood received in culture bottles Performed at [place] [address], [place], [place] [address]    Culture   Final    NO GROWTH 5 DAYS Performed at [facility], [place] [address], [place], [place] [address]    Report Status 01/10/2019 FINAL  Final  MRSA PCR Screening     Status: None   Collection Time: 01/05/19  4:48 PM   Specimen: Nasopharyngeal  Result Value Ref Range Status   MRSA by PCR NEGATIVE NEGATIVE Final    Comment:        The GeneXpert MRSA Assay (FDA approved for NASAL specimens only), is one component of a comprehensive MRSA colonization surveillance program. It is not intended to diagnose MRSA infection nor to  guide or monitor treatment for MRSA infections. Performed at [facility], [place] [address], [place], [place] [address]      Time coordinating discharge: 40 minutes      SIGNED:   Edwin Dada, MD  [facility] 01/10/2019, 5:41 PM

## 2019-01-28 ENCOUNTER — Telehealth: Payer: Self-pay

## 2019-01-28 NOTE — Telephone Encounter (Signed)
Spoke with pt to explain that he will see Dr. Johney Maine to discuss surgical options and that Dr. Benay Spice will see him after surgery if that is Dr. Clyda Greener recommendation. He understand that Dr. Benay Spice will see him 2-3 weeks after surgery. He had to reschedule his initial consult with Dr. Johney Maine from 7/14 to 7/20 due to scheduling conflict. I encouraged him to reach out to [facility] with questions about that appointment. He has my contact information for questions or concerns.

## 2019-02-02 ENCOUNTER — Ambulatory Visit: Payer: Self-pay | Admitting: Surgery

## 2019-02-02 NOTE — H&P (Signed)
Oscar Mccarty Documented: 02/02/2019 3:00 PM Location: [facility] Patient #: [id number] DOB: [birth year] Married / Language: English / Race: White Male  History of Present Illness Oscar Hector MD; 02/02/2019 5:13 PM) The patient is a 53 year old male who presents with colorectal cancer. Note for "Colorectal cancer": ` ` ` Patient sent for surgical consultation at the request of Dr. [name] gastroenterology  Chief Complaint: Rectosigmoid cancer. 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 Reformation made for surgical consultation. Had worse for respiratory discomfort and was found to be positive for Covid pneumonitis. He was admitted hospital for 5 days and discharged on 20 June. Discussion made with his pulmonologist recommended waiting 3 weeks before considering surgery. That would've been 11 July, last weekend.  Patient comes today by himself. He is had some unintentional weight loss. He was diagnosed with diabetes with elevated inguinal globin A1c. He is avoid sugars and breads. He is on oral hypoglycemics. He wanted to talk more about his diabetes anything else. He normally moves his bowels every day. However he had been increasingly constipated with occasional bleeding and cramping. Got a colonoscopy. Found to have mass in sigmoid colon and biopsy consistent with adenocarcinoma. There is discussion about seeing medical oncology. Recommendations he surgery first. No personal nor family history of inflammatory bowel disease, irritable bowel syndrome, allergy such as Celiac Sprue, dietary/dairy problems, colitis, ulcers nor gastritis. No recent sick contacts/gastroenteritis. No travel outside the country. No changes in diet. No dysphagia to solids or liquids. No significant heartburn or reflux. No hematemesis, coffee ground emesis. No evidence of prior gastric/peptic ulceration.  Normally can walk an hour without difficulty. Into saucerization he's feeling little more worn-out. He can still walk about 30 minutes at a go.  (Review of systems as stated in this history (HPI) or in the review of systems. Otherwise all other 12 point ROS are negative) ` ` `   Problem List/Past Medical Oscar Hector, MD; 02/02/2019 3:15 PM) COVID-19 WITH PULMONARY COMORBIDITY (U07.1, J98.4) Admitted 01/05/2019 with Covid pneumonitis. Discharge 6/20. Three-week convalescence recommended before considering surgery by Dr [name] of [facility] = 01/31/2019  Past Surgical History ([place], [place]; 02/02/2019 3:00 PM) No pertinent past surgical history  Diagnostic Studies History [name], CMA; 02/02/2019 3:00 PM) Colonoscopy within last year  Allergies [facility], CMA; 02/02/2019 3:01 PM) No Known Allergies [02/02/2019]: No Known Drug Allergies [02/02/2019]: Allergies Reconciled  Medication History [name], CMA; 02/02/2019 3:02 PM) Ondansetron (8MG  Tablet Disint, Oral) Active. glipiZIDE (5MG  Tablet, Oral) Active. metFORMIN HCl (500MG  Tablet, Oral) Active. Medications Reconciled  Other Problems Oscar Hector, MD; 02/02/2019 3:15 PM) Diabetes Mellitus Hemorrhoids Home Oxygen Use     Review of Systems ([place]; 02/02/2019 3:00 PM) Skin Not Present- Change in Wart/Mole, Dryness, Hives, Jaundice, New Lesions, Non-Healing Wounds, Rash and Ulcer. HEENT Not Present- Earache, Hearing Loss, Hoarseness, Nose Bleed, Oral Ulcers, Ringing in the Ears, Seasonal Allergies, Sinus Pain, Sore Throat, Visual Disturbances, Wears glasses/contact lenses and Yellow Eyes. Respiratory Not Present- Bloody sputum, Chronic Cough, Difficulty Breathing, Snoring and Wheezing. Breast Not Present- Breast Mass, Breast Pain, Nipple Discharge and Skin Changes. Cardiovascular Not Present- Chest Pain, Difficulty Breathing Lying Down, Leg Cramps, Palpitations, Rapid  Heart Rate, Shortness of Breath and Swelling of Extremities. Gastrointestinal Present- Abdominal Pain and Bloating. Not Present- Bloody Stool, Change in Bowel Habits, Chronic diarrhea, Constipation, Difficulty Swallowing, Excessive gas, Gets  full quickly at meals, Hemorrhoids, Indigestion, Nausea, Rectal Pain and Vomiting. Male Genitourinary Present- Impotence. Not Present- Blood in Urine, Change in Urinary Stream, Frequency, Nocturia, Painful Urination, Urgency and Urine Leakage.  Vitals (Sabrina Canty CMA; 02/02/2019 3:02 PM) 02/02/2019 3:02 PM Weight: 165.6 lb Height: 67in Body Surface Area: 1.87 m Body Mass Index: 25.94 kg/m  Temp.: 98.33F(Oral)  Pulse: 95 (Regular)  BP: 148/68 (Sitting, Left Arm, Standard)        Physical Exam Oscar Hector MD; 02/02/2019 5:14 PM)  General Mental Status-Alert. General Appearance-Not in acute distress, Not Sickly. Orientation-Oriented X3. Hydration-Well hydrated. Voice-Normal. Note: Pleasant. Chatty. Not toxic. Not sickly.  Integumentary Global Assessment Upon inspection and palpation of skin surfaces of the - Axillae: non-tender, no inflammation or ulceration, no drainage. and Distribution of scalp and body hair is normal. General Characteristics Temperature - normal warmth is noted.  Head and Neck Head-normocephalic, atraumatic with no lesions or palpable masses. Face Global Assessment - atraumatic, no absence of expression. Neck Global Assessment - no abnormal movements, no bruit auscultated on the right, no bruit auscultated on the left, no decreased range of motion, non-tender. Trachea-midline. Thyroid Gland Characteristics - non-tender.  Eye Eyeball - Left-Extraocular movements intact, No Nystagmus - Left. Eyeball - Right-Extraocular movements intact, No Nystagmus - Right. Cornea - Left-No Hazy - Left. Cornea - Right-No Hazy - Right. Sclera/Conjunctiva - Left-No scleral icterus, No  Discharge - Left. Sclera/Conjunctiva - Right-No scleral icterus, No Discharge - Right. Pupil - Left-Direct reaction to light normal. Pupil - Right-Direct reaction to light normal.  ENMT Ears Pinna - Left - no drainage observed, no generalized tenderness observed. Pinna - Right - no drainage observed, no generalized tenderness observed. Nose and Sinuses External Inspection of the Nose - no destructive lesion observed. Inspection of the nares - Left - quiet respiration. Inspection of the nares - Right - quiet respiration. Mouth and Throat Lips - Upper Lip - no fissures observed, no pallor noted. Lower Lip - no fissures observed, no pallor noted. Nasopharynx - no discharge present. Oral Cavity/Oropharynx - Tongue - no dryness observed. Oral Mucosa - no cyanosis observed. Hypopharynx - no evidence of airway distress observed.  Chest and Lung Exam Inspection Movements - Normal and Symmetrical. Accessory muscles - No use of accessory muscles in breathing. Palpation Palpation of the chest reveals - Non-tender. Auscultation Breath sounds - Normal and Clear.  Cardiovascular Auscultation Rhythm - Regular. Murmurs & Other Heart Sounds - Auscultation of the heart reveals - No Murmurs and No Systolic Clicks.  Abdomen Inspection Inspection of the abdomen reveals - No Visible peristalsis and No Abnormal pulsations. Umbilicus - No Bleeding, No Urine drainage. Palpation/Percussion Palpation and Percussion of the abdomen reveal - Soft, Non Tender, No Rebound tenderness, No Rigidity (guarding) and No Cutaneous hyperesthesia. Note: Abdomen soft. Nontender. Not distended. No umbilical or incisional hernias. No guarding.  Male Genitourinary Sexual Maturity Tanner 5 - Adult hair pattern and Adult penile size and shape. Note: No inguinal hernias. Normal external genitalia. Epididymi, testes, and spermatic cords normal without any masses.  Rectal Note: Deferred given recent colonoscopy   Peripheral Vascular Upper Extremity Inspection - Left - No Cyanotic nailbeds - Left, Not Ischemic. Inspection - Right - No Cyanotic nailbeds - Right, Not Ischemic.  Neurologic Neurologic evaluation reveals -normal attention span and ability to concentrate, able to name objects and repeat phrases. Appropriate fund of knowledge , normal sensation and normal coordination. Mental Status Affect - not angry, not paranoid. Cranial Nerves-Normal Bilaterally. Gait-Normal.  Neuropsychiatric Mental status exam performed with findings of-able to articulate well with normal speech/language, rate, volume and coherence, thought content normal with ability to perform basic computations and apply abstract reasoning and no evidence of hallucinations, delusions, obsessions or homicidal/suicidal ideation.  Musculoskeletal Global Assessment Spine, Ribs and Pelvis - no instability, subluxation or laxity. Right Upper Extremity - no instability, subluxation or laxity.  Lymphatic Head & Neck  General Head & Neck Lymphatics: Bilateral - Description - No Localized lymphadenopathy. Axillary  General Axillary Region: Bilateral - Description - No Localized lymphadenopathy. Femoral & Inguinal  Generalized Femoral & Inguinal Lymphatics: Left - Description - No Localized lymphadenopathy. Right - Description - No Localized lymphadenopathy.    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  You should hear from our office's scheduling department within 5 working days about the location, date, and time of surgery. We try to make accommodations for patient's preferences in scheduling surgery, but sometimes the OR schedule or the surgeon's schedule prevents [place] from making  those accommodations.  If you have not heard from our office [phone]) in 5 working days, call the office and ask for your surgeon's nurse.  If you have other questions about your diagnosis, plan, or surgery, call the office and ask for your surgeon's nurse.  Written instructions provided The anatomy & physiology of the digestive tract was discussed. The pathophysiology of the colon was discussed. Natural history risks without surgery was discussed. I feel the risks of no intervention will lead to serious problems that outweigh the operative risks; therefore, I recommended a partial colectomy to remove the pathology. Minimally invasive (Robotic/Laparoscopic) & open techniques were discussed.  Risks such as bleeding, infection, abscess, leak, reoperation, possible ostomy, hernia, heart attack, death, and other risks were discussed. I noted a good likelihood this will help address the problem. Goals of post-operative recovery were discussed as well. Need for adequate nutrition, daily bowel regimen and healthy physical activity, to optimize recovery was noted as well. We will work to minimize complications. Educational materials were available as well. Questions were answered. The patient expresses understanding & wishes to proceed with surgery.  Pt Education - [facility] Colon Bowel Prep 2018 ERAS/Miralax/Antibiotics Started Neomycin Sulfate 500 MG Oral Tablet, 2 (two) Tablet SEE NOTE, #6, 02/02/2019, No Refill. Local Order: Pharmacist Notes: TAKE TWO TABLETS AT 2 PM, 3 PM, AND 10 PM THE DAY PRIOR TO SURGERY Started Flagyl 500 MG Oral Tablet, 2 (two) Tablet SEE NOTE, #6, 02/02/2019, No Refill. Local Order: Pharmacist Notes: Take at 2pm, 3pm, and 10pm the day prior to your colon operation Pt Education - Pamphlet Given - Laparoscopic Colorectal Surgery: discussed with patient and provided information. Pt Education - [facility] Colectomy post-op instructions: discussed with patient and provided  information.  COVID-19 WITH PULMONARY COMORBIDITY (U07.1) Story: Admitted 01/05/2019 with Covid pneumonitis. Discharge 6/20. Three-week convalescence recommended before considering surgery by Dr [name] of [facility] = 01/31/2019 Impression: Recover from COVID pneumonitis last month. More than 3 weeks out from discharge & cleared by pulmonary.  Oscar Hector, MD, FACS, MASCRS Gastrointestinal and Minimally Invasive Surgery    [address] [place], [place] [address] [phone] Main / Paging [phone] Fax

## 2019-02-02 NOTE — H&P (Signed)
Oscar Mccarty Documented: 02/02/2019 3:00 PM Location: [facility] Patient #: [id number] DOB: 1966/04/04 Married / Language: English / Race: White Male   History of Present Illness Oscar Hector MD; 02/02/2019 5:13 PM) The patient is a 53 year old male who presents with colorectal cancer. Note for "Colorectal cancer": ` ` ` Patient sent for surgical consultation at the request of Oscar. 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 Reformation made for surgical consultation. Had worse for respiratory discomfort and was found to be positive for Covid pneumonitis. He was admitted hospital for 5 days and discharged on 20 June. Discussion made with his pulmonologist recommended waiting 3 weeks before considering Mccarty. That would've been 11 July, last weekend.  Patient comes today by himself. He is had some unintentional weight loss. He was diagnosed with diabetes with elevated inguinal globin A1c. He is avoid sugars and breads. He is on oral hypoglycemics. He wanted to talk more about his diabetes anything else. He normally moves his bowels every day. However he had been increasingly constipated with occasional bleeding and cramping. Got a colonoscopy. Found to have mass in sigmoid colon and biopsy consistent with adenocarcinoma. There is discussion about seeing medical oncology. Recommendations he Mccarty first. No personal nor family history of inflammatory bowel disease, irritable bowel syndrome, allergy such as Celiac Sprue, dietary/dairy problems, colitis, ulcers nor gastritis. No recent sick contacts/gastroenteritis. No travel outside the country. No changes in diet. No dysphagia to solids or liquids. No significant heartburn or reflux. No hematemesis, coffee ground emesis. No evidence of prior gastric/peptic  ulceration. Normally can walk an hour without difficulty. Into saucerization he's feeling little more worn-out. He can still walk about 30 minutes at a go.  (Review of systems as stated in this history (HPI) or in the review of systems. Otherwise all other 12 point ROS are negative) ` ` `   Problem List/Past Medical Oscar Hector, MD; 02/02/2019 3:15 PM) COVID-19 WITH Mccarty COMORBIDITY (U07.1, J98.4)  Admitted 01/05/2019 with Covid pneumonitis. Discharge 6/20. Three-week convalescence recommended before considering Mccarty by [facility] of [facility] = 01/31/2019  Past Surgical History (Oscar Mccarty; 02/02/2019 3:00 PM) No pertinent past surgical history   Diagnostic Studies History Oscar Mccarty, Oscar Mccarty; 02/02/2019 3:00 PM) Colonoscopy  within last year  Allergies Oscar Mccarty, Oscar Mccarty; 02/02/2019 3:01 PM) No Known Allergies  [02/02/2019]: No Known Drug Allergies  [02/02/2019]: Allergies Reconciled   Medication History Oscar Mccarty, Oscar Mccarty; 02/02/2019 3:02 PM) Ondansetron (8MG  Tablet Disint, Oral) Active. glipiZIDE (5MG  Tablet, Oral) Active. metFORMIN HCl (500MG  Tablet, Oral) Active. Medications Reconciled  Other Problems Oscar Hector, MD; 02/02/2019 3:15 PM) Diabetes Mellitus  Hemorrhoids  Home Oxygen Use     Review of Systems (Oscar Mccarty; 02/02/2019 3:00 PM) Skin Not Present- Change in Wart/Mole, Dryness, Hives, Jaundice, New Lesions, Non-Healing Wounds, Rash and Ulcer. HEENT Not Present- Earache, Hearing Loss, Hoarseness, Nose Bleed, Oral Ulcers, Ringing in the Ears, Seasonal Allergies, Sinus Pain, Sore Throat, Visual Disturbances, Wears glasses/contact lenses and Yellow Eyes. Respiratory Not Present- Bloody sputum, Chronic Cough, Difficulty Breathing, Snoring and Wheezing. Breast Not Present- Breast Mass, Breast Pain, Nipple Discharge and Skin Changes. Cardiovascular Not Present- Chest Pain, Difficulty Breathing Lying Down, Leg Cramps,  Palpitations, Rapid Heart Rate, Shortness of Breath and Swelling of Extremities. Gastrointestinal Present- Abdominal Pain and Bloating. Not Present- Bloody Stool, Change in Bowel  Habits, Chronic diarrhea, Constipation, Difficulty Swallowing, Excessive gas, Gets full quickly at meals, Hemorrhoids, Indigestion, Nausea, Rectal Pain and Vomiting. Male Genitourinary Present- Impotence. Not Present- Blood in Urine, Change in Urinary Stream, Frequency, Nocturia, Painful Urination, Urgency and Urine Leakage.  Vitals (Oscar Mccarty Oscar Mccarty; 02/02/2019 3:02 PM) 02/02/2019 3:02 PM Weight: 165.6 lb Height: 67in Body Surface Area: 1.87 m Body Mass Index: 25.94 kg/m  Temp.: 98.106F (Oral)  Pulse: 95 (Regular)  BP: 148/68(Sitting, Left Arm, Standard)       Physical Exam Oscar Hector MD; 02/02/2019 5:14 PM) General Mental Status-Alert. General Appearance-Not in acute distress, Not Sickly. Orientation-Oriented X3. Hydration-Well hydrated. Voice-Normal. Note: Pleasant. Chatty. Not toxic. Not sickly.   Integumentary Global Assessment Upon inspection and palpation of skin surfaces of the - Axillae: non-tender, no inflammation or ulceration, no drainage. and Distribution of scalp and body hair is normal. General Characteristics Temperature - normal warmth is noted.  Head and Neck Head-normocephalic, atraumatic with no lesions or palpable masses. Face Global Assessment - atraumatic, no absence of expression. Neck Global Assessment - no abnormal movements, no bruit auscultated on the right, no bruit auscultated on the left, no decreased range of motion, non-tender. Trachea-midline. Thyroid Gland Characteristics - non-tender.  Eye Eyeball - Left-Extraocular movements intact, No Nystagmus - Left. Eyeball - Right-Extraocular movements intact, No Nystagmus - Right. Cornea - Left-No Hazy - Left. Cornea - Right-No Hazy - Right. Sclera/Conjunctiva - Left-No scleral  icterus, No Discharge - Left. Sclera/Conjunctiva - Right-No scleral icterus, No Discharge - Right. Pupil - Left-Direct reaction to light normal. Pupil - Right-Direct reaction to light normal.  ENMT Ears Pinna - Left - no drainage observed, no generalized tenderness observed. Pinna - Right - no drainage observed, no generalized tenderness observed. Nose and Sinuses External Inspection of the Nose - no destructive lesion observed. Inspection of the nares - Left - quiet respiration. Inspection of the nares - Right - quiet respiration. Mouth and Throat Lips - Upper Lip - no fissures observed, no pallor noted. Lower Lip - no fissures observed, no pallor noted. Nasopharynx - no discharge present. Oral Cavity/Oropharynx - Tongue - no dryness observed. Oral Mucosa - no cyanosis observed. Hypopharynx - no evidence of airway distress observed.  Chest and Lung Exam Inspection Movements - Normal and Symmetrical. Accessory muscles - No use of accessory muscles in breathing. Palpation Palpation of the chest reveals - Non-tender. Auscultation Breath sounds - Normal and Clear.  Cardiovascular Auscultation Rhythm - Regular. Murmurs & Other Heart Sounds - Auscultation of the heart reveals - No Murmurs and No Systolic Clicks.  Abdomen Inspection Inspection of the abdomen reveals - No Visible peristalsis and No Abnormal pulsations. Umbilicus - No Bleeding, No Urine drainage. Palpation/Percussion Palpation and Percussion of the abdomen reveal - Soft, Non Tender, No Rebound tenderness, No Rigidity (guarding) and No Cutaneous hyperesthesia. Note: Abdomen soft. Nontender. Not distended. No umbilical or incisional hernias. No guarding.   Male Genitourinary Sexual Maturity Tanner 5 - Adult hair pattern and Adult penile size and shape. Note: No inguinal hernias. Normal external genitalia. Epididymi, testes, and spermatic cords normal without any masses.   Rectal Note: Deferred given recent  colonoscopy   Peripheral Vascular Upper Extremity Inspection - Left - No Cyanotic nailbeds - Left, Not Ischemic. Inspection - Right - No Cyanotic nailbeds - Right, Not Ischemic.  Neurologic Neurologic evaluation reveals -normal attention span and ability to concentrate, able to name objects and repeat phrases. Appropriate fund of knowledge , normal sensation and normal coordination. Mental  Status Affect - not angry, not paranoid. Cranial Nerves-Normal Bilaterally. Gait-Normal.  Neuropsychiatric Mental status exam performed with findings of-able to articulate well with normal speech/language, rate, volume and coherence, thought content normal with ability to perform basic computations and apply abstract reasoning and no evidence of hallucinations, delusions, obsessions or homicidal/suicidal ideation.  Musculoskeletal Global Assessment Spine, Ribs and Pelvis - no instability, subluxation or laxity. Right Upper Extremity - no instability, subluxation or laxity.  Lymphatic Head & Neck  General Head & Neck Lymphatics: Bilateral - Description - No Localized lymphadenopathy. Axillary  General Axillary Region: Bilateral - Description - No Localized lymphadenopathy. Femoral & Inguinal  Generalized Femoral & Inguinal Lymphatics: Left - Description - No Localized lymphadenopathy. Right - Description - No Localized lymphadenopathy.    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  You should hear from our office's scheduling department within 5 working days about the location, date, and time of Mccarty. We try to make accommodations for patient's preferences in scheduling Mccarty, but sometimes the OR schedule or the surgeon's schedule prevents [place]  from making those accommodations.  If you have not heard from our office [phone]) in 5 working days, call the office and ask for your surgeon's nurse.  If you have other questions about your diagnosis, plan, or Mccarty, call the office and ask for your surgeon's nurse.  Written instructions provided The anatomy & physiology of the digestive tract was discussed. The pathophysiology of the colon was discussed. Natural history risks without Mccarty was discussed. I feel the risks of no intervention will lead to serious problems that outweigh the operative risks; therefore, I recommended a partial colectomy to remove the pathology. Minimally invasive (Robotic/Laparoscopic) & open techniques were discussed.  Risks such as bleeding, infection, abscess, leak, reoperation, possible ostomy, hernia, heart attack, death, and other risks were discussed. I noted a good likelihood this will help address the problem. Goals of post-operative recovery were discussed as well. Need for adequate nutrition, daily bowel regimen and healthy physical activity, to optimize recovery was noted as well. We will work to minimize complications. Educational materials were available as well. Questions were answered. The patient expresses understanding & wishes to proceed with Mccarty.  Pt Education - [facility] Colon Bowel Prep 2018 ERAS/Miralax/Antibiotics Started Neomycin Sulfate 500 MG Oral Tablet, 2 (two) Tablet SEE NOTE, #6, 02/02/2019, No Refill. Local Order: Pharmacist Notes: TAKE TWO TABLETS AT 2 PM, 3 PM, AND 10 PM THE DAY PRIOR TO Mccarty Started Flagyl 500 MG Oral Tablet, 2 (two) Tablet SEE NOTE, #6, 02/02/2019, No Refill. Local Order: Pharmacist Notes: Take at 2pm, 3pm, and 10pm the day prior to your colon operation Pt Education - Pamphlet Given - Laparoscopic Colorectal Mccarty: discussed with patient and provided information. Pt Education - [facility] Colectomy post-op instructions: discussed with patient and  provided information.  COVID-19 WITH Mccarty COMORBIDITY (U07.1) Story: Admitted 01/05/2019 with Covid pneumonitis. Discharge 6/20. Three-week convalescence recommended before considering Mccarty by [facility] of [facility] M[facility]arty = 01/31/2019 Impression: Recover from COVID pneumonitis last month. More than 3 weeks out from discharge & cleared by Mccarty.    Signed by Oscar Hector, MD (02/02/2019 5:15 PM)

## 2019-02-02 NOTE — H&P (Signed)
Patient was Covid positive on 01/05/2019.  Was in the hospital 5 days and is asymptomatic now, 3.5 weeks later.  According Dr. [name] with the [facility] pulmonary, he should be Covid free head 3 weeks and does not require preoperative testing..  I do not know if he needs to be retested is my understanding he does not, but we will defer to anesthesia if further repeat COIVD testing warranted.  Oscar Hector, MD, FACS, MASCRS Gastrointestinal and Minimally Invasive Surgery    [address], [place], [place] [address] [phone] Main / Paging [phone] Fax

## 2019-02-03 ENCOUNTER — Ambulatory Visit: Payer: Self-pay | Admitting: Surgery

## 2019-02-09 ENCOUNTER — Encounter: Payer: Self-pay | Admitting: Gastroenterology

## 2019-02-12 ENCOUNTER — Telehealth: Payer: Self-pay | Admitting: Gastroenterology

## 2019-02-12 NOTE — Telephone Encounter (Signed)
Left a message at [facility] for patient to have a sooner appt-awaiting response;  Attempted to reach patient -unable to leave a VM; will attempt to reach patient at a later date/time;\

## 2019-02-12 NOTE — Telephone Encounter (Signed)
He has colon cancer. We do expect blood in the stool. He is awaiting left hemicolectomy by Dr. Johney Maine in September.  He was delayed due to patient having COVID-19.  Plan: -Check CBC, CMP. -He should get in touch with Dr. Johney Maine to see if surgery can be moved up (if he is having significant problems). -Hold off on appointment with me for now until blood tests are back. -MiraLAX only if he is constipated.  Thx  RG

## 2019-02-12 NOTE — Telephone Encounter (Signed)
RN has called the patient's daughter and wife-they report the patient will not be able to answer his phone until after 4 pm today as he is at work and cannot get on the phone while there They also stated the patient has been dealing with blood in stool and on TP for one week -as the patient told them what was going on-patient also told them his stomach has been hurting all week as well-  Do you want me to work the patient in to be seen on 02/13/2019 at 11:30 or request he be seen next week after trying prep-H and Miralax to help with bowel movements or RX meds? Please advise

## 2019-02-13 ENCOUNTER — Other Ambulatory Visit: Payer: Self-pay

## 2019-02-13 DIAGNOSIS — K625 Hemorrhage of anus and rectum: Secondary | ICD-10-CM

## 2019-02-13 DIAGNOSIS — C189 Malignant neoplasm of colon, unspecified: Secondary | ICD-10-CM

## 2019-02-13 NOTE — Telephone Encounter (Signed)
Called and spoke with patient-patient reports he is having regular bowel movements without straining; reports the blood is not a lot when he has a bowel movement but it has been going on  "for over a week now and I got worried"; patient was reassured a little blood may be hemorrhoids and advised to use hemorrhoid cream until Dr. Johney Maine' office contacts him concerning surgery;   Called and spoke with Dr.Gross' RN -informed her of the patient's current situation-advised of request for patient to have lab work completed and that the patient was advised to use Preparation-H and/ior Miralax for comfort; she report she is going to get in touch with Dr. Johney Maine concerning moving up the patient's surgery from 03/26/2019 to sooner; she report she will get in touch with the patient for the update on status;   She reports Dr. Johney Maine will order labs if he feels it is necessary;  Please advise if there are any further orders from Dr. Lyndel Safe

## 2019-03-04 ENCOUNTER — Ambulatory Visit: Payer: BC Managed Care – PPO | Admitting: Gastroenterology

## 2019-03-04 ENCOUNTER — Other Ambulatory Visit (HOSPITAL_COMMUNITY): Payer: Self-pay | Admitting: *Deleted

## 2019-03-04 NOTE — Patient Instructions (Addendum)
YOU NEED TO HAVE A COVID 19 TEST ON 03-07-2019 AT 955 AM. THIS TEST MUST BE DONE BEFORE SURGERY, COME  [place], [place] , [id number]. ONCE YOUR COVID TEST IS COMPLETED, PLEASE BEGIN THE QUARANTINE INSTRUCTIONS AS OUTLINED IN YOUR HANDOUT.                [facility]     Your procedure is scheduled on: 03-11-2019   Report to [facility] Main  Entrance  Report to admitting at  1215 PM   1 VISITOR IS ALLOWED TO WAIT IN WAITING ROOM  ONLY DAY OF YOUR SURGERY.    Call this number if you have problems the morning of surgery [phone]    Remember: [place], NO CHEWING GUM CANDY OR MINTS.  DRINK 2 PRESURGERY  G2  DRINKS THE NIGHT BEFORE SURGERY AT  10:00 PM  AND 1 PRESURGERY  G2  DRINK THE DAY OF THE PROCEDURE  AT 11:15 AM.  NO SOLIDS AFTER MIDNIGHT Monday NIGHT  . NOTHING BY MOUTH EXCEPT CLEAR LIQUIDS UNTIL 11:15 AM.  FINISH PRESURGERY G2 DRINK PER SURGEON ORDER WHICH NEEDS TO BE COMPLETED AT  11:15 AM.   CLEAR LIQUIDS ALL DAY Tuesday 03-10-2019.       CLEAR LIQUID DIET   Foods Allowed                                                                     Foods Excluded  Coffee and tea, regular and decaf                             liquids that you cannot  Plain Jell-O any favor except red or purple                                           see through such as: Fruit ices (not with fruit pulp)                                     milk, soups, orange juice  Iced Popsicles                                    All solid food Carbonated beverages, regular and diet                                    Cranberry, grape and apple juices Sports drinks like Gatorade Lightly seasoned clear broth or consume(fat free) Sugar, honey syrup  Sample Menu Breakfast                                Lunch  Supper Cranberry juice                    Beef broth                            Chicken  broth Jell-O                                     Grape juice                           Apple juice Coffee or tea                        Jell-O                                      Popsicle                                                Coffee or tea                        Coffee or tea  _____________________________________________________________________    FOLLOW ALL BOWEL PREP INSTRUCTIONS PER DR GROSS.    Take these medicines the morning of surgery with A SIP OF WATER: none  DO NOT TAKE ANY DIABETIC MEDICATIONS DAY OF YOUR SURGERY       How to Manage Your Diabetes  Before and After Surgery  Why is it important to control my blood sugar before and after surgery? . Improving blood sugar levels before and after surgery helps healing and can limit problems. . A way of improving blood sugar control is eating a healthy diet by: o  Eating less sugar and carbohydrates o  Increasing activity/exercise o  Talking with your doctor about reaching your blood sugar goals . High blood sugars (greater than 180 mg/dL) can raise your risk of infections and slow your recovery, so you will need to focus on controlling your diabetes during the weeks before surgery. . Make sure that the doctor who takes care of your diabetes knows about your planned surgery including the date and location.  How do I manage my blood sugar before surgery? . Check your blood sugar at least 4 times a day, starting 2 days before surgery, to make sure that the level is not too high or low. o Check your blood sugar the morning of your surgery when you wake up and every 2 hours until you get to the Short Stay unit. . If your blood sugar is less than 70 mg/dL, you will need to treat for low blood sugar: o Do not take insulin. o Treat a low blood sugar (less than 70 mg/dL) with  cup of clear juice (cranberry or apple), 4 glucose tablets, OR glucose gel. o Recheck blood sugar in 15 minutes after treatment (to make sure it is  greater than 70 mg/dL). If your blood sugar is not greater than 70 mg/dL on recheck, call [phone] for further instructions. . Report your blood sugar to the short stay nurse when you  get to Short Stay.  . If you are admitted to the hospital after surgery: o Your blood sugar will be checked by the staff and you will probably be given insulin after surgery (instead of oral diabetes medicines) to make sure you have good blood sugar levels. o The goal for blood sugar control after surgery is 80-180 mg/dL.   WHAT DO I DO ABOUT MY DIABETES MEDICATION?  Marland Kitchen Do not take oral diabetes medicines (pills) the morning of surgery.  . THE DAY  BEFORE SURGERY TAKE METFORMIN AS USUAL.     THE MORNING OF SURGERY DO NOT TAKE METFORMIN.                         You may not have any metal on your body including hair pins and              piercings  Do not wear jewelry,  lotions, powders or , deodorant                          Men may shave face and neck.   Do not bring valuables to the hospital. [place].  Contacts, dentures or bridgework may not be worn into surgery.       _____________________________________________________________________             [facility] - Preparing for Surgery Before surgery, you can play an important role.   Because skin is not sterile, your skin needs to be as free of germs as possible.   You can reduce the number of germs on your skin by washing with CHG (chlorahexidine gluconate) soap before surgery.   CHG is an antiseptic cleaner which kills germs and bonds with the skin to continue killing germs even after washing. Please DO NOT use if you have an allergy to CHG or antibacterial soaps.   If your skin becomes reddened/irritated stop using the CHG and inform your nurse when you arrive at Short Stay. .  You may shave your face/neck. Please follow these instructions carefully:  1.  Shower with CHG Soap the night  before surgery and the  morning of Surgery.  2.  If you choose to wash your hair, wash your hair first as usual with your  normal  shampoo.  3.  After you shampoo, rinse your hair and body thoroughly to remove the  shampoo.                                        4.  Use CHG as you would any other liquid soap.  You can apply chg directly  to the skin and wash                       Gently with a scrungie or clean washcloth.  5.  Apply the CHG Soap to your body ONLY FROM THE NECK DOWN.   Do not use on face/ open                           Wound or open sores. Avoid contact with eyes, ears mouth and genitals (private parts).  Wash face,  Genitals (private parts) with your normal soap.             6.  Wash thoroughly, paying special attention to the area where your surgery  will be performed.  7.  Thoroughly rinse your body with warm water from the neck down.  8.  DO NOT shower/wash with your normal soap after using and rinsing off  the CHG Soap.                9.  Pat yourself dry with a clean towel.            10.  Wear clean pajamas.            11.  Place clean sheets on your bed the night of your first shower and do not  sleep with pets. Day of Surgery : Do not apply any lotions/deodorants the morning of surgery.  Please wear clean clothes to the hospital/surgery center.  FAILURE TO FOLLOW THESE INSTRUCTIONS MAY RESULT IN THE CANCELLATION OF YOUR SURGERY PATIENT SIGNATURE_________________________________  NURSE SIGNATURE__________________________________  ________________________________________________________________________  WHAT IS A BLOOD TRANSFUSION? Blood Transfusion Information  A transfusion is the replacement of blood or some of its parts. Blood is made up of multiple cells which provide different functions.  Red blood cells carry oxygen and are used for blood loss replacement.  White blood cells fight against infection.  Platelets control  bleeding.  Plasma helps clot blood.  Other blood products are available for specialized needs, such as hemophilia or other clotting disorders. BEFORE THE TRANSFUSION  Who gives blood for transfusions?   Healthy volunteers who are fully evaluated to make sure their blood is safe. This is blood bank blood. Transfusion therapy is the safest it has ever been in the practice of medicine. Before blood is taken from a donor, a complete history is taken to make sure that person has no history of diseases nor engages in risky social behavior (examples are intravenous drug use or sexual activity with multiple partners). The donor's travel history is screened to minimize risk of transmitting infections, such as malaria. The donated blood is tested for signs of infectious diseases, such as HIV and hepatitis. The blood is then tested to be sure it is compatible with you in order to minimize the chance of a transfusion reaction. If you or a relative donates blood, this is often done in anticipation of surgery and is not appropriate for emergency situations. It takes many days to process the donated blood. RISKS AND COMPLICATIONS Although transfusion therapy is very safe and saves many lives, the main dangers of transfusion include:   Getting an infectious disease.  Developing a transfusion reaction. This is an allergic reaction to something in the blood you were given. Every precaution is taken to prevent this. The decision to have a blood transfusion has been considered carefully by your caregiver before blood is given. Blood is not given unless the benefits outweigh the risks. AFTER THE TRANSFUSION  Right after receiving a blood transfusion, you will usually feel much better and more energetic. This is especially true if your red blood cells have gotten low (anemic). The transfusion raises the level of the red blood cells which carry oxygen, and this usually causes an energy increase.  The nurse  administering the transfusion will monitor you carefully for complications. HOME CARE INSTRUCTIONS  No special instructions are needed after a transfusion. You may find your energy is better. Speak with your caregiver about any  limitations on activity for underlying diseases you may have. SEEK MEDICAL CARE IF:   Your condition is not improving after your transfusion.  You develop redness or irritation at the intravenous (IV) site. SEEK IMMEDIATE MEDICAL CARE IF:  Any of the following symptoms occur over the next 12 hours:  Shaking chills.  You have a temperature by mouth above 102 F (38.9 C), not controlled by medicine.  Chest, back, or muscle pain.  People around you feel you are not acting correctly or are confused.  Shortness of breath or difficulty breathing.  Dizziness and fainting.  You get a rash or develop hives.  You have a decrease in urine output.  Your urine turns a dark color or changes to pink, red, or brown. Any of the following symptoms occur over the next 10 days:  You have a temperature by mouth above 102 F (38.9 C), not controlled by medicine.  Shortness of breath.  Weakness after normal activity.  The white part of the eye turns yellow (jaundice).  You have a decrease in the amount of urine or are urinating less often.  Your urine turns a dark color or changes to pink, red, or brown. Document Released: 07/06/2000 Document Revised: 10/01/2011 Document Reviewed: 02/23/2008 [facility] Patient Information 2014 [place], [place].  _______________________________________________________________________

## 2019-03-05 ENCOUNTER — Encounter (HOSPITAL_COMMUNITY)
Admission: RE | Admit: 2019-03-05 | Discharge: 2019-03-05 | Disposition: A | Discharge status: Home / Self Care | Payer: BC Managed Care – PPO | Source: Ambulatory Visit | Attending: Surgery | Admitting: Surgery

## 2019-03-05 ENCOUNTER — Other Ambulatory Visit: Payer: Self-pay

## 2019-03-05 ENCOUNTER — Encounter (HOSPITAL_COMMUNITY): Payer: Self-pay

## 2019-03-05 DIAGNOSIS — C19 Malignant neoplasm of rectosigmoid junction: Secondary | ICD-10-CM | POA: Diagnosis not present

## 2019-03-05 DIAGNOSIS — E119 Type 2 diabetes mellitus without complications: Secondary | ICD-10-CM | POA: Diagnosis not present

## 2019-03-05 DIAGNOSIS — I10 Essential (primary) hypertension: Secondary | ICD-10-CM | POA: Diagnosis not present

## 2019-03-05 DIAGNOSIS — Z01818 Encounter for other preprocedural examination: Secondary | ICD-10-CM | POA: Diagnosis present

## 2019-03-05 LAB — CBC
HCT: 40.1 % (ref 39.0–52.0)
Hemoglobin: 12.6 g/dL — ABNORMAL LOW (ref 13.0–17.0)
MCH: 27.9 pg (ref 26.0–34.0)
MCHC: 31.4 g/dL (ref 30.0–36.0)
MCV: 88.7 fL (ref 80.0–100.0)
Platelets: 215 10*3/uL (ref 150–400)
RBC: 4.52 MIL/uL (ref 4.22–5.81)
RDW: 12.8 % (ref 11.5–15.5)
WBC: 6.3 10*3/uL (ref 4.0–10.5)
nRBC: 0 % (ref 0.0–0.2)

## 2019-03-05 LAB — BASIC METABOLIC PANEL
Anion gap: 6 (ref 5–15)
BUN: 11 mg/dL (ref 6–20)
CO2: 25 mmol/L (ref 22–32)
Calcium: 8.7 mg/dL — ABNORMAL LOW (ref 8.9–10.3)
Chloride: 107 mmol/L (ref 98–111)
Creatinine, Ser: 0.61 mg/dL (ref 0.61–1.24)
GFR calc Af Amer: >60 mL/min (ref >60)
GFR calc non Af Amer: >60 mL/min (ref >60)
Glucose, Bld: 93 mg/dL (ref 70–99)
Potassium: 3.8 mmol/L (ref 3.5–5.1)
Sodium: 138 mmol/L (ref 135–145)

## 2019-03-05 LAB — HEMOGLOBIN A1C
Hgb A1c MFr Bld: 5.9 % — ABNORMAL HIGH (ref 4.8–5.6)
Mean Plasma Glucose: 122.63 mg/dL

## 2019-03-05 LAB — GLUCOSE, CAPILLARY: Glucose-Capillary: 100 mg/dL — ABNORMAL HIGH (ref 70–99)

## 2019-03-06 LAB — ABO/RH: ABO/RH(D): O POS

## 2019-03-07 ENCOUNTER — Inpatient Hospital Stay (HOSPITAL_COMMUNITY): Admission: RE | Admit: 2019-03-07 | Payer: BC Managed Care – PPO | Source: Ambulatory Visit

## 2019-03-09 ENCOUNTER — Other Ambulatory Visit (HOSPITAL_COMMUNITY)
Admission: RE | Admit: 2019-03-09 | Discharge: 2019-03-09 | Disposition: A | Discharge status: Home / Self Care | Payer: BC Managed Care – PPO | Source: Ambulatory Visit | Attending: Surgery | Admitting: Surgery

## 2019-03-09 DIAGNOSIS — Z01812 Encounter for preprocedural laboratory examination: Secondary | ICD-10-CM | POA: Diagnosis present

## 2019-03-09 DIAGNOSIS — Z20828 Contact with and (suspected) exposure to other viral communicable diseases: Secondary | ICD-10-CM | POA: Insufficient documentation

## 2019-03-09 LAB — SARS CORONAVIRUS 2 (TAT 6-24 HRS): SARS Coronavirus 2: NEGATIVE

## 2019-03-10 MED ORDER — SODIUM CHLORIDE 0.9 % IV SOLN
INTRAVENOUS | Status: DC
  Filled 2019-03-10: qty 6

## 2019-03-10 MED ORDER — BUPIVACAINE LIPOSOME 1.3 % IJ SUSP
20.0000 mL | Freq: Once | INTRAMUSCULAR | Status: DC
  Filled 2019-03-10: qty 20

## 2019-03-11 ENCOUNTER — Inpatient Hospital Stay (HOSPITAL_COMMUNITY): Payer: BC Managed Care – PPO | Admitting: Physician Assistant

## 2019-03-11 ENCOUNTER — Encounter (HOSPITAL_COMMUNITY): Payer: Self-pay | Admitting: Surgery

## 2019-03-11 ENCOUNTER — Inpatient Hospital Stay (HOSPITAL_COMMUNITY)
Admission: RE | Admit: 2019-03-11 | Discharge: 2019-03-13 | DRG: 330 | Disposition: A | Discharge status: Home / Self Care | Payer: BC Managed Care – PPO | Source: Ambulatory Visit | Attending: Surgery | Admitting: Surgery

## 2019-03-11 ENCOUNTER — Inpatient Hospital Stay (HOSPITAL_COMMUNITY): Payer: BC Managed Care – PPO | Admitting: Certified Registered Nurse Anesthetist

## 2019-03-11 ENCOUNTER — Other Ambulatory Visit: Payer: Self-pay

## 2019-03-11 ENCOUNTER — Encounter (HOSPITAL_COMMUNITY)
Admission: RE | Disposition: A | Discharge status: Home / Self Care | Payer: Self-pay | Source: Ambulatory Visit | Attending: Surgery

## 2019-03-11 DIAGNOSIS — J309 Allergic rhinitis, unspecified: Secondary | ICD-10-CM | POA: Diagnosis present

## 2019-03-11 DIAGNOSIS — Z8619 Personal history of other infectious and parasitic diseases: Secondary | ICD-10-CM | POA: Diagnosis not present

## 2019-03-11 DIAGNOSIS — F329 Major depressive disorder, single episode, unspecified: Secondary | ICD-10-CM | POA: Diagnosis present

## 2019-03-11 DIAGNOSIS — C19 Malignant neoplasm of rectosigmoid junction: Principal | ICD-10-CM | POA: Diagnosis present

## 2019-03-11 DIAGNOSIS — E119 Type 2 diabetes mellitus without complications: Secondary | ICD-10-CM | POA: Diagnosis present

## 2019-03-11 DIAGNOSIS — Z7984 Long term (current) use of oral hypoglycemic drugs: Secondary | ICD-10-CM

## 2019-03-11 DIAGNOSIS — K5900 Constipation, unspecified: Secondary | ICD-10-CM | POA: Diagnosis present

## 2019-03-11 DIAGNOSIS — Z9981 Dependence on supplemental oxygen: Secondary | ICD-10-CM

## 2019-03-11 DIAGNOSIS — R634 Abnormal weight loss: Secondary | ICD-10-CM | POA: Diagnosis present

## 2019-03-11 DIAGNOSIS — Z20828 Contact with and (suspected) exposure to other viral communicable diseases: Secondary | ICD-10-CM | POA: Diagnosis present

## 2019-03-11 DIAGNOSIS — C187 Malignant neoplasm of sigmoid colon: Secondary | ICD-10-CM | POA: Diagnosis present

## 2019-03-11 DIAGNOSIS — R51 Headache: Secondary | ICD-10-CM | POA: Diagnosis present

## 2019-03-11 DIAGNOSIS — K625 Hemorrhage of anus and rectum: Secondary | ICD-10-CM | POA: Diagnosis present

## 2019-03-11 DIAGNOSIS — Z6824 Body mass index (BMI) 24.0-24.9, adult: Secondary | ICD-10-CM

## 2019-03-11 DIAGNOSIS — K219 Gastro-esophageal reflux disease without esophagitis: Secondary | ICD-10-CM | POA: Diagnosis present

## 2019-03-11 DIAGNOSIS — Z79899 Other long term (current) drug therapy: Secondary | ICD-10-CM

## 2019-03-11 DIAGNOSIS — Z8616 Personal history of COVID-19: Secondary | ICD-10-CM

## 2019-03-11 HISTORY — PX: XI ROBOTIC ASSISTED COLOSTOMY TAKEDOWN: SHX6828

## 2019-03-11 HISTORY — PX: PROCTOSCOPY: SHX2266

## 2019-03-11 LAB — TYPE AND SCREEN
ABO/RH(D): O POS
Antibody Screen: NEGATIVE

## 2019-03-11 LAB — GLUCOSE, CAPILLARY
Glucose-Capillary: 89 mg/dL (ref 70–99)
Glucose-Capillary: 192 mg/dL — ABNORMAL HIGH (ref 70–99)
Glucose-Capillary: 157 mg/dL — ABNORMAL HIGH (ref 70–99)
Glucose-Capillary: 150 mg/dL — ABNORMAL HIGH (ref 70–99)

## 2019-03-11 SURGERY — CLOSURE, COLOSTOMY, ROBOT-ASSISTED
Anesthesia: General | Site: Abdomen

## 2019-03-11 MED ORDER — FENTANYL CITRATE (PF) 100 MCG/2ML IJ SOLN
INTRAMUSCULAR | Status: AC
  Filled 2019-03-11: qty 2

## 2019-03-11 MED ORDER — MIDAZOLAM HCL 5 MG/5ML IJ SOLN
INTRAMUSCULAR | Status: DC | PRN
  Administered 2019-03-11: 2 mg via INTRAVENOUS

## 2019-03-11 MED ORDER — ALVIMOPAN 12 MG PO CAPS
12.0000 mg | ORAL_CAPSULE | Freq: Two times a day (BID) | ORAL | Status: DC
  Administered 2019-03-12: 12 mg via ORAL
  Filled 2019-03-11 (×2): qty 1

## 2019-03-11 MED ORDER — LACTATED RINGERS IV BOLUS: 1000.0000 mL | Freq: Three times a day (TID) | INTRAVENOUS | Status: DC | PRN

## 2019-03-11 MED ORDER — ONDANSETRON HCL 4 MG/2ML IJ SOLN
4.0000 mg | Freq: Four times a day (QID) | INTRAMUSCULAR | Status: DC | PRN
  Administered 2019-03-12: 4 mg via INTRAVENOUS
  Filled 2019-03-11 (×3): qty 2

## 2019-03-11 MED ORDER — TRAMADOL HCL 50 MG PO TABS: 50.0000 mg | ORAL_TABLET | Freq: Four times a day (QID) | ORAL | 0 refills | Status: DC | PRN

## 2019-03-11 MED ORDER — SODIUM CHLORIDE 0.9 % IV SOLN: Freq: Three times a day (TID) | INTRAVENOUS | Status: DC | PRN

## 2019-03-11 MED ORDER — SUMATRIPTAN SUCCINATE 50 MG PO TABS
50.0000 mg | ORAL_TABLET | ORAL | Status: DC | PRN
  Filled 2019-03-11: qty 1

## 2019-03-11 MED ORDER — ACETAMINOPHEN 500 MG PO TABS
1000.0000 mg | ORAL_TABLET | ORAL | Status: AC
  Administered 2019-03-11: 13:00:00 1000 mg via ORAL
  Filled 2019-03-11: qty 2

## 2019-03-11 MED ORDER — ALVIMOPAN 12 MG PO CAPS
12.0000 mg | ORAL_CAPSULE | ORAL | Status: AC
  Administered 2019-03-11: 12 mg via ORAL
  Filled 2019-03-11: qty 1

## 2019-03-11 MED ORDER — POLYETHYLENE GLYCOL 3350 17 GM/SCOOP PO POWD: 1.0000 | Freq: Once | ORAL | Status: DC

## 2019-03-11 MED ORDER — MIDAZOLAM HCL 2 MG/2ML IJ SOLN
INTRAMUSCULAR | Status: AC
  Filled 2019-03-11: qty 2

## 2019-03-11 MED ORDER — FENTANYL CITRATE (PF) 100 MCG/2ML IJ SOLN
25.0000 ug | INTRAMUSCULAR | Status: DC | PRN
  Administered 2019-03-11 (×3): 50 ug via INTRAVENOUS

## 2019-03-11 MED ORDER — LIDOCAINE 2% (20 MG/ML) 5 ML SYRINGE
INTRAMUSCULAR | Status: AC
  Filled 2019-03-11: qty 5

## 2019-03-11 MED ORDER — PHENYLEPHRINE HCL (PRESSORS) 10 MG/ML IV SOLN
INTRAVENOUS | Status: AC
  Filled 2019-03-11: qty 1

## 2019-03-11 MED ORDER — SACCHAROMYCES BOULARDII 250 MG PO CAPS
250.0000 mg | ORAL_CAPSULE | Freq: Two times a day (BID) | ORAL | Status: DC
  Administered 2019-03-11 – 2019-03-13 (×4): 250 mg via ORAL
  Filled 2019-03-11 (×4): qty 1

## 2019-03-11 MED ORDER — LIDOCAINE 20MG/ML (2%) 15 ML SYRINGE OPTIME
INTRAMUSCULAR | Status: DC | PRN
  Administered 2019-03-11: 1.5 mg/kg/h via INTRAVENOUS

## 2019-03-11 MED ORDER — SUCCINYLCHOLINE CHLORIDE 200 MG/10ML IV SOSY
PREFILLED_SYRINGE | INTRAVENOUS | Status: DC | PRN
  Administered 2019-03-11: 120 mg via INTRAVENOUS

## 2019-03-11 MED ORDER — LIDOCAINE HCL 2 % IJ SOLN
INTRAMUSCULAR | Status: AC
  Filled 2019-03-11: qty 20

## 2019-03-11 MED ORDER — BUPIVACAINE-EPINEPHRINE (PF) 0.25% -1:200000 IJ SOLN
INTRAMUSCULAR | Status: DC | PRN
  Administered 2019-03-11: 60 mL

## 2019-03-11 MED ORDER — DIPHENHYDRAMINE HCL 50 MG/ML IJ SOLN: 12.5000 mg | Freq: Four times a day (QID) | INTRAMUSCULAR | Status: DC | PRN

## 2019-03-11 MED ORDER — DEXAMETHASONE SODIUM PHOSPHATE 10 MG/ML IJ SOLN
INTRAMUSCULAR | Status: DC | PRN
  Administered 2019-03-11: 4 mg via INTRAVENOUS

## 2019-03-11 MED ORDER — BUPIVACAINE LIPOSOME 1.3 % IJ SUSP
INTRAMUSCULAR | Status: DC | PRN
  Administered 2019-03-11: 20 mL

## 2019-03-11 MED ORDER — SODIUM CHLORIDE 0.9 % IV SOLN
2.0000 g | INTRAVENOUS | Status: AC
  Administered 2019-03-11: 2 g via INTRAVENOUS
  Filled 2019-03-11: qty 2

## 2019-03-11 MED ORDER — SODIUM CHLORIDE 0.9 % IV SOLN
2.0000 g | Freq: Two times a day (BID) | INTRAVENOUS | Status: AC
  Administered 2019-03-11: 2 g via INTRAVENOUS
  Filled 2019-03-11: qty 2

## 2019-03-11 MED ORDER — METOPROLOL TARTRATE 5 MG/5ML IV SOLN: 5.0000 mg | Freq: Four times a day (QID) | INTRAVENOUS | Status: DC | PRN

## 2019-03-11 MED ORDER — GABAPENTIN 100 MG PO CAPS
200.0000 mg | ORAL_CAPSULE | Freq: Three times a day (TID) | ORAL | Status: DC
  Administered 2019-03-11 – 2019-03-13 (×5): 200 mg via ORAL
  Filled 2019-03-11 (×5): qty 2

## 2019-03-11 MED ORDER — ENOXAPARIN SODIUM 40 MG/0.4ML ~~LOC~~ SOLN
40.0000 mg | Freq: Once | SUBCUTANEOUS | Status: AC
  Administered 2019-03-11: 40 mg via SUBCUTANEOUS
  Filled 2019-03-11: qty 0.4

## 2019-03-11 MED ORDER — PROCHLORPERAZINE EDISYLATE 10 MG/2ML IJ SOLN
5.0000 mg | Freq: Four times a day (QID) | INTRAMUSCULAR | Status: DC | PRN
  Administered 2019-03-12: 10 mg via INTRAVENOUS
  Filled 2019-03-11: qty 2

## 2019-03-11 MED ORDER — LACTATED RINGERS IV SOLN: INTRAVENOUS | Status: AC

## 2019-03-11 MED ORDER — LIP MEDEX EX OINT
1.0000 "application " | TOPICAL_OINTMENT | Freq: Two times a day (BID) | CUTANEOUS | Status: DC
  Administered 2019-03-11 – 2019-03-12 (×3): 1 via TOPICAL
  Filled 2019-03-11 (×2): qty 7

## 2019-03-11 MED ORDER — TRAMADOL HCL 50 MG PO TABS
50.0000 mg | ORAL_TABLET | Freq: Four times a day (QID) | ORAL | Status: DC | PRN
  Administered 2019-03-12: 50 mg via ORAL
  Administered 2019-03-12: 100 mg via ORAL
  Filled 2019-03-11: qty 2
  Filled 2019-03-11: qty 1

## 2019-03-11 MED ORDER — METHOCARBAMOL 1000 MG/10ML IJ SOLN
1000.0000 mg | Freq: Four times a day (QID) | INTRAVENOUS | Status: DC | PRN
  Administered 2019-03-12 – 2019-03-13 (×2): 1000 mg via INTRAVENOUS
  Filled 2019-03-11 (×3): qty 10

## 2019-03-11 MED ORDER — SODIUM CHLORIDE 0.9 % IV SOLN
INTRAVENOUS | Status: DC | PRN
  Administered 2019-03-11: 40 ug/min via INTRAVENOUS

## 2019-03-11 MED ORDER — PROPOFOL 10 MG/ML IV BOLUS
INTRAVENOUS | Status: DC | PRN
  Administered 2019-03-11: 170 mg via INTRAVENOUS

## 2019-03-11 MED ORDER — ROCURONIUM BROMIDE 10 MG/ML (PF) SYRINGE
PREFILLED_SYRINGE | INTRAVENOUS | Status: AC
  Filled 2019-03-11: qty 10

## 2019-03-11 MED ORDER — 0.9 % SODIUM CHLORIDE (POUR BTL) OPTIME
TOPICAL | Status: DC | PRN
  Administered 2019-03-11: 2000 mL

## 2019-03-11 MED ORDER — ROCURONIUM BROMIDE 50 MG/5ML IV SOSY
PREFILLED_SYRINGE | INTRAVENOUS | Status: DC | PRN
  Administered 2019-03-11: 60 mg via INTRAVENOUS
  Administered 2019-03-11: 10 mg via INTRAVENOUS

## 2019-03-11 MED ORDER — BISACODYL 5 MG PO TBEC: 20.0000 mg | DELAYED_RELEASE_TABLET | Freq: Once | ORAL | Status: DC

## 2019-03-11 MED ORDER — KETAMINE HCL 10 MG/ML IJ SOLN
INTRAMUSCULAR | Status: AC
  Filled 2019-03-11: qty 1

## 2019-03-11 MED ORDER — DEXAMETHASONE SODIUM PHOSPHATE 10 MG/ML IJ SOLN
INTRAMUSCULAR | Status: AC
  Filled 2019-03-11: qty 1

## 2019-03-11 MED ORDER — ONDANSETRON HCL 4 MG PO TABS: 4.0000 mg | ORAL_TABLET | Freq: Four times a day (QID) | ORAL | Status: DC | PRN

## 2019-03-11 MED ORDER — FENTANYL CITRATE (PF) 250 MCG/5ML IJ SOLN
INTRAMUSCULAR | Status: AC
  Filled 2019-03-11: qty 5

## 2019-03-11 MED ORDER — PROPOFOL 10 MG/ML IV BOLUS
INTRAVENOUS | Status: AC
  Filled 2019-03-11: qty 20

## 2019-03-11 MED ORDER — ACETAMINOPHEN 500 MG PO TABS
1000.0000 mg | ORAL_TABLET | Freq: Four times a day (QID) | ORAL | Status: DC
  Administered 2019-03-11 – 2019-03-13 (×6): 1000 mg via ORAL
  Filled 2019-03-11 (×6): qty 2

## 2019-03-11 MED ORDER — SUGAMMADEX SODIUM 200 MG/2ML IV SOLN
INTRAVENOUS | Status: DC | PRN
  Administered 2019-03-11: 200 mg via INTRAVENOUS

## 2019-03-11 MED ORDER — MAGIC MOUTHWASH
15.0000 mL | Freq: Four times a day (QID) | ORAL | Status: DC | PRN
  Filled 2019-03-11: qty 15

## 2019-03-11 MED ORDER — ENOXAPARIN SODIUM 40 MG/0.4ML ~~LOC~~ SOLN
40.0000 mg | SUBCUTANEOUS | Status: DC
  Administered 2019-03-12 – 2019-03-13 (×2): 40 mg via SUBCUTANEOUS
  Filled 2019-03-11 (×2): qty 0.4

## 2019-03-11 MED ORDER — NEOMYCIN SULFATE 500 MG PO TABS: 1000.0000 mg | ORAL_TABLET | ORAL | Status: DC

## 2019-03-11 MED ORDER — DIPHENHYDRAMINE HCL 12.5 MG/5ML PO ELIX: 12.5000 mg | ORAL_SOLUTION | Freq: Four times a day (QID) | ORAL | Status: DC | PRN

## 2019-03-11 MED ORDER — FENTANYL CITRATE (PF) 100 MCG/2ML IJ SOLN
INTRAMUSCULAR | Status: DC | PRN
  Administered 2019-03-11 (×9): 50 ug via INTRAVENOUS

## 2019-03-11 MED ORDER — ONDANSETRON HCL 4 MG/2ML IJ SOLN
INTRAMUSCULAR | Status: DC | PRN
  Administered 2019-03-11: 4 mg via INTRAVENOUS

## 2019-03-11 MED ORDER — ONDANSETRON HCL 4 MG/2ML IJ SOLN
INTRAMUSCULAR | Status: AC
  Filled 2019-03-11: qty 2

## 2019-03-11 MED ORDER — SODIUM CHLORIDE 0.9 % IV SOLN
INTRAVENOUS | Status: DC | PRN
  Administered 2019-03-11: 1000 mL via INTRAPERITONEAL

## 2019-03-11 MED ORDER — ENSURE SURGERY PO LIQD
237.0000 mL | Freq: Two times a day (BID) | ORAL | Status: DC
  Administered 2019-03-12: 237 mL via ORAL
  Filled 2019-03-11 (×4): qty 237

## 2019-03-11 MED ORDER — HYDROMORPHONE HCL 1 MG/ML IJ SOLN
0.5000 mg | INTRAMUSCULAR | Status: DC | PRN
  Administered 2019-03-11 – 2019-03-12 (×2): 1 mg via INTRAVENOUS
  Filled 2019-03-11: qty 2
  Filled 2019-03-11 (×2): qty 1

## 2019-03-11 MED ORDER — INSULIN ASPART 100 UNIT/ML ~~LOC~~ SOLN
0.0000 [IU] | Freq: Three times a day (TID) | SUBCUTANEOUS | Status: DC
  Administered 2019-03-12: 17:00:00 3 [IU] via SUBCUTANEOUS
  Administered 2019-03-12: 13:00:00 2 [IU] via SUBCUTANEOUS
  Administered 2019-03-13: 13:00:00 3 [IU] via SUBCUTANEOUS

## 2019-03-11 MED ORDER — HYDRALAZINE HCL 20 MG/ML IJ SOLN: 10.0000 mg | INTRAMUSCULAR | Status: DC | PRN

## 2019-03-11 MED ORDER — LIDOCAINE 2% (20 MG/ML) 5 ML SYRINGE
INTRAMUSCULAR | Status: DC | PRN
  Administered 2019-03-11: 75 mg via INTRAVENOUS

## 2019-03-11 MED ORDER — METRONIDAZOLE 500 MG PO TABS: 1000.0000 mg | ORAL_TABLET | ORAL | Status: DC

## 2019-03-11 MED ORDER — PROCHLORPERAZINE MALEATE 10 MG PO TABS: 10.0000 mg | ORAL_TABLET | Freq: Four times a day (QID) | ORAL | Status: DC | PRN

## 2019-03-11 MED ORDER — LACTATED RINGERS IR SOLN
Status: DC | PRN
  Administered 2019-03-11: 1000 mL

## 2019-03-11 MED ORDER — ALUM & MAG HYDROXIDE-SIMETH 200-200-20 MG/5ML PO SUSP: 30.0000 mL | Freq: Four times a day (QID) | ORAL | Status: DC | PRN

## 2019-03-11 MED ORDER — LACTATED RINGERS IV SOLN
INTRAVENOUS | Status: DC
  Administered 2019-03-11 (×4): via INTRAVENOUS

## 2019-03-11 MED ORDER — KETAMINE HCL 10 MG/ML IJ SOLN
INTRAMUSCULAR | Status: DC | PRN
  Administered 2019-03-11: 40 mg via INTRAVENOUS

## 2019-03-11 MED ORDER — SUCCINYLCHOLINE CHLORIDE 200 MG/10ML IV SOSY
PREFILLED_SYRINGE | INTRAVENOUS | Status: AC
  Filled 2019-03-11: qty 10

## 2019-03-11 MED ORDER — GABAPENTIN 300 MG PO CAPS
300.0000 mg | ORAL_CAPSULE | ORAL | Status: AC
  Administered 2019-03-11: 13:00:00 300 mg via ORAL
  Filled 2019-03-11: qty 1

## 2019-03-11 SURGICAL SUPPLY — 113 items
APPLIER CLIP 5 13 M/L LIGAMAX5 (MISCELLANEOUS)
APPLIER CLIP ROT 10 11.4 M/L (STAPLE)
BLADE EXTENDED COATED 6.5IN (ELECTRODE) ×4 IMPLANT
CANNULA REDUC XI 12-8 STAPL (CANNULA) ×1
CANNULA REDUC XI 12-8MM STAPL (CANNULA) ×1
CANNULA REDUCER 12-8 DVNC XI (CANNULA) ×2 IMPLANT
CELLS DAT CNTRL 66122 CELL SVR (MISCELLANEOUS) IMPLANT
CHLORAPREP W/TINT 26 (MISCELLANEOUS) ×4 IMPLANT
CLIP APPLIE 5 13 M/L LIGAMAX5 (MISCELLANEOUS) IMPLANT
CLIP APPLIE ROT 10 11.4 M/L (STAPLE) IMPLANT
CLIP VESOLOCK LG 6/CT PURPLE (CLIP) IMPLANT
CLIP VESOLOCK MED LG 6/CT (CLIP) IMPLANT
COVER SURGICAL LIGHT HANDLE (MISCELLANEOUS) ×8 IMPLANT
COVER TIP SHEARS 8 DVNC (MISCELLANEOUS) ×2 IMPLANT
COVER TIP SHEARS 8MM DA VINCI (MISCELLANEOUS) ×2
COVER WAND RF STERILE (DRAPES) IMPLANT
DECANTER SPIKE VIAL GLASS SM (MISCELLANEOUS) ×4 IMPLANT
DEVICE TROCAR PUNCTURE CLOSURE (ENDOMECHANICALS) IMPLANT
DRAIN CHANNEL 19F RND (DRAIN) IMPLANT
DRAPE ARM DVNC X/XI (DISPOSABLE) ×8 IMPLANT
DRAPE COLUMN DVNC XI (DISPOSABLE) ×2 IMPLANT
DRAPE DA VINCI XI ARM (DISPOSABLE) ×8
DRAPE DA VINCI XI COLUMN (DISPOSABLE) ×2
DRAPE SURG IRRIG POUCH 19X23 (DRAPES) ×4 IMPLANT
DRSG OPSITE POSTOP 4X10 (GAUZE/BANDAGES/DRESSINGS) IMPLANT
DRSG OPSITE POSTOP 4X6 (GAUZE/BANDAGES/DRESSINGS) ×4 IMPLANT
DRSG OPSITE POSTOP 4X8 (GAUZE/BANDAGES/DRESSINGS) IMPLANT
DRSG TEGADERM 2-3/8X2-3/4 SM (GAUZE/BANDAGES/DRESSINGS) ×20 IMPLANT
DRSG TEGADERM 4X4.75 (GAUZE/BANDAGES/DRESSINGS) ×4 IMPLANT
ELECT PENCIL ROCKER SW 15FT (MISCELLANEOUS) ×4 IMPLANT
ELECT REM PT RETURN 15FT ADLT (MISCELLANEOUS) ×4 IMPLANT
ENDOLOOP SUT PDS II  0 18 (SUTURE)
ENDOLOOP SUT PDS II 0 18 (SUTURE) IMPLANT
EVACUATOR SILICONE 100CC (DRAIN) IMPLANT
GAUZE SPONGE 2X2 8PLY STRL LF (GAUZE/BANDAGES/DRESSINGS) ×2 IMPLANT
GAUZE SPONGE 4X4 12PLY STRL (GAUZE/BANDAGES/DRESSINGS) IMPLANT
GLOVE BIO SURGEON STRL SZ 6 (GLOVE) ×8 IMPLANT
GLOVE BIO SURGEON STRL SZ 6.5 (GLOVE) ×6 IMPLANT
GLOVE BIO SURGEONS STRL SZ 6.5 (GLOVE) ×2
GLOVE BIOGEL PI IND STRL 6.5 (GLOVE) ×8 IMPLANT
GLOVE BIOGEL PI IND STRL 7.0 (GLOVE) ×4 IMPLANT
GLOVE BIOGEL PI INDICATOR 6.5 (GLOVE) ×8
GLOVE BIOGEL PI INDICATOR 7.0 (GLOVE) ×4
GLOVE ECLIPSE 6.5 STRL STRAW (GLOVE) ×8 IMPLANT
GLOVE ECLIPSE 8.0 STRL XLNG CF (GLOVE) ×20 IMPLANT
GLOVE INDICATOR 8.0 STRL GRN (GLOVE) ×20 IMPLANT
GLOVE SURG SS PI 6.5 STRL IVOR (GLOVE) ×8 IMPLANT
GOWN STRL REUS W/ TWL LRG LVL3 (GOWN DISPOSABLE) ×8 IMPLANT
GOWN STRL REUS W/TWL LRG LVL3 (GOWN DISPOSABLE) ×8
GOWN STRL REUS W/TWL XL LVL3 (GOWN DISPOSABLE) ×20 IMPLANT
GRASPER SUT TROCAR 14GX15 (MISCELLANEOUS) ×4 IMPLANT
HOLDER FOLEY CATH W/STRAP (MISCELLANEOUS) ×4 IMPLANT
IRRIG SUCT STRYKERFLOW 2 WTIP (MISCELLANEOUS) ×4
IRRIGATION SUCT STRKRFLW 2 WTP (MISCELLANEOUS) ×2 IMPLANT
KIT PROCEDURE DA VINCI SI (MISCELLANEOUS)
KIT PROCEDURE DVNC SI (MISCELLANEOUS) IMPLANT
KIT SIGMOIDOSCOPE (SET/KITS/TRAYS/PACK) ×4 IMPLANT
KIT TURNOVER KIT A (KITS) IMPLANT
NEEDLE INSUFFLATION 14GA 120MM (NEEDLE) ×4 IMPLANT
PACK CARDIOVASCULAR III (CUSTOM PROCEDURE TRAY) ×4 IMPLANT
PACK COLON (CUSTOM PROCEDURE TRAY) ×4 IMPLANT
PAD POSITIONING PINK XL (MISCELLANEOUS) ×4 IMPLANT
PORT LAP GEL ALEXIS MED 5-9CM (MISCELLANEOUS) ×4 IMPLANT
PROTECTOR NERVE ULNAR (MISCELLANEOUS) ×4 IMPLANT
RTRCTR WOUND ALEXIS 18CM MED (MISCELLANEOUS)
SCISSORS LAP 5X35 DISP (ENDOMECHANICALS) ×4 IMPLANT
SEAL CANN UNIV 5-8 DVNC XI (MISCELLANEOUS) ×8 IMPLANT
SEAL XI 5MM-8MM UNIVERSAL (MISCELLANEOUS) ×8
SEALER VESSEL DA VINCI XI (MISCELLANEOUS) ×2
SEALER VESSEL EXT DVNC XI (MISCELLANEOUS) ×2 IMPLANT
SLEEVE ADV FIXATION 5X100MM (TROCAR) IMPLANT
SOLUTION ELECTROLUBE (MISCELLANEOUS) ×4 IMPLANT
SPONGE GAUZE 2X2 STER 10/PKG (GAUZE/BANDAGES/DRESSINGS) ×2
STAPLER 45 BLU RELOAD XI (STAPLE) IMPLANT
STAPLER 45 BLUE RELOAD XI (STAPLE)
STAPLER 45 GREEN RELOAD XI (STAPLE) ×4
STAPLER 45 GRN RELOAD XI (STAPLE) ×4 IMPLANT
STAPLER CANNULA SEAL DVNC XI (STAPLE) ×2 IMPLANT
STAPLER CANNULA SEAL XI (STAPLE) ×2
STAPLER ECHELON POWER CIR 31 (STAPLE) ×4 IMPLANT
STAPLER SHEATH (SHEATH) ×2
STAPLER SHEATH ENDOWRIST DVNC (SHEATH) ×2 IMPLANT
SURGILUBE 2OZ TUBE FLIPTOP (MISCELLANEOUS) ×4 IMPLANT
SUT MNCRL AB 4-0 PS2 18 (SUTURE) ×8 IMPLANT
SUT PDS AB 1 CT1 27 (SUTURE) ×8 IMPLANT
SUT PDS AB 1 TP1 96 (SUTURE) IMPLANT
SUT PROLENE 0 CT 2 (SUTURE) IMPLANT
SUT PROLENE 2 0 KS (SUTURE) ×4 IMPLANT
SUT PROLENE 2 0 SH DA (SUTURE) IMPLANT
SUT SILK 2 0 (SUTURE)
SUT SILK 2 0 SH CR/8 (SUTURE) IMPLANT
SUT SILK 2-0 18XBRD TIE 12 (SUTURE) IMPLANT
SUT SILK 3 0 (SUTURE) ×2
SUT SILK 3 0 SH CR/8 (SUTURE) ×4 IMPLANT
SUT SILK 3-0 18XBRD TIE 12 (SUTURE) ×2 IMPLANT
SUT V-LOC BARB 180 2/0GR6 GS22 (SUTURE)
SUT VIC AB 2-0 SH 27 (SUTURE) ×2
SUT VIC AB 2-0 SH 27X BRD (SUTURE) ×2 IMPLANT
SUT VIC AB 3-0 SH 18 (SUTURE) IMPLANT
SUT VIC AB 3-0 SH 27 (SUTURE)
SUT VIC AB 3-0 SH 27XBRD (SUTURE) IMPLANT
SUT VICRYL 0 UR6 27IN ABS (SUTURE) ×4 IMPLANT
SUTURE V-LC BRB 180 2/0GR6GS22 (SUTURE) IMPLANT
SYR 10ML ECCENTRIC (SYRINGE) ×4 IMPLANT
SYS LAPSCP GELPORT 120MM (MISCELLANEOUS)
SYSTEM LAPSCP GELPORT 120MM (MISCELLANEOUS) IMPLANT
TAPE UMBILICAL COTTON 1/8X30 (MISCELLANEOUS) IMPLANT
TOWEL OR NON WOVEN STRL DISP B (DISPOSABLE) ×4 IMPLANT
TRAY FOLEY MTR SLVR 16FR STAT (SET/KITS/TRAYS/PACK) ×4 IMPLANT
TROCAR ADV FIXATION 5X100MM (TROCAR) ×4 IMPLANT
TUBING CONNECTING 10 (TUBING) ×6 IMPLANT
TUBING CONNECTING 10' (TUBING) ×2
TUBING INSUFFLATION 10FT LAP (TUBING) ×4 IMPLANT

## 2019-03-11 NOTE — Interval H&P Note (Signed)
History and Physical Interval Note:  03/11/2019 1:54 PM  Oscar Mccarty  has presented today for surgery, with the diagnosis of COLON CANCER.  The various methods of treatment have been discussed with the patient and family. After consideration of risks, benefits and other options for treatment, the patient has consented to  Procedure(s): XI ROBOTIC ASSISTED RESECTION OF RECTOSIGMOID COLON (N/A) RIGID PROCTOSCOPY (N/A) as a surgical intervention.  The patient's history has been reviewed, patient examined, no change in status, stable for surgery.  I have reviewed the patient's chart and labs.  Questions were answered to the patient's satisfaction.    I have re-reviewed the the patient's records, history, medications, and allergies.  I have re-examined the patient.  I again discussed intraoperative plans and goals of post-operative recovery.  The patient agrees to proceed.  Oscar Mccarty  1966-07-12 [id number]  Patient Care Team: Enid Skeens., MD as PCP - General (Family Medicine) Ladell Pier, MD as Consulting Physician (Oncology) Jackquline Denmark, MD as Consulting Physician (Gastroenterology) Michael Boston, MD as Consulting Physician (General Surgery)  Patient Active Problem List   Diagnosis Date Noted  . History of COVID-19 respiratory pneumonitis June 2020 03/11/2019    Priority: High  . Acute respiratory disease due to COVID-19 virus - June 2020 01/05/2019  . Diabetes mellitus, type 2 ([place]) 12/25/2018  . Adenocarcinoma of sigmoid colon ([place]) 12/22/2018  . Bronchitis 09/17/2011  . Allergic rhinitis 12/12/2010  . Chronic headaches 12/12/2010  . Depression 12/12/2010  . Cough 12/12/2010    Past Medical History:  Diagnosis Date  . Acute respiratory disease due to COVID-19 virus - June 2020 01/05/2019  . Adenocarcinoma of sigmoid colon ([place]) 12/22/2018   Malignant partially obstructing tumor in the proximal sigmoid colon  . Allergic rhinitis   . Chronic headaches   . Depression    . Diabetes mellitus, type 2 ([place]) 12/25/2018   new diagnosis, A1C 9.2% 12/25/2018  . GERD (gastroesophageal reflux disease)   . History of COVID-19 respiratory pneumonitis June 2020 03/11/2019    Past Surgical History:  Procedure Laterality Date  . COLONOSCOPY    . no surgical history      Social History   Socioeconomic History  . Marital status: Married    Spouse name: Not on file  . Number of children: Not on file  . Years of education: Not on file  . Highest education level: Not on file  Occupational History  . Not on file  Social Needs  . Financial resource strain: Not on file  . Food insecurity    Worry: Not on file    Inability: Not on file  . Transportation needs    Medical: Not on file    Non-medical: Not on file  Tobacco Use  . Smoking status: Never Smoker  . Smokeless tobacco: Never Used  Substance and Sexual Activity  . Alcohol use: Not Currently    Comment: occ  . Drug use: No  . Sexual activity: Not on file  Lifestyle  . Physical activity    Days per week: Not on file    Minutes per session: Not on file  . Stress: Not on file  Relationships  . Social Herbalist on phone: Not on file    Gets together: Not on file    Attends religious service: Not on file    Active member of club or organization: Not on file    Attends meetings of clubs or organizations: Not on file  Relationship status: Not on file  . Intimate partner violence    Fear of current or ex partner: Not on file    Emotionally abused: Not on file    Physically abused: Not on file    Forced sexual activity: Not on file  Other Topics Concern  . Not on file  Social History Narrative  . Not on file    Family History  Problem Relation Age of Onset  . Colon cancer Neg Hx   . Esophageal cancer Neg Hx   . Rectal cancer Neg Hx   . Stomach cancer Neg Hx     Medications Prior to Admission  Medication Sig Dispense Refill Last Dose  . ibuprofen (ADVIL) 200 MG tablet Take 400  mg by mouth every 6 (six) hours as needed for headache or moderate pain.   Past Week at Unknown time  . metFORMIN (GLUCOPHAGE) 500 MG tablet Take 1 tablet (500 mg total) by mouth 2 (two) times daily with a meal. 60 tablet 11 03/09/2019  . SUMAtriptan (IMITREX) 50 MG tablet Take 50 mg by mouth every 2 (two) hours as needed for migraine or headache. May repeat in 2 hours if headache persists or recurs.    More than a month at Unknown time    Current Facility-Administered Medications  Medication Dose Route Frequency Provider Last Rate Last Dose  . bupivacaine liposome (EXPAREL) 1.3 % injection 266 mg  20 mL Infiltration Once Michael Boston, MD      . cefoTEtan (CEFOTAN) 2 g in sodium chloride 0.9 % 100 mL IVPB  2 g Intravenous On Call to OR Michael Boston, MD      . clindamycin (CLEOCIN) 900 mg, gentamicin (GARAMYCIN) 240 mg in sodium chloride 0.9 % 1,000 mL for intraperitoneal lavage   Irrigation To OR Michael Boston, MD      . lactated ringers infusion   Intravenous Continuous Hulan Fray L, MD 50 mL/hr at 03/11/19 1247       No Known Allergies  BP 133/82 (BP Location: Right Arm)   Pulse 73   Temp 98.6 F (37 C) (Oral)   Resp 18   Ht 5\' 7"  (1.702 m)   Wt 74.8 kg   SpO2 100%   BMI 25.84 kg/m   Labs: No results found for this or any previous visit (from the past 48 hour(s)).  Imaging / Studies: No results found.   [name], M.D., F.A.C.S. Gastrointestinal and Minimally Invasive Surgery [facility] [address] [place], [place] [address] [phone] Main / Paging  03/11/2019 1:54 PM    [name]

## 2019-03-11 NOTE — Op Note (Signed)
03/11/2019  5:12 PM  PATIENT:  Oscar Mccarty  53 y.o. male  Patient Care Team: Enid Skeens., MD as PCP - General (Family Medicine) Ladell Pier, MD as Consulting Physician (Oncology) Jackquline Denmark, MD as Consulting Physician (Gastroenterology) Michael Boston, MD as Consulting Physician (General Surgery)  PRE-OPERATIVE DIAGNOSIS:  SIGMOID COLON CANCER  POST-OPERATIVE DIAGNOSIS:  DISTAL DESCENDING COLON CANCER  PROCEDURE:   XI ROBOTIC ASSISTED PARTIAL COLECTOMY (DESCENDING & SIGMOID COLON) MOBILIZATION OF SPLENIC FLEXURE BILATERAL TAP BLOCK RIGID PROCTOSCOPY  SURGEON:  [name], MD  ASSISTANT: Carlena Hurl, PA-C   ANESTHESIA:   local and general  EBL:  Total I/O In: 3000 [I.V.:3000] Out: 200 [Urine:150; Blood:50]  Delay start of Pharmacological VTE agent (>24hrs) due to surgical blood loss or risk of bleeding:  no  DRAINS: No  SPECIMEN: Descending and sigmoid colon.  Open end proximal.  Strongest tattooed near the obvious cancer  DISPOSITION OF SPECIMEN:  PATHOLOGY  COUNTS:  YES  PLAN OF CARE: Admit to inpatient   PATIENT DISPOSITION:  PACU - hemodynamically stable.  INDICATION:    Pleasant gentleman developed worsening rectal bleeding.  Found to have bulky tumor in upper sigmoid colon.  Surgery recommended.  He ended up developing COVID pneumonitis requiring hospitalization in June.  He is recovered and is now COVID negative.  Because of his bleeding surgery moved up to this month.  I recommended segmental resection:  The anatomy & physiology of the digestive tract was discussed.  The pathophysiology was discussed.  Natural history risks without surgery was discussed.   I worked to give an overview of the disease and the frequent need to have multispecialty involvement.  I feel the risks of no intervention will lead to serious problems that outweigh the operative risks; therefore, I recommended a partial colectomy to remove the pathology.  Laparoscopic &  open techniques were discussed.   Risks such as bleeding, infection, abscess, leak, reoperation, possible ostomy, hernia, heart attack, death, and other risks were discussed.  I noted a good likelihood this will help address the problem.   Goals of post-operative recovery were discussed as well.  We will work to minimize complications.  Educational materials on the pathology had been given in the office.  Questions were answered.    The patient expressed understanding & wished to proceed with surgery.  OR FINDINGS:   Patient had redundant descending and sigmoid colon with bulky tumor at the distal descending colon.  Left conization done.  Splenic flexure mobilization done to have tension-free reaching to the pelvis.  No obvious metastatic disease on visceral parietal peritoneum or liver.  The anastomosis rests 14 cm from the anal verge by rigid proctoscopy.  DESCRIPTION:   Informed consent was confirmed.  The patient underwent general anaesthesia without difficulty.  The patient was positioned appropriately.  VTE prevention in place.  The patient was clipped, prepped, & draped in a sterile fashion.  Surgical timeout confirmed our plan.  The patient was positioned in reverse Trendelenburg.  Abdominal entry was gained using Varess technique at the left subcostal ridge on the anterior abdominal wall.  No elevated EtCO2 noted.  Port placed.  Camera inspection revealed no injury.  Extra ports were carefully placed under direct laparoscopic visualization.  Could see strongest tattoo at the distal descending colon.  Sigmoid rather redundant and corkscrewed upon itself.  I reflected the greater omentum and the upper abdomen the small bowel in the upper abdomen.  The patient was carefully positioned.  The  Intuitive daVinci robot was docked with camera & instruments carefully placed.  I scored the base of peritoneum of the medial side of the mesentery of the elevated left colon from the ligament of Treitz  to the peritoneal reflection of the mid rectum.   I elevated the sigmoid mesentery and entered into the retro-mesenteric plane. We were able to identify the left ureter and gonadal vessels. We kept those posterior within the retroperitoneum and elevated the left colon mesentery off that. I did isolate the inferior mesenteric artery (IMA) pedicle but did not ligate it yet.  I continued distally and got into the avascular plane posterior to the mesorectum.  His tissues were somewhat fibrotic in areas and very stretched out/rubbery and others.  Eventually I was able to  mobilize the rectum as well by freeing the mesorectum off the sacrum.  Identified the nervi ergentes days and kept them in the natural retroperitoneal midline position in its usual wishbone pattern.  I mobilized the peritoneal coverings towards the peritoneal reflection on both the right and left sides down to the junction between the proximal and mid rectum.  I stayed away from the right and left ureters.  I kept the lateral vascular pedicles to the rectum intact.  I was able to come between the window between the inferior mesenteric vein and artery.  Free off the mid and proximal colon off the kidney.  Identify and preserve the ureter and gonadal vessels and keep them in the natural retroperitoneal position.  I skeletonized the lymph nodes off the inferior mesenteric artery pedicle.  I went down to its takeoff from the aorta.  I isolated the inferior mesenteric vein off of the ligament of Treitz just cephalad to that as well.  After confirming the left ureter was out of the way, I went ahead and ligated the inferior mesenteric artery pedicle just near its takeoff from the aorta.  I did ligate the inferior mesenteric vein in a similar fashion.  We ensured hemostasis. I skeletonized the mesorectum at the junction at the proximal rectum for the distal point of resection.  I mobilized the left colon in a lateral to medial fashion off the line of Toldt  up towards the splenic flexure to ensure good mobilization of the remaining left colon to reach into the pelvis.  Unfortunately he did not seem to wish to mobilize well.  Therefore I decided to do more formal splenic flexure mobilization.  I reposition the patient in reversal [place].  Transected the greater omentum off the mid transverse colon and follow that distally.  Was able to get the lesser sac and free the great omentum towards the splenic flexure.  I could see his pancreas and I was able to get a window between the inferior pancreatic edge and the retroperitoneum to connect with my prior retroperitoneal dissection.  Without I transected the retroperitoneal attachments of the distal transverse colon and splenic flexure off the retroperitoneum especially to the kidney.  Did that towards the midline.  With that I got excellent mobilization of the entire left colon.  That released the kinked up splenic flexure straighten it out and it came down much more easily off tension.  Repositioned the patient in Trendelenburg.  I skeletonized at the proximal mesorectum and transected at the proximal rectum using a robotic 45 mm stapler.  I chose a region at the proximal descending colon close to the splenic flexure that was soft and easily reached down to the rectal stump.  I transected  the mesentery of the colon radially to preserve remaining colon blood supply, ensuring that the inferior mesenteric vein and arterial pedicles were with the specimen.  Preserving a good marginal artery.  I created an extraction incision through a small Pfannenstiel incision in the suprapubic region.  Placed a wound protector.  I was able to eviscerate the rectosigmoid and descending colon out the wound.   I clamped the colon proximal to this area using a reusable pursestringer device.  Passed a 2-0 Keith needle. I transected at the descending/sigmoid junction with a scalpel. I got healthy bleeding mucosa.  We sent the  rectosigmoid colon specimen off to go to pathology.  We sized the colon orifice.  I chose a 31 EEA anvil stapler system.  I reinforced the prolene pursestring with interrupted silk suture.  I placed the anvil to the open end of the proximal remaining colon and closed around it using the pursestring.    We did copious irrigation with crystalloid solution.  Hemostasis was good.  The distal end of the remaining colon easily reached down to the rectal stump, therefore, further colone mobilization was not needed.      Ms Lemar Lofty scrubbed down and did gentle anal dilation and advanced the EEA stapler up the rectal stump. The spike was brought out at the provimal end of the rectal stump under direct visualization.  I attached the anvil of the proximal colon the spike of the stapler. Anvil was tightened down and held clamped for 60 seconds. The EEA stapler was fired and held clamped for 30 seconds. The stapler was released & removed. We noted 2 excellent anastomotic rings. Blue stitch is in the proximal ring.  Ms Lemar Lofty We did a final irrigation of antibiotic solution (900 mg clindamycin/240 mg gentamicin in a liter of crystalloid) & held that for the pelvic air leak test .  The rectum was insufflated the rectum while clamping the colon proximal to that anastomosis.  There was a negative air leak test. There was no tension of mesentery or bowel at the anastomosis.   Tissues looked viable.  Ureters & bowel uninjured.  The anastomosis looked healthy.   Endoluminal gas was evacuated.  Ports & wound protector removed.   I did rigid proctoscopy noted the anastomosis was at 14 cm from the anal verge consistent with the proximal rectum. We changed gloves & redraped the patient per colon SSI prevention protocol.  We aspirated the antibiotic irrigation.  Hemostasis was good.  Sterile unused instruments were used from this point.  I closed the skin at the port sites using Monocryl stitch and sterile dressing.  I closed the  extraction wound using a 0 Vicryl vertical peritoneal closure and a #1 PDS transverse anterior rectal fascial closure like a small Pfannenstiel closure. I closed the skin at the port sites and Pfannenstiel incision with 4-0 Monocryl stitches.    Patient is being extubated go to recovery room. I had discussed postop care with the patient in detail the office & in the holding area. Instructions are written. I discussed operative findings, updated the patient's status, discussed probable steps to recovery, and gave postoperative recommendations to the patient's significant other.  Myrtie Soman.   Recommendations were made.  Questions were answered.  She expressed understanding & appreciation.  [name], M.D., F.A.C.S. Gastrointestinal and Minimally Invasive Surgery [facility] [address], [place], [place] [address] [phone] Main / Paging

## 2019-03-11 NOTE — H&P (Signed)
Oscar Mccarty Documented: 02/02/2019 3:00 PM Location: [facility] Patient #: [id number] DOB: July 13, 1966 Married / Language: English / Race: White Male  History of Present Illness Oscar Hector MD; 02/02/2019 5:13 PM) The patient is a 53 year old male who presents with colorectal cancer. Note for "Colorectal cancer": ` ` ` Patient sent for surgical consultation at the request of Dr. [name] gastroenterology  Chief Complaint: Rectosigmoid cancer. CEA 1.6. 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 Reformation made for surgical consultation. Had worse for respiratory discomfort and was found to be positive for Covid pneumonitis. He was admitted hospital for 5 days and discharged on 20 June. Discussion made with his pulmonologist recommended waiting 3 weeks before considering surgery. That would've been 11 July, last weekend.  Patient comes today by himself. He is had some unintentional weight loss. He was diagnosed with diabetes with elevated inguinal globin A1c. He is avoid sugars and breads. He is on oral hypoglycemics. He wanted to talk more about his diabetes anything else. He normally moves his bowels every day. However he had been increasingly constipated with occasional bleeding and cramping. Got a colonoscopy. Found to have mass in sigmoid colon and biopsy consistent with adenocarcinoma. There is discussion about seeing medical oncology. Recommendations he surgery first. No personal nor family history of inflammatory bowel disease, irritable bowel syndrome, allergy such as Celiac Sprue, dietary/dairy problems, colitis, ulcers nor gastritis. No recent sick contacts/gastroenteritis. No travel outside the country. No changes in diet. No dysphagia to solids or liquids. No significant heartburn or reflux. No hematemesis, coffee ground emesis. No evidence of prior gastric/peptic  ulceration. Normally can walk an hour without difficulty. Into saucerization he's feeling little more worn-out. He can still walk about 30 minutes at a go.  Patient was Covid positive on 01/05/2019.  Was in the hospital 5 days and is asymptomatic now, 3.5 weeks later.  According Dr. [name] with the [facility] pulmonary, he should be Covid free head 3 weeks and does not require preoperative testing..  I do not know if he needs to be retested is my understanding he does not, but we will defer to anesthesia if further repeat COIVD testing warranted.   (Review of systems as stated in this history (HPI) or in the review of systems. Otherwise all other 12 point ROS are negative) ` ` `   Problem List/Past Medical Oscar Hector, MD; 02/02/2019 3:15 PM) COVID-19 WITH PULMONARY COMORBIDITY (U07.1, J98.4) Admitted 01/05/2019 with Covid pneumonitis. Discharge 6/20. Three-week convalescence recommended before considering surgery by Dr [name] of [facility] Pulmonary = 01/31/2019  Past Surgical History ([place], [place]; 02/02/2019 3:00 PM) No pertinent past surgical history  Diagnostic Studies History Oscar Mccarty, CMA; 02/02/2019 3:00 PM) Colonoscopy within last year  Allergies [facility], CMA; 02/02/2019 3:01 PM) No Known Allergies [02/02/2019]: No Known Drug Allergies [02/02/2019]: Allergies Reconciled  Medication History Oscar Mccarty, CMA; 02/02/2019 3:02 PM) Ondansetron (8MG  Tablet Disint, Oral) Active. glipiZIDE (5MG  Tablet, Oral) Active. metFORMIN HCl (500MG  Tablet, Oral) Active. Medications Reconciled  Other Problems Oscar Hector, MD; 02/02/2019 3:15 PM) Diabetes Mellitus Hemorrhoids Home Oxygen Use     Review of Systems ([place]; 02/02/2019 3:00 PM) Skin Not Present- Change in Wart/Mole, Dryness, Hives, Jaundice, New Lesions, Non-Healing Wounds, Rash and Ulcer. HEENT Not Present- Earache, Hearing Loss, Hoarseness, Nose Bleed, Oral Ulcers,  Ringing in the Ears, Seasonal Allergies, Sinus Pain, Sore Throat, Visual Disturbances, Wears  glasses/contact lenses and Yellow Eyes. Respiratory Not Present- Bloody sputum, Chronic Cough, Difficulty Breathing, Snoring and Wheezing. Breast Not Present- Breast Mass, Breast Pain, Nipple Discharge and Skin Changes. Cardiovascular Not Present- Chest Pain, Difficulty Breathing Lying Down, Leg Cramps, Palpitations, Rapid Heart Rate, Shortness of Breath and Swelling of Extremities. Gastrointestinal Present- Abdominal Pain and Bloating. Not Present- Bloody Stool, Change in Bowel Habits, Chronic diarrhea, Constipation, Difficulty Swallowing, Excessive gas, Gets full quickly at meals, Hemorrhoids, Indigestion, Nausea, Rectal Pain and Vomiting. Male Genitourinary Present- Impotence. Not Present- Blood in Urine, Change in Urinary Stream, Frequency, Nocturia, Painful Urination, Urgency and Urine Leakage.  Vitals (Sabrina Canty CMA; 02/02/2019 3:02 PM) 02/02/2019 3:02 PM Weight: 165.6 lb Height: 67in Body Surface Area: 1.87 m Body Mass Index: 25.94 kg/m  Temp.: 98.31F(Oral)  Pulse: 95 (Regular)  BP: 148/68 (Sitting, Left Arm, Standard)        Physical Exam Oscar Hector MD; 02/02/2019 5:14 PM)  General Mental Status-Alert. General Appearance-Not in acute distress, Not Sickly. Orientation-Oriented X3. Hydration-Well hydrated. Voice-Normal. Note: Pleasant. Chatty. Not toxic. Not sickly.  Integumentary Global Assessment Upon inspection and palpation of skin surfaces of the - Axillae: non-tender, no inflammation or ulceration, no drainage. and Distribution of scalp and body hair is normal. General Characteristics Temperature - normal warmth is noted.  Head and Neck Head-normocephalic, atraumatic with no lesions or palpable masses. Face Global Assessment - atraumatic, no absence of expression. Neck Global Assessment - no abnormal movements, no bruit  auscultated on the right, no bruit auscultated on the left, no decreased range of motion, non-tender. Trachea-midline. Thyroid Gland Characteristics - non-tender.  Eye Eyeball - Left-Extraocular movements intact, No Nystagmus - Left. Eyeball - Right-Extraocular movements intact, No Nystagmus - Right. Cornea - Left-No Hazy - Left. Cornea - Right-No Hazy - Right. Sclera/Conjunctiva - Left-No scleral icterus, No Discharge - Left. Sclera/Conjunctiva - Right-No scleral icterus, No Discharge - Right. Pupil - Left-Direct reaction to light normal. Pupil - Right-Direct reaction to light normal.  ENMT Ears Pinna - Left - no drainage observed, no generalized tenderness observed. Pinna - Right - no drainage observed, no generalized tenderness observed. Nose and Sinuses External Inspection of the Nose - no destructive lesion observed. Inspection of the nares - Left - quiet respiration. Inspection of the nares - Right - quiet respiration. Mouth and Throat Lips - Upper Lip - no fissures observed, no pallor noted. Lower Lip - no fissures observed, no pallor noted. Nasopharynx - no discharge present. Oral Cavity/Oropharynx - Tongue - no dryness observed. Oral Mucosa - no cyanosis observed. Hypopharynx - no evidence of airway distress observed.  Chest and Lung Exam Inspection Movements - Normal and Symmetrical. Accessory muscles - No use of accessory muscles in breathing. Palpation Palpation of the chest reveals - Non-tender. Auscultation Breath sounds - Normal and Clear.  Cardiovascular Auscultation Rhythm - Regular. Murmurs & Other Heart Sounds - Auscultation of the heart reveals - No Murmurs and No Systolic Clicks.  Abdomen Inspection Inspection of the abdomen reveals - No Visible peristalsis and No Abnormal pulsations. Umbilicus - No Bleeding, No Urine drainage. Palpation/Percussion Palpation and Percussion of the abdomen reveal - Soft, Non Tender, No Rebound  tenderness, No Rigidity (guarding) and No Cutaneous hyperesthesia. Note: Abdomen soft. Nontender. Not distended. No umbilical or incisional hernias. No guarding.  Male Genitourinary Sexual Maturity Tanner 5 - Adult hair pattern and Adult penile size and shape. Note: No inguinal hernias. Normal external genitalia. Epididymi, testes, and spermatic cords normal without any masses.  Rectal  Note: Deferred given recent colonoscopy  Peripheral Vascular Upper Extremity Inspection - Left - No Cyanotic nailbeds - Left, Not Ischemic. Inspection - Right - No Cyanotic nailbeds - Right, Not Ischemic.  Neurologic Neurologic evaluation reveals -normal attention span and ability to concentrate, able to name objects and repeat phrases. Appropriate fund of knowledge , normal sensation and normal coordination. Mental Status Affect - not angry, not paranoid. Cranial Nerves-Normal Bilaterally. Gait-Normal.  Neuropsychiatric Mental status exam performed with findings of-able to articulate well with normal speech/language, rate, volume and coherence, thought content normal with ability to perform basic computations and apply abstract reasoning and no evidence of hallucinations, delusions, obsessions or homicidal/suicidal ideation.  Musculoskeletal Global Assessment Spine, Ribs and Pelvis - no instability, subluxation or laxity. Right Upper Extremity - no instability, subluxation or laxity.  Lymphatic Head & Neck  General Head & Neck Lymphatics: Bilateral - Description - No Localized lymphadenopathy. Axillary  General Axillary Region: Bilateral - Description - No Localized lymphadenopathy. Femoral & Inguinal  Generalized Femoral & Inguinal Lymphatics: Left - Description - No Localized lymphadenopathy. Right - Description - No Localized lymphadenopathy.    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  Scheduling surgery.  Plan minimally invasive sigmoid colectomy.  Hopefully robotic.  Surgery delayed due to the [place] & patient being COVID positive himself & hospitalized in late June.  COVID negative now.   The anatomy & physiology of the digestive tract was discussed. The pathophysiology of the colon was discussed. Natural history risks without surgery was discussed. I feel the risks of no intervention will lead to serious problems that outweigh the operative risks; therefore, I recommended a partial colectomy to remove the pathology. Minimally invasive (Robotic/Laparoscopic) & open techniques were discussed.  Risks such as bleeding, infection, abscess, leak, reoperation, possible ostomy, hernia, heart attack, death, and other risks were discussed. I noted a good likelihood this will help address the problem. Goals of post-operative recovery were discussed as well. Need for adequate nutrition, daily bowel regimen and healthy physical activity, to optimize recovery was noted as well. We will work to minimize complications. Educational materials were available as well. Questions were answered. The patient expresses understanding & wishes to proceed with surgery.    COVID-19 WITH PULMONARY COMORBIDITY (U07.1) Story: Admitted 01/05/2019 with Covid pneumonitis. Discharge 6/20. Three-week convalescence recommended before considering surgery by Dr [name] of [facility] Pulmonary = 01/31/2019 Impression: Recover from COVID pneumonitis last month. More than 3 weeks out from discharge & cleared by pulmonary.  F/o COVID testing negative this week of surgery.  Oscar Hector, MD, FACS, MASCRS Gastrointestinal and Minimally Invasive Surgery    [address] [place], [place] [address] [phone] Main / Paging [phone] Fax

## 2019-03-11 NOTE — Transfer of Care (Signed)
Immediate Anesthesia Transfer of Care Note  Patient: [facility]  Procedure(s) Performed: XI ROBOTIC ASSISTED LEFT COLECTOMY, MOBILIZATION OF SPLENIC FLEXURE, BILATERAL TAP BLOCK (N/A Abdomen) RIGID PROCTOSCOPY (N/A )  Patient Location: PACU  Anesthesia Type:General  Level of Consciousness: drowsy and patient cooperative  Airway & Oxygen Therapy: Patient Spontanous Breathing and Patient connected to face mask oxygen  Post-op Assessment: Report given to RN and Post -op Vital signs reviewed and stable  Post vital signs: Reviewed and stable  Last Vitals:  Vitals Value Taken Time  BP 136/111 03/11/19 1723  Temp    Pulse 86 03/11/19 1727  Resp 13 03/11/19 1727  SpO2 100 % 03/11/19 1727  Vitals shown include unvalidated device data.  Last Pain:  Vitals:   03/11/19 1237  TempSrc:   PainSc: 0-No pain         Complications: No apparent anesthesia complications

## 2019-03-11 NOTE — Anesthesia Preprocedure Evaluation (Signed)
Anesthesia Evaluation  Patient identified by MRN, date of birth, ID band Patient awake    Reviewed: Allergy & Precautions, NPO status , Patient's Chart, lab work & pertinent test results  Airway Mallampati: I  TM Distance: >3 FB Neck ROM: Full    Dental no notable dental hx. (+) Teeth Intact, Dental Advisory Given   Pulmonary neg pulmonary ROS,    Pulmonary exam normal breath sounds clear to auscultation       Cardiovascular negative cardio ROS Normal cardiovascular exam Rhythm:Regular Rate:Normal     Neuro/Psych  Headaches, PSYCHIATRIC DISORDERS Depression    GI/Hepatic Neg liver ROS, GERD  ,  Endo/Other  negative endocrine ROSdiabetes  Renal/GU negative Renal ROS  negative genitourinary   Musculoskeletal negative musculoskeletal ROS (+)   Abdominal   Peds  Hematology negative hematology ROS (+)   Anesthesia Other Findings   Reproductive/Obstetrics                             Anesthesia Physical Anesthesia Plan  ASA: III  Anesthesia Plan: General   Post-op Pain Management:    Induction: Intravenous  PONV Risk Score and Plan: 3 and Midazolam, Dexamethasone and Ondansetron  Airway Management Planned: Oral ETT  Additional Equipment:   Intra-op Plan:   Post-operative Plan: Extubation in OR  Informed Consent: I have reviewed the patients History and Physical, chart, labs and discussed the procedure including the risks, benefits and alternatives for the proposed anesthesia with the patient or authorized representative who has indicated his/her understanding and acceptance.     Dental advisory given  Plan Discussed with: CRNA  Anesthesia Plan Comments:         Anesthesia Quick Evaluation

## 2019-03-11 NOTE — Anesthesia Procedure Notes (Signed)
Procedure Name: Intubation Date/Time: 03/11/2019 2:12 PM Performed by: Montel Clock, CRNA Pre-anesthesia Checklist: Patient identified, Emergency Drugs available, Suction available, Patient being monitored and Timeout performed Patient Re-evaluated:Patient Re-evaluated prior to induction Oxygen Delivery Method: Circle system utilized Preoxygenation: Pre-oxygenation with 100% oxygen Induction Type: IV induction and Rapid sequence Laryngoscope Size: Mac and 3 Grade View: Grade I Tube type: Oral Tube size: 7.5 mm Number of attempts: 1 Airway Equipment and Method: Stylet Placement Confirmation: ETT inserted through vocal cords under direct vision,  positive ETCO2 and breath sounds checked- equal and bilateral Secured at: 22 cm Tube secured with: Tape Dental Injury: Teeth and Oropharynx as per pre-operative assessment

## 2019-03-12 LAB — BASIC METABOLIC PANEL
Anion gap: 9 (ref 5–15)
BUN: 9 mg/dL (ref 6–20)
CO2: 22 mmol/L (ref 22–32)
Calcium: 8.1 mg/dL — ABNORMAL LOW (ref 8.9–10.3)
Chloride: 105 mmol/L (ref 98–111)
Creatinine, Ser: 0.79 mg/dL (ref 0.61–1.24)
GFR calc Af Amer: >60 mL/min (ref >60)
GFR calc non Af Amer: >60 mL/min (ref >60)
Glucose, Bld: 166 mg/dL — ABNORMAL HIGH (ref 70–99)
Potassium: 4 mmol/L (ref 3.5–5.1)
Sodium: 136 mmol/L (ref 135–145)

## 2019-03-12 LAB — CBC
HCT: 33.5 % — ABNORMAL LOW (ref 39.0–52.0)
Hemoglobin: 11.1 g/dL — ABNORMAL LOW (ref 13.0–17.0)
MCH: 28.1 pg (ref 26.0–34.0)
MCHC: 33.1 g/dL (ref 30.0–36.0)
MCV: 84.8 fL (ref 80.0–100.0)
Platelets: 213 10*3/uL (ref 150–400)
RBC: 3.95 MIL/uL — ABNORMAL LOW (ref 4.22–5.81)
RDW: 12.3 % (ref 11.5–15.5)
WBC: 8.1 10*3/uL (ref 4.0–10.5)
nRBC: 0 % (ref 0.0–0.2)

## 2019-03-12 LAB — GLUCOSE, CAPILLARY
Glucose-Capillary: 102 mg/dL — ABNORMAL HIGH (ref 70–99)
Glucose-Capillary: 125 mg/dL — ABNORMAL HIGH (ref 70–99)
Glucose-Capillary: 194 mg/dL — ABNORMAL HIGH (ref 70–99)
Glucose-Capillary: 103 mg/dL — ABNORMAL HIGH (ref 70–99)

## 2019-03-12 LAB — MAGNESIUM: Magnesium: 2 mg/dL (ref 1.7–2.4)

## 2019-03-12 NOTE — Progress Notes (Signed)
Oscar Mccarty [id number] 1966-03-26  CARE TEAM:  PCP: Enid Skeens., MD  Outpatient Care Team: Patient Care Team: Enid Skeens., MD as PCP - General (Family Medicine) Ladell Pier, MD as Consulting Physician (Oncology) Jackquline Denmark, MD as Consulting Physician (Gastroenterology) Michael Boston, MD as Consulting Physician (General Surgery)  Inpatient Treatment Team: Treatment Team: Attending Provider: Michael Boston, MD; Registered Nurse: Johna Sheriff, RN; Utilization Review: Sindy Guadeloupe, RN   Problem List:   Principal Problem:   Adenocarcinoma of sigmoid colon [facility]) Active Problems:   History of COVID-19 respiratory pneumonitis June 2020   Cancer of sigmoid colon ([place])   1 Day Post-Op  03/11/2019  Procedure(s): XI ROBOTIC ASSISTED LEFT COLECTOMY, MOBILIZATION OF SPLENIC FLEXURE, BILATERAL TAP BLOCK RIGID PROCTOSCOPY    Assessment  Recovering relatively well so far  [facility] Stay = 1 days)  Plan:  -ERAS protocol -Advance diet -DC fluids with IV backup Foley removed. Follow-up on pathology -VTE prophylaxis- SCDs, etc -mobilize as tolerated to help recovery  20 minutes spent in review, evaluation, examination, counseling, and coordination of care.  More than 50% of that time was spent in counseling.  I updated the patient's status to the patient.  Recommendations were made.  Questions were answered.  He expressed understanding & appreciation.   03/12/2019    Subjective: (Chief complaint)  Mild soreness well controlled.  Walked in hallway.  Tolerating liquids.  Objective:  Vital signs:  Vitals:   03/11/19 1927 03/11/19 2131 03/12/19 0123 03/12/19 0553  BP: 137/84 124/87 117/75 113/67  Pulse: 69 79 65 62  Resp: 16 18 18 18   Temp: 98.5 F (36.9 C) 98.9 F (37.2 C) 99 F (37.2 C) 98.6 F (37 C)  TempSrc: Oral Oral Oral Oral  SpO2: 100% 99% 97% 98%  Weight:      Height:        Last BM Date: 03/10/19  Intake/Output   Yesterday:  08/19 0701 - 08/20 0700 In: 1027 [P.O.:1080; I.V.:3950] Out: 2900 [Urine:2850; Blood:50] This shift:  No intake/output data recorded.  Bowel function:  Flatus: YES  BM:  No  Drain: (No drain)   Physical Exam:  General: Pt awake/alert/oriented x4 in no acute distress Eyes: PERRL, normal EOM.  Sclera clear.  No icterus Neuro: CN II-XII intact w/o focal sensory/motor deficits. Lymph: No head/neck/groin lymphadenopathy Psych:  No delerium/psychosis/paranoia HENT: Normocephalic, Mucus membranes moist.  No thrush Neck: Supple, No tracheal deviation Chest:  No chest wall pain w good excursion CV:  Pulses intact.  Regular rhythm MS: Normal AROM mjr joints.  No obvious deformity  Abdomen: Soft.  Nondistended.  Mildly tender at incisions only.  No evidence of peritonitis.  No incarcerated hernias.  Ext:   No deformity.  No mjr edema.  No cyanosis Skin: No petechiae / purpura  Results:   Cultures: Recent Results (from the past 720 hour(s))  SARS CORONAVIRUS 2 Nasal Swab Aptima Multi Swab     Status: None   Collection Time: 03/09/19  9:31 AM   Specimen: Aptima Multi Swab; Nasal Swab  Result Value Ref Range Status   SARS Coronavirus 2 NEGATIVE NEGATIVE Final    Comment: (NOTE) SARS-CoV-2 target nucleic acids are NOT DETECTED. The SARS-CoV-2 RNA is generally detectable in upper and lower respiratory specimens during the acute phase of infection. Negative results do not preclude SARS-CoV-2 infection, do not rule out co-infections with other pathogens, and should not be used as the sole basis for treatment or other patient management  decisions. Negative results must be combined with clinical observations, patient history, and epidemiological information. The expected result is Negative. Fact Sheet for Patients: SugarRoll.be Fact Sheet for Healthcare Providers: https://www.woods-mathews.com/ This test is not yet approved or  cleared by the [place] FDA and  has been authorized for detection and/or diagnosis of SARS-CoV-2 by FDA under an Emergency Use Authorization (EUA). This EUA will remain  in effect (meaning this test can be used) for the duration of the COVID-19 declaration under Section 56 4(b)(1) of the Act, 21 U.S.C. section 360bbb-3(b)(1), unless the authorization is terminated or revoked sooner. Performed at [facility], [place] [address], [place], [place] [address]     Labs: Results for orders placed or performed during the hospital encounter of 03/11/19 (from the past 48 hour(s))  Glucose, capillary     Status: None   Collection Time: 03/11/19  2:40 PM  Result Value Ref Range   Glucose-Capillary 89 70 - 99 mg/dL  Glucose, capillary     Status: Abnormal   Collection Time: 03/11/19  4:24 PM  Result Value Ref Range   Glucose-Capillary 192 (H) 70 - 99 mg/dL  Glucose, capillary     Status: Abnormal   Collection Time: 03/11/19  5:27 PM  Result Value Ref Range   Glucose-Capillary 157 (H) 70 - 99 mg/dL  Glucose, capillary     Status: Abnormal   Collection Time: 03/11/19 10:08 PM  Result Value Ref Range   Glucose-Capillary 150 (H) 70 - 99 mg/dL  Basic metabolic panel     Status: Abnormal   Collection Time: 03/12/19  4:02 AM  Result Value Ref Range   Sodium 136 135 - 145 mmol/L   Potassium 4.0 3.5 - 5.1 mmol/L   Chloride 105 98 - 111 mmol/L   CO2 22 22 - 32 mmol/L   Glucose, Bld 166 (H) 70 - 99 mg/dL   BUN 9 6 - 20 mg/dL   Creatinine, Ser 0.79 0.61 - 1.24 mg/dL   Calcium 8.1 (L) 8.9 - 10.3 mg/dL   GFR calc non Af Amer >60 >60 mL/min   GFR calc Af Amer >60 >60 mL/min   Anion gap 9 5 - 15    Comment: Performed at [facility], [place] [address] [place], [place] [address]  CBC     Status: Abnormal   Collection Time: 03/12/19  4:02 AM  Result Value Ref Range   WBC 8.1 4.0 - 10.5 K/uL   RBC 3.95 (L) 4.22 - 5.81 MIL/uL   Hemoglobin 11.1 (L) 13.0 - 17.0 g/dL   HCT  33.5 (L) 39.0 - 52.0 %   MCV 84.8 80.0 - 100.0 fL   MCH 28.1 26.0 - 34.0 pg   MCHC 33.1 30.0 - 36.0 g/dL   RDW 12.3 11.5 - 15.5 %   Platelets 213 150 - 400 K/uL   nRBC 0.0 0.0 - 0.2 %    Comment: Performed at [facility], [place] [address] [place], [place] [address]  Magnesium     Status: None   Collection Time: 03/12/19  4:02 AM  Result Value Ref Range   Magnesium 2.0 1.7 - 2.4 mg/dL    Comment: Performed at [facility], [place] [address], [place], [place] [address]    Imaging / Studies: No results found.  Medications / Allergies: per chart  Antibiotics: Anti-infectives (From admission, onward)   Start     Dose/Rate Route Frequency Ordered Stop   03/11/19 2300  cefoTEtan (CEFOTAN) 2 g in sodium chloride 0.9 % 100 mL IVPB     2 g 200 mL/hr over 30 Minutes Intravenous Every 12 hours 03/11/19 1728 03/12/19 0000   03/11/19 1628  clindamycin (CLEOCIN) 900 mg, gentamicin (GARAMYCIN) 240 mg in sodium chloride 0.9 % 1,000 mL for intraperitoneal lavage  Status:  Discontinued       As needed 03/11/19 1628 03/11/19 1721   03/11/19 1400  neomycin (MYCIFRADIN) tablet 1,000 mg  Status:  Discontinued     1,000 mg Oral 3 times per day 03/11/19 1223 03/11/19 1225   03/11/19 1400  metroNIDAZOLE (FLAGYL) tablet 1,000 mg  Status:  Discontinued     1,000 mg Oral 3 times per day 03/11/19 1223 03/11/19 1225   03/11/19 1230  cefoTEtan (CEFOTAN) 2 g in sodium chloride 0.9 % 100 mL IVPB     2 g 200 mL/hr over 30 Minutes Intravenous On call to O.R. 03/11/19 1223 03/11/19 1423   03/11/19 0600  clindamycin (CLEOCIN) 900 mg, gentamicin (GARAMYCIN) 240 mg in sodium chloride 0.9 % 1,000 mL for intraperitoneal lavage  Status:  Discontinued      Irrigation To Surgery 03/10/19 0710 03/11/19 1728        Note: Portions of this report may have been transcribed using voice recognition software. Every effort was made to ensure accuracy; however, inadvertent computerized  transcription errors may be present.   Any transcriptional errors that result from this process are unintentional.     [name], MD, FACS, MASCRS Gastrointestinal and Minimally Invasive Surgery    [address], [place], [place] [address] [phone] Main / Paging [phone] Fax

## 2019-03-13 ENCOUNTER — Encounter (HOSPITAL_COMMUNITY): Payer: Self-pay | Admitting: Surgery

## 2019-03-13 LAB — GLUCOSE, CAPILLARY
Glucose-Capillary: 113 mg/dL — ABNORMAL HIGH (ref 70–99)
Glucose-Capillary: 170 mg/dL — ABNORMAL HIGH (ref 70–99)

## 2019-03-13 NOTE — Anesthesia Postprocedure Evaluation (Signed)
Anesthesia Post Note  Patient: [employer]  Procedure(s) Performed: XI ROBOTIC ASSISTED LEFT COLECTOMY, MOBILIZATION OF SPLENIC FLEXURE, BILATERAL TAP BLOCK (N/A Abdomen) RIGID PROCTOSCOPY (N/A )     Patient location during evaluation: PACU Anesthesia Type: General Level of consciousness: awake and alert Pain management: pain level controlled Vital Signs Assessment: post-procedure vital signs reviewed and stable Respiratory status: spontaneous breathing, nonlabored ventilation, respiratory function stable and patient connected to nasal cannula oxygen Cardiovascular status: blood pressure returned to baseline and stable Postop Assessment: no apparent nausea or vomiting Anesthetic complications: no    Last Vitals:  Vitals:   03/13/19 0528 03/13/19 1339  BP: 123/64 121/69  Pulse: 63 68  Resp: 20 16  Temp: 36.9 C 37.1 C  SpO2: 97% 97%    Last Pain:  Vitals:   03/13/19 1339  TempSrc: Oral  PainSc:                  Arlena Marsan L Leoncio Hansen

## 2019-03-13 NOTE — Progress Notes (Signed)
Pharmacy Brief Note - Alvimopan (Entereg)  The standing order set for alvimopan (Entereg) now includes an automatic order to discontinue the drug after the patient has had a bowel movement. The change was approved by the [place] and the Medical Executive Committee.   This patient has had bowel movements documented by nursing. Therefore, alvimopan has been discontinued. If there are questions, please contact the pharmacy at [phone].   Thank you  Minda Ditto PharmD Pager [phone] 03/13/2019, 1:06 PM

## 2019-03-13 NOTE — Progress Notes (Signed)
Pt alert, oriented, tolerating diet.  D/C instructions given, all questions answered.  Pt d/cd home. 

## 2019-03-13 NOTE — Discharge Instructions (Signed)
SURGERY: POST OP INSTRUCTIONS °(Surgery for small bowel obstruction, colon resection, etc) ° ° °###################################################################### ° °EAT °Gradually transition to a high fiber diet with a fiber supplement over the next few days after discharge ° °WALK °Walk an hour a day.  Control your pain to do that.   ° °CONTROL PAIN °Control pain so that you can walk, sleep, tolerate sneezing/coughing, go up/down stairs. ° °HAVE A BOWEL MOVEMENT DAILY °Keep your bowels regular to avoid problems.  OK to try a laxative to override constipation.  OK to use an antidairrheal to slow down diarrhea.  Call if not better after 2 tries ° °CALL IF YOU HAVE PROBLEMS/CONCERNS °Call if you are still struggling despite following these instructions. °Call if you have concerns not answered by these instructions ° °###################################################################### ° ° °DIET °Follow a light diet the first few days at home.  Start with a bland diet such as soups, liquids, starchy foods, low fat foods, etc.  If you feel full, bloated, or constipated, stay on a ful liquid or pureed/blenderized diet for a few days until you feel better and no longer constipated. °Be sure to drink plenty of fluids every day to avoid getting dehydrated (feeling dizzy, not urinating, etc.). °Gradually add a fiber supplement to your diet over the next week.  Gradually get back to a regular solid diet.  Avoid fast food or heavy meals the first week as you are more likely to get nauseated. °It is expected for your digestive tract to need a few months to get back to normal.  It is common for your bowel movements and stools to be irregular.  You will have occasional bloating and cramping that should eventually fade away.  Until you are eating solid food normally, off all pain medications, and back to regular activities; your bowels will not be normal. °Focus on eating a low-fat, high fiber diet the rest of your life  (See Getting to Good Bowel Health, below). ° °CARE of your INCISION or WOUND °It is good for closed incision and even open wounds to be washed every day.  Shower every day.  Short baths are fine.  Wash the incisions and wounds clean with soap & water.    °If you have a closed incision(s), wash the incision with soap & water every day.  You may leave closed incisions open to air if it is dry.   You may cover the incision with clean gauze & replace it after your daily shower for comfort. °If you have skin tapes (Steristrips) or skin glue (Dermabond) on your incision, leave them in place.  They will fall off on their own like a scab.  You may trim any edges that curl up with clean scissors.  If you have staples, set up an appointment for them to be removed in the office in 10 days after surgery.  °If you have a drain, wash around the skin exit site with soap & water and place a new dressing of gauze or band aid around the skin every day.  Keep the drain site clean & dry.    °If you have an open wound with packing, see wound care instructions.  In general, it is encouraged that you remove your dressing and packing, shower with soap & water, and replace your dressing once a day.  Pack the wound with clean gauze moistened with normal (0.9%) saline to keep the wound moist & uninfected.  Pressure on the dressing for 30 minutes will stop most wound   bleeding.  Eventually your body will heal & pull the open wound closed over the next few months.  °Raw open wounds will occasionally bleed or secrete yellow drainage until it heals closed.  Drain sites will drain a little until the drain is removed.  Even closed incisions can have mild bleeding or drainage the first few days until the skin edges scab over & seal.   °If you have an open wound with a wound vac, see wound vac care instructions. ° ° ° ° °ACTIVITIES as tolerated °Start light daily activities --- self-care, walking, climbing stairs-- beginning the day after surgery.   Gradually increase activities as tolerated.  Control your pain to be active.  Stop when you are tired.  Ideally, walk several times a day, eventually an hour a day.   °Most people are back to most day-to-day activities in a few weeks.  It takes 4-8 weeks to get back to unrestricted, intense activity. °If you can walk 30 minutes without difficulty, it is safe to try more intense activity such as jogging, treadmill, bicycling, low-impact aerobics, swimming, etc. °Save the most intensive and strenuous activity for last (Usually 4-8 weeks after surgery) such as sit-ups, heavy lifting, contact sports, etc.  Refrain from any intense heavy lifting or straining until you are off narcotics for pain control.  You will have off days, but things should improve week-by-week. °DO NOT PUSH THROUGH PAIN.  Let pain be your guide: If it hurts to do something, don't do it.  Pain is your body warning you to avoid that activity for another week until the pain goes down. °You may drive when you are no longer taking narcotic prescription pain medication, you can comfortably wear a seatbelt, and you can safely make sudden turns/stops to protect yourself without hesitating due to pain. °You may have sexual intercourse when it is comfortable. If it hurts to do something, stop. ° °MEDICATIONS °Take your usually prescribed home medications unless otherwise directed.   °Blood thinners:  °Usually you can restart any strong blood thinners after the second postoperative day.  It is OK to take aspirin right away.    ° If you are on strong blood thinners (warfarin/Coumadin, Plavix, Xerelto, Eliquis, Pradaxa, etc), discuss with your surgeon, medicine PCP, and/or cardiologist for instructions on when to restart the blood thinner & if blood monitoring is needed (PT/INR blood check, etc).   ° ° °PAIN CONTROL °Pain after surgery or related to activity is often due to strain/injury to muscle, tendon, nerves and/or incisions.  This pain is usually  short-term and will improve in a few months.  °To help speed the process of healing and to get back to regular activity more quickly, DO THE FOLLOWING THINGS TOGETHER: °1. Increase activity gradually.  DO NOT PUSH THROUGH PAIN °2. Use Ice and/or Heat °3. Try Gentle Massage and/or Stretching °4. Take over the counter pain medication °5. Take Narcotic prescription pain medication for more severe pain ° °Good pain control = faster recovery.  It is better to take more medicine to be more active than to stay in bed all day to avoid medications. °1.  Increase activity gradually °Avoid heavy lifting at first, then increase to lifting as tolerated over the next 6 weeks. °Do not “push through” the pain.  Listen to your body and avoid positions and maneuvers than reproduce the pain.  Wait a few days before trying something more intense °Walking an hour a day is encouraged to help your body recover faster   and more safely.  Start slowly and stop when getting sore.  If you can walk 30 minutes without stopping or pain, you can try more intense activity (running, jogging, aerobics, cycling, swimming, treadmill, sex, sports, weightlifting, etc.) °Remember: If it hurts to do it, then don’t do it! °2. Use Ice and/or Heat °You will have swelling and bruising around the incisions.  This will take several weeks to resolve. °Ice packs or heating pads (6-8 times a day, 30-60 minutes at a time) will help sooth soreness & bruising. °Some people prefer to use ice alone, heat alone, or alternate between ice & heat.  Experiment and see what works best for you.  Consider trying ice for the first few days to help decrease swelling and bruising; then, switch to heat to help relax sore spots and speed recovery. °Shower every day.  Short baths are fine.  It feels good!  Keep the incisions and wounds clean with soap & water.   °3. Try Gentle Massage and/or Stretching °Massage at the area of pain many times a day °Stop if you feel pain - do not  overdo it °4. Take over the counter pain medication °This helps the muscle and nerve tissues become less irritable and calm down faster °Choose ONE of the following over-the-counter anti-inflammatory medications: °Acetaminophen 500mg tabs (Tylenol) 1-2 pills with every meal and just before bedtime (avoid if you have liver problems or if you have acetaminophen in you narcotic prescription) °Naproxen 220mg tabs (ex. Aleve, Naprosyn) 1-2 pills twice a day (avoid if you have kidney, stomach, IBD, or bleeding problems) °Ibuprofen 200mg tabs (ex. Advil, Motrin) 3-4 pills with every meal and just before bedtime (avoid if you have kidney, stomach, IBD, or bleeding problems) °Take with food/snack several times a day as directed for at least 2 weeks to help keep pain / soreness down & more manageable. °5. Take Narcotic prescription pain medication for more severe pain °A prescription for strong pain control is often given to you upon discharge (for example: oxycodone/Percocet, hydrocodone/Norco/Vicodin, or tramadol/Ultram) °Take your pain medication as prescribed. °Be mindful that most narcotic prescriptions contain Tylenol (acetaminophen) as well - avoid taking too much Tylenol. °If you are having problems/concerns with the prescription medicine (does not control pain, nausea, vomiting, rash, itching, etc.), please call us [phone] to see if we need to switch you to a different pain medicine that will work better for you and/or control your side effects better. °If you need a refill on your pain medication, you must call the office before 4 pm and on weekdays only.  By federal law, prescriptions for narcotics cannot be called into a pharmacy.  They must be filled out on paper & picked up from our office by the patient or authorized caretaker.  Prescriptions cannot be filled after 4 pm nor on weekends.   ° °WHEN TO CALL US [phone] °Severe uncontrolled or worsening pain  °Fever over 101 F (38.5 C) °Concerns with  the incision: Worsening pain, redness, rash/hives, swelling, bleeding, or drainage °Reactions / problems with new medications (itching, rash, hives, nausea, etc.) °Nausea and/or vomiting °Difficulty urinating °Difficulty breathing °Worsening fatigue, dizziness, lightheadedness, blurred vision °Other concerns °If you are not getting better after two weeks or are noticing you are getting worse, contact our office [phone] for further advice.  We may need to adjust your medications, re-evaluate you in the office, send you to the emergency room, or see what other things we can do to help. °The   clinic staff is available to answer your questions during regular business hours (8:30am-5pm).  Please don’t hesitate to call and ask to speak to one of our nurses for clinical concerns.    °A surgeon from [facility] is always on call at the hospitals 24 hours/day °If you have a medical emergency, go to the nearest emergency room or call 911. ° °FOLLOW UP in our office °One the day of your discharge from the hospital (or the next business weekday), please call [facility] to set up or confirm an appointment to see your surgeon in the office for a follow-up appointment.  Usually it is 2-3 weeks after your surgery.   °If you have skin staples at your incision(s), let the office know so we can set up a time in the office for the nurse to remove them (usually around 10 days after surgery). °Make sure that you call for appointments the day of discharge (or the next business weekday) from the hospital to ensure a convenient appointment time. °IF YOU HAVE DISABILITY OR FAMILY LEAVE FORMS, BRING THEM TO THE OFFICE FOR PROCESSING.  DO NOT GIVE THEM TO YOUR DOCTOR. ° °[facility] °[address], [address], [place],   [address] ? °[phone] - Main °[phone] - Toll Free,  [phone] - Fax °[email or web address] ° °GETTING TO GOOD BOWEL HEALTH. °It is  expected for your digestive tract to need a few months to get back to normal.  It is common for your bowel movements and stools to be irregular.  You will have occasional bloating and cramping that should eventually fade away.  Until you are eating solid food normally, off all pain medications, and back to regular activities; your bowels will not be normal.   °Avoiding constipation °The goal: ONE SOFT BOWEL MOVEMENT A DAY!    °Drink plenty of fluids.  Choose water first. °TAKE A FIBER SUPPLEMENT EVERY DAY THE REST OF YOUR LIFE °During your first week back home, gradually add back a fiber supplement every day °Experiment which form you can tolerate.   There are many forms such as powders, tablets, wafers, gummies, etc °Psyllium bran (Metamucil), methylcellulose (Citrucel), Miralax or Glycolax, Benefiber, Flax Seed.  °Adjust the dose week-by-week (1/2 dose/day to 6 doses a day) until you are moving your bowels 1-2 times a day.  Cut back the dose or try a different fiber product if it is giving you problems such as diarrhea or bloating. °Sometimes a laxative is needed to help jump-start bowels if constipated until the fiber supplement can help regulate your bowels.  If you are tolerating eating & you are farting, it is okay to try a gentle laxative such as double dose MiraLax, prune juice, or Milk of Magnesia.  Avoid using laxatives too often. °Stool softeners can sometimes help counteract the constipating effects of narcotic pain medicines.  It can also cause diarrhea, so avoid using for too long. °If you are still constipated despite taking fiber daily, eating solids, and a few doses of laxatives, call our office. °Controlling diarrhea °Try drinking liquids and eating bland foods for a few days to avoid stressing your intestines further. °Avoid dairy products (especially milk & ice cream) for a short time.  The intestines often can lose the ability to digest lactose when stressed. °Avoid foods that cause gassiness or  bloating.  Typical foods include beans and other legumes, cabbage, broccoli, and dairy foods.  Avoid greasy, spicy, fast foods.  Every person has   some sensitivity to other foods, so listen to your body and avoid those foods that trigger problems for you. Probiotics (such as active yogurt, Align, etc) may help repopulate the intestines and colon with normal bacteria and calm down a sensitive digestive tract Adding a fiber supplement gradually can help thicken stools by absorbing excess fluid and retrain the intestines to act more normally.  Slowly increase the dose over a few weeks.  Too much fiber too soon can backfire and cause cramping & bloating. It is okay to try and slow down diarrhea with a few doses of antidiarrheal medicines.   Bismuth subsalicylate (ex. Kayopectate, Pepto Bismol) for a few doses can help control diarrhea.  Avoid if pregnant.   Loperamide (Imodium) can slow down diarrhea.  Start with one tablet (2mg ) first.  Avoid if you are having fevers or severe pain.  ILEOSTOMY PATIENTS WILL HAVE CHRONIC DIARRHEA since their colon is not in use.    Drink plenty of liquids.  You will need to drink even more glasses of water/liquid a day to avoid getting dehydrated. Record output from your ileostomy.  Expect to empty the bag every 3-4 hours at first.  Most people with a permanent ileostomy empty their bag 4-6 times at the least.   Use antidiarrheal medicine (especially Imodium) several times a day to avoid getting dehydrated.  Start with a dose at bedtime & breakfast.  Adjust up or down as needed.  Increase antidiarrheal medications as directed to avoid emptying the bag more than 8 times a day (every 3 hours). Work with your wound ostomy nurse to learn care for your ostomy.  See ostomy care instructions. TROUBLESHOOTING IRREGULAR BOWELS 1) Start with a soft & bland diet. No spicy, greasy, or fried foods.  2) Avoid gluten/wheat or dairy products from diet to see if symptoms improve. 3) Miralax  17gm or flax seed mixed in [place]. water or juice-daily. May use 2-4 times a day as needed. 4) Gas-X, Phazyme, etc. as needed for gas & bloating.  5) Prilosec (omeprazole) over-the-counter as needed 6)  Consider probiotics (Align, Activa, etc) to help calm the bowels down  Call your doctor if you are getting worse or not getting better.  Sometimes further testing (cultures, endoscopy, X-ray studies, CT scans, bloodwork, etc.) may be needed to help diagnose and treat the cause of the diarrhea. [facility], [place], [place], [place], [place]  [address] [phone] - Main.    [phone]  - Toll Free.   [phone] - Fax [email or web address]    Cncer colorrectal Colorectal Cancer  El cncer colorrectal es un crecimiento anormal de clulas y tejido (tumor) en el colon o el recto, que son partes del intestino grueso. El cncer se puede propagar Chartered loss adjuster) a Airline pilot del cuerpo. Cules son las causas? En la [employer], el cncer colorrectal comienza con crecimientos anormales llamados plipos en la pared interna del colon o el recto. En otras ocasiones, los cambios [employer] genes (mutaciones genticas) pueden causar que las clulas formen cncer. Qu incrementa el riesgo? Es ms probable que usted sufra esta afeccin si:  Es mayor de [id number].  Tiene mltiples plipos en el colon o el recto.  Tiene diabetes.  Es afroamericano.  Tiene antecedentes familiares de sndrome de [place].  Ya ha tenido cncer.  Tiene determinadas afecciones hereditarias, por ejemplo: ? Poliposis adenomatosa familiar. ? Sndrome de [place]. ? Sndrome de Peutz-Jeghers.  Consume una dieta que es alta en  grasa (especialmente grasa animal) y baja en fibra, frutas y verduras.  Tiene un estilo de vida inactivo (sedentario).  Tiene una enfermedad inflamatoria del intestino o enfermedad de Crohn.  Fuma.  Bebe alcohol en exceso. Cules  son los signos o sntomas? El cncer colorrectal, generalmente, no causa sntomas. A medida que el cncer avanza, los sntomas pueden incluir los siguientes:  Cambios en los hbitos intestinales.  Sentir que el intestino no se vaca completamente despus de defecar.  Heces ms estrechas que lo habitual.  Sangre en las heces.  Diarrea.  Estreimiento.  Anemia.  Molestias, dolor, distensin, sensacin de estar lleno o clicos en el abdomen.  Dolor frecuente por meteorismo.  Prdida de peso sin causa aparente.  Fatiga constante.  Nuseas y vmitos. Cmo se diagnostica? Esta afeccin se puede diagnosticar con lo siguiente:  Los antecedentes mdicos.  Un examen fsico.  Estudios. Estos pueden incluir: ? Examen rectal digital. ? Un anlisis de heces llamado anlisis de sangre oculta en heces. ? Anlisis de [place]. ? Un estudio en el cual se toma Truddie Coco de tejido del colon o del recto y se examina con un microscopio (biopsia). ? Pruebas de diagnstico por imgenes, por ejemplo:  Radiografas.  Enema de bario.  Exploraciones por tomografa computarizada (TC).  Resonancias magnticas (RM).  Sigmoidoscopia. Este estudio se lleva a cabo para observar el interior del recto.  Colonoscopia. Este estudio se lleva a cabo para observar el interior del colon. [employer], se pueden extirpar los plipos pequeos o Optometrist biopsias.  Ecografa endorrectal. Este estudio verifica cunto ha crecido un tumor en el recto y si el cncer se ha diseminado a los ganglios linfticos o a otros tejidos cercanos. El mdico puede solicitar estudios adicionales para determinar si el cncer se ha diseminado a otras partes del cuerpo (en qu estadio se encuentra). Los estadios del cncer incluyen los siguientes:  Estadio 0. En este estadio, el cncer se encuentra solo en el revestimiento ms interno del colon o del recto.  Estadio I. En este estadio, el cncer se ha diseminado a  la pared interna del colon o del recto.  Estadio II. En este estadio, el cncer se ha extendido ms profundamente a la pared del colon o del recto, o a travs de la pared. Puede haber invadido el tejido cercano.  Estadio III. En este estadio, el cncer se ha extendido a los ganglios linfticos cercanos.  Estadio IV. En este estadio, el cncer se ha extendido a otras partes del cuerpo, como el hgado o los pulmones. Cmo se trata? El tratamiento de esta afeccin depende del tipo y del estadio del cncer. El tratamiento puede incluir lo siguiente:  Clementeen Hoof. En los primeros estadios del cncer, se puede realizar una ciruga para extirpar los plipos o pequeos tumores del colon. En estadios posteriores, se puede realizar [place] para extirpar parte del colon.  Quimioterapia. Este tratamiento utiliza medicamentos para destruir clulas cancerosas.  Terapia dirigida. El tratamiento est dirigido a mutaciones de genes especficos o protenas que el cncer exterioriza para destruir las clulas del tumor.  Radioterapia. En este tratamiento, se utiliza radiacin para destruir las clulas cancerosas o reducir los tumores.  Ablacin por radiofrecuencia. En [employer], se utilizan ondas de radio para [employer] tumores que puedan haberse propagado a otras reas del cuerpo, como el hgado. Siga estas indicaciones en su casa:  Tome los medicamentos de venta libre y los recetados solamente como se lo haya indicado el mdico.  Coma  alimentos saludables de manera regular. Algunos tratamientos podran afectar su apetito. Si tiene problemas para comer o de apetito, solicite una cita con un especialista en nutricin o alimentacin (nutricionista).  Considere la posibilidad de Chief Financial Officer en un grupo de apoyo. Esto puede ayudarlo a obtener informacin sobre su diagnstico y a Animal nutritionist de Photographer.  Si lo internan en el hospital, informe a su equipo de Museum/gallery exhibitions officer.  Concurra a todas las visitas de control como se lo haya indicado el mdico. Esto es importante. Cmo se evita?  El Surveyor, minerals se puede prevenir con pruebas de deteccin que descubren plipos para que puedan ser extirpados antes de que se conviertan en cncer.  Todos los adultos a Proofreader de los 61 aos y [employer] 42 aos deben hacerse pruebas de Programme researcher, broadcasting/film/video de Surveyor, minerals. El mdico puede recomendarle las pruebas de deteccin a partir de los 69 aos. [employer] tienen un mayor riesgo deben comenzar con las pruebas de deteccin a una edad ms temprana. Dnde buscar ms informacin  Sociedad Museum/gallery conservator ([place]): https://www.cancer.[place] ([place]): https://www.cancer.gov Comunquese con un mdico si:  La diarrea o el estreimiento persisten.  Observa sangre en las heces.  Sus hbitos intestinales han [place].  Siente ms dolor en el abdomen.  Nota que se siente ms cansado o dbil.  Pierde peso. Solicite ayuda de inmediato si:  Observa un aumento del sangrado en el recto.  Tiene sntomas en el abdomen (abdominales) que son graves o no IT consultant. Resumen  El cncer colorrectal es un crecimiento anormal de clulas y tejido (tumor) en el colon o el recto.  Los factores de riesgo comunes de esta afeccin incluyen tener un familiar que haya sufrido cncer de colon, tener una edad avanzada, sufrir una enfermedad inflamatoria del intestino y ser afroamericano.  Esta afeccin se puede diagnosticar mediante estudios, como una colonoscopa y Ardelia Mems biopsia.  El tratamiento depende del tipo y el estadio del cncer. Normalmente, el tratamiento incluye una ciruga para extraer el tumor junto con quimioterapia o terapia dirigida.  Concurra a todas las visitas de control como se lo haya indicado el mdico. Esto es importante. Esta informacin no tiene Marine scientist  el consejo del mdico. Asegrese de hacerle al mdico cualquier pregunta que tenga. Document Released: 10/01/2011 Document Revised: 10/19/2017 Document Reviewed: 10/29/2016 Elsevier Patient Education  [place] colorrectal es un crecimiento anormal de clulas y tejido (tumor) en el colon o el recto, que son partes del intestino grueso. El cncer se puede propagar Chartered loss adjuster) a Airline pilot del cuerpo. Cules son las causas? En la [employer], el cncer colorrectal comienza con crecimientos anormales llamados plipos en la pared interna del colon o el recto. En otras ocasiones, los cambios Walt Disney genes (mutaciones genticas) pueden causar que las clulas formen cncer. Qu incrementa el riesgo? Es ms probable que usted sufra esta afeccin si:  Es mayor de [id number].  Tiene mltiples plipos en el colon o el recto.  Tiene diabetes.  Es afroamericano.  Tiene antecedentes familiares de sndrome de [place].  Ya ha tenido cncer.  Tiene determinadas afecciones hereditarias, por ejemplo: ? Poliposis adenomatosa familiar. ? Sndrome de [place]. ? Sndrome de Peutz-Jeghers.  Consume una dieta que es alta en grasa (especialmente grasa animal) y baja en fibra, frutas y verduras.  Tiene un estilo de vida inactivo (  sedentario).  Tiene una enfermedad inflamatoria del intestino o enfermedad de Crohn.  Fuma.  Bebe alcohol en exceso. Cules son los signos o sntomas? El cncer colorrectal, generalmente, no causa sntomas. A medida que el cncer avanza, los sntomas pueden incluir los siguientes:  Cambios en los hbitos intestinales.  Sentir que el intestino no se vaca completamente despus de defecar.  Heces ms estrechas que lo habitual.  Sangre en las heces.  Diarrea.  Estreimiento.  Anemia.  Molestias, dolor, distensin, sensacin de estar lleno o clicos en el abdomen.  Dolor frecuente por  meteorismo.  Prdida de peso sin causa aparente.  Fatiga constante.  Nuseas y vmitos. Cmo se diagnostica? Esta afeccin se puede diagnosticar con lo siguiente:  Los antecedentes mdicos.  Un examen fsico.  Estudios. Estos pueden incluir: ? Examen rectal digital. ? Un anlisis de heces llamado anlisis de sangre oculta en heces. ? Anlisis de [place]. ? Un estudio en el cual se toma Truddie Coco de tejido del colon o del recto y se examina con un microscopio (biopsia). ? Pruebas de diagnstico por imgenes, por ejemplo:  Radiografas.  Enema de bario.  Exploraciones por tomografa computarizada (TC).  Resonancias magnticas (RM).  Sigmoidoscopia. Este estudio se lleva a cabo para observar el interior del recto.  Colonoscopia. Este estudio se lleva a cabo para observar el interior del colon. [employer], se pueden extirpar los plipos pequeos o Optometrist biopsias.  Ecografa endorrectal. Este estudio verifica cunto ha crecido un tumor en el recto y si el cncer se ha diseminado a los ganglios linfticos o a otros tejidos cercanos. El mdico puede solicitar estudios adicionales para determinar si el cncer se ha diseminado a otras partes del cuerpo (en qu estadio se encuentra). Los estadios del cncer incluyen los siguientes:  Estadio 0. En este estadio, el cncer se encuentra solo en el revestimiento ms interno del colon o del recto.  Estadio I. En este estadio, el cncer se ha diseminado a la pared interna del colon o del recto.  Estadio II. En este estadio, el cncer se ha extendido ms profundamente a la pared del colon o del recto, o a travs de la pared. Puede haber invadido el tejido cercano.  Estadio III. En este estadio, el cncer se ha extendido a los ganglios linfticos cercanos.  Estadio IV. En este estadio, el cncer se ha extendido a otras partes del cuerpo, como el hgado o los pulmones. Cmo se trata? El tratamiento de esta afeccin depende  del tipo y del estadio del cncer. El tratamiento puede incluir lo siguiente:  Clementeen Hoof. En los primeros estadios del cncer, se puede realizar una ciruga para extirpar los plipos o pequeos tumores del colon. En estadios posteriores, se puede realizar [place] para extirpar parte del colon.  Quimioterapia. Este tratamiento utiliza medicamentos para destruir clulas cancerosas.  Terapia dirigida. El tratamiento est dirigido a mutaciones de genes especficos o protenas que el cncer exterioriza para destruir las clulas del tumor.  Radioterapia. En este tratamiento, se utiliza radiacin para destruir las clulas cancerosas o reducir los tumores.  Ablacin por radiofrecuencia. En [employer], se utilizan ondas de radio para [employer] tumores que puedan haberse propagado a otras reas del cuerpo, como el hgado. Siga estas indicaciones en su casa:  Tome los medicamentos de venta libre y los recetados solamente como se lo haya indicado el mdico.  Coma alimentos saludables de [place] regular. Algunos tratamientos podran afectar su apetito. Si tiene problemas para comer o de  apetito, solicite una cita con un especialista en nutricin o alimentacin (nutricionista).  Considere la posibilidad de Chief Financial Officer en un grupo de apoyo. Esto puede ayudarlo a obtener informacin sobre su diagnstico y a Animal nutritionist de Photographer.  Si lo internan en el hospital, informe a su equipo de Freight forwarder.  Concurra a todas las visitas de control como se lo haya indicado el mdico. Esto es importante. Cmo se evita?  El Surveyor, minerals se puede prevenir con pruebas de deteccin que descubren plipos para que puedan ser extirpados antes de que se conviertan en cncer.  Todos los adultos a Proofreader de los 93 aos y [employer] 58 aos deben hacerse pruebas de Programme researcher, broadcasting/film/video de Surveyor, minerals. El mdico puede recomendarle las pruebas de deteccin a partir de los 70 aos.  Las [employer] tienen un mayor riesgo deben comenzar con las pruebas de deteccin a una edad ms temprana. Dnde buscar ms informacin  [employer] Museum/gallery conservator ([place]): https://www.cancer.[place] ([place]): https://www.cancer.gov Comunquese con un mdico si:  La diarrea o el estreimiento persisten.  Observa sangre en las heces.  Sus hbitos intestinales han [place].  Siente ms dolor en el abdomen.  Nota que se siente ms cansado o dbil.  Pierde peso. Solicite ayuda de inmediato si:  Observa un aumento del sangrado en el recto.  Tiene sntomas en el abdomen (abdominales) que son graves o no IT consultant. Resumen  El cncer colorrectal es un crecimiento anormal de clulas y tejido (tumor) en el colon o el recto.  Los factores de riesgo comunes de esta afeccin incluyen tener un familiar que haya sufrido cncer de colon, tener una edad avanzada, sufrir una enfermedad inflamatoria del intestino y ser afroamericano.  Esta afeccin se puede diagnosticar mediante estudios, como una colonoscopa y Ardelia Mems biopsia.  El tratamiento depende del tipo y el estadio del cncer. Normalmente, el tratamiento incluye una ciruga para extraer el tumor junto con quimioterapia o terapia dirigida.  Concurra a todas las visitas de control como se lo haya indicado el mdico. Esto es importante. Esta informacin no tiene Marine scientist el consejo del mdico. Asegrese de hacerle al mdico cualquier pregunta que tenga. Document Released: 10/01/2011 Document Revised: 10/19/2017 Document Reviewed: 10/29/2016 Elsevier Patient Education  2020 [employer].

## 2019-03-13 NOTE — Discharge Summary (Signed)
Physician Discharge Summary    Patient ID: Oscar Mccarty MRN: [id number] DOB/AGE: 08/23/1965  53 y.o.  Patient Care Team: Enid Skeens., MD as PCP - General (Family Medicine) Ladell Pier, MD as Consulting Physician (Oncology) Jackquline Denmark, MD as Consulting Physician (Gastroenterology) Michael Boston, MD as Consulting Physician (General Surgery)  Admit date: 03/11/2019  Discharge date: 03/13/2019  Hospital Stay = 2 days    Discharge Diagnoses:  Principal Problem:   Adenocarcinoma of sigmoid colon [facility]) Active Problems:   History of COVID-19 respiratory pneumonitis June 2020   Cancer of sigmoid colon ([place])   2 Days Post-Op  03/11/2019  POST-OPERATIVE DIAGNOSIS:  DISTAL DESCENDING COLON CANCER  PROCEDURE:   XI ROBOTIC ASSISTED PARTIAL COLECTOMY (DESCENDING & SIGMOID COLON) MOBILIZATION OF SPLENIC FLEXURE BILATERAL TAP BLOCK RIGID PROCTOSCOPY  SURGEON:  [name], MD   Consults: None  Hospital Course:   The patient underwent the surgery above.  Postoperatively, the patient gradually mobilized and advanced to a solid diet.  Pain and other symptoms were treated aggressively.    By the time of discharge, the patient was walking well the hallways, eating food, having flatus.  Pain was well-controlled on an oral medications.  Based on meeting discharge criteria and continuing to recover, I felt it was safe for the patient to be discharged from the hospital to further recover with close followup. Postoperative recommendations were discussed in detail.  They are written as well.  Discharged Condition: good  Discharge Exam: Blood pressure 123/64, pulse 63, temperature 98.4 F (36.9 C), temperature source Oral, resp. rate 20, height 5\' 7"  (1.702 m), weight 71.6 kg, SpO2 97 %.  General: Pt awake/alert/oriented x4 in No acute distress Eyes: PERRL, normal EOM.  Sclera clear.  No icterus Neuro: CN II-XII intact w/o focal sensory/motor deficits. Lymph: No  head/neck/groin lymphadenopathy Psych:  No delerium/psychosis/paranoia HENT: Normocephalic, Mucus membranes moist.  No thrush Neck: Supple, No tracheal deviation Chest: No chest wall pain w good excursion CV:  Pulses intact.  Regular rhythm MS: Normal AROM mjr joints.  No obvious deformity Abdomen: Soft.  Nondistended.  Nontender.  Dressings c/d/i.  No evidence of peritonitis.  No incarcerated hernias. Ext:  SCDs BLE.  No mjr edema.  No cyanosis Skin: No petechiae / purpura   Disposition:   Follow-up Information    Michael Boston, MD. Schedule an appointment as soon as possible for a visit in 3 weeks.   Specialty: General Surgery Contact information: [place] [address] [phone]           Discharge disposition: 01-Home or Self Care       Discharge Instructions    Call MD for:   Complete by: As directed    FEVER > 101.5 F  (temperatures < 101.5 F are not significant)   Call MD for:   Complete by: As directed    FEVER > 101.5 F  (temperatures < 101.5 F are not significant)   Call MD for:  extreme fatigue   Complete by: As directed    Call MD for:  extreme fatigue   Complete by: As directed    Call MD for:  persistant dizziness or light-headedness   Complete by: As directed    Call MD for:  persistant dizziness or light-headedness   Complete by: As directed    Call MD for:  persistant nausea and vomiting   Complete by: As directed    Call MD for:  persistant nausea and  vomiting   Complete by: As directed    Call MD for:  redness, tenderness, or signs of infection (pain, swelling, redness, odor or green/yellow discharge around incision site)   Complete by: As directed    Call MD for:  redness, tenderness, or signs of infection (pain, swelling, redness, odor or green/yellow discharge around incision site)   Complete by: As directed    Call MD for:  severe uncontrolled pain   Complete by: As directed    Call MD for:  severe  uncontrolled pain   Complete by: As directed    Diet - low sodium heart healthy   Complete by: As directed    Start with a bland diet such as soups, liquids, starchy foods, low fat foods, etc. the first few days at home. Gradually advance to a solid, low-fat, high fiber diet by the end of the first week at home.   Add a fiber supplement to your diet (Metamucil, etc) If you feel full, bloated, or constipated, stay on a full liquid or pureed/blenderized diet for a few days until you feel better and are no longer constipated.   Discharge instructions   Complete by: As directed    See Discharge Instructions If you are not getting better after two weeks or are noticing you are getting worse, contact our office [phone] for further advice.  We may need to adjust your medications, re-evaluate you in the office, send you to the emergency room, or see what other things we can do to help. The clinic staff is available to answer your questions during regular business hours (8:30am-5pm).  Please don't hesitate to call and ask to speak to one of our nurses for clinical concerns.    A surgeon from [facility] is always on call at the hospitals 24 hours/day If you have a medical emergency, go to the nearest emergency room or call 911.   Discharge instructions   Complete by: As directed    See Discharge Instructions If you are not getting better after two weeks or are noticing you are getting worse, contact our office [phone] for further advice.  We may need to adjust your medications, re-evaluate you in the office, send you to the emergency room, or see what other things we can do to help. The clinic staff is available to answer your questions during regular business hours (8:30am-5pm).  Please don't hesitate to call and ask to speak to one of our nurses for clinical concerns.    A surgeon from [facility] is always on call at the hospitals 24 hours/day If you have a  medical emergency, go to the nearest emergency room or call 911.   Discharge wound care:   Complete by: As directed    It is good for closed incisions and even open wounds to be washed every day.  Shower every day.  Short baths are fine.  Wash the incisions and wounds clean with soap & water.    You may leave closed incisions open to air if it is dry.   You may cover the incision with clean gauze & replace it after your daily shower for comfort.   Discharge wound care:   Complete by: As directed    It is good for closed incisions and even open wounds to be washed every day.  Shower every day.  Short baths are fine.  Wash the incisions and wounds clean with soap & water.    You may  leave closed incisions open to air if it is dry.   You may cover the incision with clean gauze & replace it after your daily shower for comfort.   Driving Restrictions   Complete by: As directed    You may drive when: - you are no longer taking narcotic prescription pain medication - you can comfortably wear a seatbelt - you can safely make sudden turns/stops without pain.   Driving Restrictions   Complete by: As directed    You may drive when: - you are no longer taking narcotic prescription pain medication - you can comfortably wear a seatbelt - you can safely make sudden turns/stops without pain.   Increase activity slowly   Complete by: As directed    Start light daily activities --- self-care, walking, climbing stairs- beginning the day after surgery.  Gradually increase activities as tolerated.  Control your pain to be active.  Stop when you are tired.  Ideally, walk several times a day, eventually an hour a day.   Most people are back to most day-to-day activities in a few weeks.  It takes 4-6 weeks to get back to unrestricted, intense activity. If you can walk 30 minutes without difficulty, it is safe to try more intense activity such as jogging, treadmill, bicycling, low-impact aerobics, swimming, etc.  Save the most intensive and strenuous activity for last (Usually 4-8 weeks after surgery) such as sit-ups, heavy lifting, contact sports, etc.  Refrain from any intense heavy lifting or straining until you are off narcotics for pain control.  You will have off days, but things should improve week-by-week. DO NOT PUSH THROUGH PAIN.  Let pain be your guide: If it hurts to do something, don't do it.   Increase activity slowly   Complete by: As directed    Start light daily activities --- self-care, walking, climbing stairs- beginning the day after surgery.  Gradually increase activities as tolerated.  Control your pain to be active.  Stop when you are tired.  Ideally, walk several times a day, eventually an hour a day.   Most people are back to most day-to-day activities in a few weeks.  It takes 4-6 weeks to get back to unrestricted, intense activity. If you can walk 30 minutes without difficulty, it is safe to try more intense activity such as jogging, treadmill, bicycling, low-impact aerobics, swimming, etc. Save the most intensive and strenuous activity for last (Usually 4-8 weeks after surgery) such as sit-ups, heavy lifting, contact sports, etc.  Refrain from any intense heavy lifting or straining until you are off narcotics for pain control.  You will have off days, but things should improve week-by-week. DO NOT PUSH THROUGH PAIN.  Let pain be your guide: If it hurts to do something, don't do it.   Lifting restrictions   Complete by: As directed    If you can walk 30 minutes without difficulty, it is safe to try more intense activity such as jogging, treadmill, bicycling, low-impact aerobics, swimming, etc. Save the most intensive and strenuous activity for last (Usually 4-8 weeks after surgery) such as sit-ups, heavy lifting, contact sports, etc.   Refrain from any intense heavy lifting or straining until you are off narcotics for pain control.  You will have off days, but things should improve  week-by-week. DO NOT PUSH THROUGH PAIN.  Let pain be your guide: If it hurts to do something, don't do it.  Pain is your body warning you to avoid that activity for another week  until the pain goes down.   Lifting restrictions   Complete by: As directed    If you can walk 30 minutes without difficulty, it is safe to try more intense activity such as jogging, treadmill, bicycling, low-impact aerobics, swimming, etc. Save the most intensive and strenuous activity for last (Usually 4-8 weeks after surgery) such as sit-ups, heavy lifting, contact sports, etc.   Refrain from any intense heavy lifting or straining until you are off narcotics for pain control.  You will have off days, but things should improve week-by-week. DO NOT PUSH THROUGH PAIN.  Let pain be your guide: If it hurts to do something, don't do it.  Pain is your body warning you to avoid that activity for another week until the pain goes down.   May shower / Bathe   Complete by: As directed    May shower / Bathe   Complete by: As directed    May walk up steps   Complete by: As directed    May walk up steps   Complete by: As directed    Remove dressing in 72 hours   Complete by: As directed    Make sure all dressings are removed by the third day after surgery.  Leave incisions open to air.  OK to cover incisions with gauze or bandages as desired   Remove dressing in 72 hours   Complete by: As directed    Make sure all dressings are removed by the third day after surgery = 8/22 SATURDAY.  Leave incisions open to air.  OK to cover incisions with gauze or bandages as desired   Sexual Activity Restrictions   Complete by: As directed    You may have sexual intercourse when it is comfortable. If it hurts to do something, stop.   Sexual Activity Restrictions   Complete by: As directed    You may have sexual intercourse when it is comfortable. If it hurts to do something, stop.      Allergies as of 03/13/2019   No Known Allergies      Medication List    TAKE these medications   ibuprofen 200 MG tablet Commonly known as: ADVIL Take 400 mg by mouth every 6 (six) hours as needed for headache or moderate pain.   metFORMIN 500 MG tablet Commonly known as: Glucophage Take 1 tablet (500 mg total) by mouth 2 (two) times daily with a meal.   SUMAtriptan 50 MG tablet Commonly known as: IMITREX Take 50 mg by mouth every 2 (two) hours as needed for migraine or headache. May repeat in 2 hours if headache persists or recurs.   traMADol 50 MG tablet Commonly known as: ULTRAM Take 1-2 tablets (50-100 mg total) by mouth every 6 (six) hours as needed for moderate pain or severe pain.            Discharge Care Instructions  (From admission, onward)         Start     Ordered   03/13/19 0000  Discharge wound care:    Comments: It is good for closed incisions and even open wounds to be washed every day.  Shower every day.  Short baths are fine.  Wash the incisions and wounds clean with soap & water.    You may leave closed incisions open to air if it is dry.   You may cover the incision with clean gauze & replace it after your daily shower for comfort.   03/13/19 1325  03/11/19 0000  Discharge wound care:    Comments: It is good for closed incisions and even open wounds to be washed every day.  Shower every day.  Short baths are fine.  Wash the incisions and wounds clean with soap & water.    You may leave closed incisions open to air if it is dry.   You may cover the incision with clean gauze & replace it after your daily shower for comfort.   03/11/19 1426          Significant Diagnostic Studies:  Results for orders placed or performed during the hospital encounter of 03/11/19 (from the past 72 hour(s))  Glucose, capillary     Status: None   Collection Time: 03/11/19  2:40 PM  Result Value Ref Range   Glucose-Capillary 89 70 - 99 mg/dL  Glucose, capillary     Status: Abnormal   Collection Time: 03/11/19  4:24 PM   Result Value Ref Range   Glucose-Capillary 192 (H) 70 - 99 mg/dL  Glucose, capillary     Status: Abnormal   Collection Time: 03/11/19  5:27 PM  Result Value Ref Range   Glucose-Capillary 157 (H) 70 - 99 mg/dL  Glucose, capillary     Status: Abnormal   Collection Time: 03/11/19 10:08 PM  Result Value Ref Range   Glucose-Capillary 150 (H) 70 - 99 mg/dL  Basic metabolic panel     Status: Abnormal   Collection Time: 03/12/19  4:02 AM  Result Value Ref Range   Sodium 136 135 - 145 mmol/L   Potassium 4.0 3.5 - 5.1 mmol/L   Chloride 105 98 - 111 mmol/L   CO2 22 22 - 32 mmol/L   Glucose, Bld 166 (H) 70 - 99 mg/dL   BUN 9 6 - 20 mg/dL   Creatinine, Ser 0.79 0.61 - 1.24 mg/dL   Calcium 8.1 (L) 8.9 - 10.3 mg/dL   GFR calc non Af Amer >60 >60 mL/min   GFR calc Af Amer >60 >60 mL/min   Anion gap 9 5 - 15    Comment: Performed at [facility], [place] [address], [place], [place] [address]  CBC     Status: Abnormal   Collection Time: 03/12/19  4:02 AM  Result Value Ref Range   WBC 8.1 4.0 - 10.5 K/uL   RBC 3.95 (L) 4.22 - 5.81 MIL/uL   Hemoglobin 11.1 (L) 13.0 - 17.0 g/dL   HCT 33.5 (L) 39.0 - 52.0 %   MCV 84.8 80.0 - 100.0 fL   MCH 28.1 26.0 - 34.0 pg   MCHC 33.1 30.0 - 36.0 g/dL   RDW 12.3 11.5 - 15.5 %   Platelets 213 150 - 400 K/uL   nRBC 0.0 0.0 - 0.2 %    Comment: Performed at [facility], [place] [address], [place], [place] [address]  Magnesium     Status: None   Collection Time: 03/12/19  4:02 AM  Result Value Ref Range   Magnesium 2.0 1.7 - 2.4 mg/dL    Comment: Performed at [facility], [place] [address], [place], [place] [address]  Glucose, capillary     Status: Abnormal   Collection Time: 03/12/19  7:49 AM  Result Value Ref Range   Glucose-Capillary 102 (H) 70 - 99 mg/dL   Comment 1 Notify RN   Glucose, capillary     Status: Abnormal   Collection Time: 03/12/19 12:31 PM  Result Value Ref Range   Glucose-Capillary  125  (H) 70 - 99 mg/dL   Comment 1 Notify RN   Glucose, capillary     Status: Abnormal   Collection Time: 03/12/19  5:16 PM  Result Value Ref Range   Glucose-Capillary 194 (H) 70 - 99 mg/dL  Glucose, capillary     Status: Abnormal   Collection Time: 03/12/19 10:09 PM  Result Value Ref Range   Glucose-Capillary 103 (H) 70 - 99 mg/dL  Glucose, capillary     Status: Abnormal   Collection Time: 03/13/19  7:25 AM  Result Value Ref Range   Glucose-Capillary 113 (H) 70 - 99 mg/dL  Glucose, capillary     Status: Abnormal   Collection Time: 03/13/19 11:44 AM  Result Value Ref Range   Glucose-Capillary 170 (H) 70 - 99 mg/dL    No results found.  Past Medical History:  Diagnosis Date  . Acute respiratory disease due to COVID-19 virus - June 2020 01/05/2019  . Adenocarcinoma of sigmoid colon ([place]) 12/22/2018   Malignant partially obstructing tumor in the proximal sigmoid colon  . Allergic rhinitis   . Chronic headaches   . Depression   . Diabetes mellitus, type 2 ([place]) 12/25/2018   new diagnosis, A1C 9.2% 12/25/2018  . GERD (gastroesophageal reflux disease)   . History of COVID-19 respiratory pneumonitis June 2020 03/11/2019    Past Surgical History:  Procedure Laterality Date  . COLONOSCOPY    . no surgical history    . PROCTOSCOPY N/A 03/11/2019   Procedure: RIGID PROCTOSCOPY;  Surgeon: Michael Boston, MD;  Location: [facility];  Service: General;  Laterality: N/A;  . XI ROBOTIC ASSISTED COLOSTOMY TAKEDOWN N/A 03/11/2019   Procedure: XI ROBOTIC ASSISTED LEFT COLECTOMY, MOBILIZATION OF SPLENIC FLEXURE, BILATERAL TAP BLOCK;  Surgeon: Michael Boston, MD;  Location: [facility];  Service: General;  Laterality: N/A;    Social History   Socioeconomic History  . Marital status: Married    Spouse name: Not on file  . Number of children: Not on file  . Years of education: Not on file  . Highest education level: Not on file  Occupational History  . Not on file  Social Needs  . Financial resource  strain: Not on file  . Food insecurity    Worry: Not on file    Inability: Not on file  . Transportation needs    Medical: Not on file    Non-medical: Not on file  Tobacco Use  . Smoking status: Never Smoker  . Smokeless tobacco: Never Used  Substance and Sexual Activity  . Alcohol use: Not Currently    Comment: occ  . Drug use: No  . Sexual activity: Not on file  Lifestyle  . Physical activity    Days per week: Not on file    Minutes per session: Not on file  . Stress: Not on file  Relationships  . Social Herbalist on phone: Not on file    Gets together: Not on file    Attends religious service: Not on file    Active member of club or organization: Not on file    Attends meetings of clubs or organizations: Not on file    Relationship status: Not on file  . Intimate partner violence    Fear of current or ex partner: Not on file    Emotionally abused: Not on file    Physically abused: Not on file    Forced sexual activity: Not on file  Other Topics Concern  .  Not on file  Social History Narrative  . Not on file    Family History  Problem Relation Age of Onset  . Colon cancer Neg Hx   . Esophageal cancer Neg Hx   . Rectal cancer Neg Hx   . Stomach cancer Neg Hx     Current Facility-Administered Medications  Medication Dose Route Frequency Provider Last Rate Last Dose  . 0.9 %  sodium chloride infusion   Intravenous Q8H PRN Michael Boston, MD      . acetaminophen (TYLENOL) tablet 1,000 mg  1,000 mg Oral Lajuana Ripple, MD   1,000 mg at 03/13/19 1320  . alum & mag hydroxide-simeth (MAALOX/MYLANTA) 200-200-20 MG/5ML suspension 30 mL  30 mL Oral Q6H PRN Michael Boston, MD      . diphenhydrAMINE (BENADRYL) 12.5 MG/5ML elixir 12.5 mg  12.5 mg Oral Q6H PRN Michael Boston, MD       Or  . diphenhydrAMINE (BENADRYL) injection 12.5 mg  12.5 mg Intravenous Q6H PRN Michael Boston, MD      . enoxaparin (LOVENOX) injection 40 mg  40 mg Subcutaneous Q24H Michael Boston,  MD   40 mg at 03/13/19 0849  . feeding supplement (ENSURE SURGERY) liquid 237 mL  237 mL Oral BID BM Michael Boston, MD   237 mL at 03/12/19 1031  . gabapentin (NEURONTIN) capsule 200 mg  200 mg Oral TID Michael Boston, MD   200 mg at 03/13/19 1042  . hydrALAZINE (APRESOLINE) injection 10 mg  10 mg Intravenous Q2H PRN Michael Boston, MD      . HYDROmorphone (DILAUDID) injection 0.5-2 mg  0.5-2 mg Intravenous Q4H PRN Michael Boston, MD   1 mg at 03/12/19 0830  . insulin aspart (novoLOG) injection 0-15 Units  0-15 Units Subcutaneous TID WC Michael Boston, MD   3 Units at 03/13/19 1321  . lactated ringers bolus 1,000 mL  1,000 mL Intravenous Q8H PRN Michael Boston, MD      . lip balm (CARMEX) ointment 1 application  1 application Topical BID Michael Boston, MD   1 application at [id number]  . magic mouthwash  15 mL Oral QID PRN Michael Boston, MD      . methocarbamol (ROBAXIN) 1,000 mg in dextrose 5 % 50 mL IVPB  1,000 mg Intravenous Q6H PRN Michael Boston, MD 100 mL/hr at 03/13/19 0642 1,000 mg at 03/13/19 0642  . metoprolol tartrate (LOPRESSOR) injection 5 mg  5 mg Intravenous Q6H PRN Michael Boston, MD      . ondansetron [facility]) tablet 4 mg  4 mg Oral Q6H PRN Michael Boston, MD       Or  . ondansetron (ZOFRAN) injection 4 mg  4 mg Intravenous Q6H PRN Michael Boston, MD   4 mg at 03/12/19 0830  . prochlorperazine (COMPAZINE) tablet 10 mg  10 mg Oral Q6H PRN Michael Boston, MD       Or  . prochlorperazine (COMPAZINE) injection 5-10 mg  5-10 mg Intravenous Q6H PRN Michael Boston, MD   10 mg at 03/12/19 1330  . saccharomyces boulardii (FLORASTOR) capsule 250 mg  250 mg Oral BID Michael Boston, MD   250 mg at 03/13/19 1042  . SUMAtriptan (IMITREX) tablet 50 mg  50 mg Oral Q2H PRN Michael Boston, MD      . traMADol Veatrice Bourbon) tablet 50-100 mg  50-100 mg Oral Q6H PRN Michael Boston, MD   100 mg at 03/12/19 1647     No Known Allergies  Signed: [name]  [name], MD, FACS, MASCRS Gastrointestinal  and Minimally Invasive Surgery    [address] [place], [place] [address] [phone] Main / Paging [phone] Fax   03/13/2019, 1:25 PM

## 2019-03-16 ENCOUNTER — Other Ambulatory Visit: Payer: Self-pay

## 2019-03-16 DIAGNOSIS — C187 Malignant neoplasm of sigmoid colon: Secondary | ICD-10-CM

## 2019-03-23 ENCOUNTER — Other Ambulatory Visit (HOSPITAL_COMMUNITY): Payer: BC Managed Care – PPO

## 2019-03-25 ENCOUNTER — Ambulatory Visit: Payer: Self-pay | Admitting: Surgery

## 2019-03-26 ENCOUNTER — Other Ambulatory Visit: Payer: Self-pay

## 2019-03-26 ENCOUNTER — Inpatient Hospital Stay: Payer: BC Managed Care – PPO | Attending: Oncology | Admitting: Oncology

## 2019-03-26 ENCOUNTER — Telehealth: Payer: Self-pay | Admitting: Oncology

## 2019-03-26 VITALS — BP 114/83 | HR 69 | Temp 98.8°F | Resp 17 | Ht 67.0 in | Wt 162.2 lb

## 2019-03-26 DIAGNOSIS — E119 Type 2 diabetes mellitus without complications: Secondary | ICD-10-CM

## 2019-03-26 DIAGNOSIS — Z9049 Acquired absence of other specified parts of digestive tract: Secondary | ICD-10-CM

## 2019-03-26 DIAGNOSIS — Z5111 Encounter for antineoplastic chemotherapy: Secondary | ICD-10-CM | POA: Insufficient documentation

## 2019-03-26 DIAGNOSIS — Z8619 Personal history of other infectious and parasitic diseases: Secondary | ICD-10-CM | POA: Insufficient documentation

## 2019-03-26 DIAGNOSIS — C187 Malignant neoplasm of sigmoid colon: Secondary | ICD-10-CM | POA: Diagnosis not present

## 2019-03-26 DIAGNOSIS — R918 Other nonspecific abnormal finding of lung field: Secondary | ICD-10-CM | POA: Insufficient documentation

## 2019-03-26 DIAGNOSIS — Z79899 Other long term (current) drug therapy: Secondary | ICD-10-CM | POA: Insufficient documentation

## 2019-03-26 DIAGNOSIS — Z7984 Long term (current) use of oral hypoglycemic drugs: Secondary | ICD-10-CM | POA: Insufficient documentation

## 2019-03-26 NOTE — Progress Notes (Signed)
[name] Patient Consult   Requesting MD: Oscar Mccarty 53 y.o.  [birth year]    Reason for Consult: Colon cancer   HPI: Oscar Mccarty developed constipation, urgency, rectal bleeding, and abdominal pain.  He was referred to Dr. Lyndel Mccarty and was taken to a colonoscopy on 12/19/2018.  A partially obstructing mass was found in the proximal sigmoid/distal descending colon at 35-38 cm.  A biopsy was obtained and the area was tattooed.  The biopsy confirmed adenocarcinoma.  CTs of the chest, abdomen, and pelvis on 12/24/2018 revealed no suspicious pulmonary nodule.  Tiny bilateral pulmonary nodules are unchanged from 2007.  No liver abnormality.  Circumferential wall thickening is noted in the proximal sigmoid colon.  No obstruction.  Multiple pericolonic lymph nodes in the left lower quadrant concerning for local nodal metastases.   He developed a fever, cough, nausea/vomiting, and diarrhea.  He was diagnosed with COVID-19 infection and admitted to the [facility] 01/05/2019.  He was treated with oxygen, remdesivir,actemra,  and steroids.  He was discharged home on 01/10/2019.  He reports resolution of the COVID symptoms.  He was referred to Dr. Johney Mccarty and underwent a robotic assisted partial colectomy on 03/10/2019.  A tumor was noted in the distal descending colon.  No evidence of metastatic disease.  The pathology ([id number]) revealed adenocarcinoma of the sigmoid colon.  No macroscopic tumor perforation.  Tumor invaded the visceral peritoneum.  All resection margins were negative.  Lymphovascular invasion is present.  No perineural invasion.  One tumor deposit.  13 lymph nodes were negative for metastatic carcinoma.  The tumor returned microsatellite stable with no loss/repair protein expression.  Oscar Mccarty has recovered from surgery.  He is having bowel movements.  The abdominal pain has resolved.  He was recently diagnosed with diabetes and reports developing  symptomatic hypoglycemia while on 2 diabetes medications.  He is now taking metformin as a single agent.  The blood sugar is now running about 100.  Past Medical History:  Diagnosis Date  . Acute respiratory disease due to COVID-19 virus - June 2020 01/05/2019  . Adenocarcinoma of sigmoid colon ([place]) 12/22/2018   Malignant partially obstructing tumor in the proximal sigmoid colon  . Allergic rhinitis   . Chronic headaches   . Depression   . Diabetes mellitus, type 2 ([place]) 12/25/2018   new diagnosis, A1C 9.2% 12/25/2018  . GERD (gastroesophageal reflux disease)   .      Past Surgical History:  Procedure Laterality Date  . COLONOSCOPY    . no surgical history    . PROCTOSCOPY N/A 03/11/2019   Procedure: RIGID PROCTOSCOPY;  Surgeon: Oscar Boston, MD;  Location: [facility];  Service: General;  Laterality: N/A;  . XI ROBOTIC ASSISTED COLOSTOMY TAKEDOWN N/A 03/11/2019   Procedure: XI ROBOTIC ASSISTED LEFT COLECTOMY, MOBILIZATION OF SPLENIC FLEXURE, BILATERAL TAP BLOCK;  Surgeon: Oscar Boston, MD;  Location: [facility];  Service: General;  Laterality: N/A;    Medications: Reviewed  Allergies: No Known Allergies  Family history: No family history of cancer  Social History:   He lives with his wife and child in [place].  He works as a Glass blower/designer.  Use of cigarettes or alcohol.  No risk factors for HIV or hepatitis.  ROS:   Positives include: 30 pound weight loss coincident with the COVID infection, nausea/vomiting/diarrhea prior to being diagnosed with COVID-19, rectal bleeding, urgency, constipation, and abdominal pain prior to colon surgery.  Cough and dyspnea June 2020  A complete ROS was otherwise negative.  Physical Exam:  Blood pressure 114/83, pulse 69, temperature 98.8 F (37.1 C), temperature source Temporal, resp. rate 17, height 5' 7" (1.702 m), weight 162 lb 3.2 oz (73.6 kg), SpO2 100 %.  HEENT: Neck without mass Lungs: Clear bilaterally Cardiac: Regular rate and  rhythm Abdomen: No hepatosplenomegaly, no mass, nontender GU: Testes without mass Vascular: No leg edema Lymph nodes: No cervical, supraclavicular, axillary, or inguinal nodes Neurologic: Alert and oriented, the motor exam appears intact in the upper and lower extremities bilaterally Skin: No rash, healed trocar incisions, gauze dressing covering in the low transverse incision Musculoskeletal: No spine tenderness   LAB:  CBC  Lab Results  Component Value Date   WBC 8.1 03/12/2019   HGB 11.1 (L) 03/12/2019   HCT 33.5 (L) 03/12/2019   MCV 84.8 03/12/2019   PLT 213 03/12/2019   NEUTROABS 3.4 01/10/2019        CMP  Lab Results  Component Value Date   NA 136 03/12/2019   K 4.0 03/12/2019   CL 105 03/12/2019   CO2 22 03/12/2019   GLUCOSE 166 (H) 03/12/2019   BUN 9 03/12/2019   CREATININE 0.79 03/12/2019   CALCIUM 8.1 (L) 03/12/2019   PROT 6.1 (L) 01/10/2019   ALBUMIN 2.7 (L) 01/10/2019   AST 38 01/10/2019   ALT 124 (H) 01/10/2019   ALKPHOS 80 01/10/2019   BILITOT 0.7 01/10/2019   GFRNONAA >60 03/12/2019   GFRAA >60 03/12/2019   CEA on 12/24/2018: 1.6   Imaging: CT images from 12/24/2018 reviewed  Assessment/Plan:   1. Sigmoid colon cancer, stage IIIb (pT4a,pN1c), status post a partial left colectomy 03/11/2019  Microsatellite stable, no loss of mismatch repair protein expression  CTs 12/24/2018- sigmoid: Wall thickening, multiple enlarged nodes adjacent to the sigmoid colon, no evidence of metastatic disease  2. COVID-19 respiratory infection June 2020 3. Diabetes diagnosed June 2020   Disposition:   Oscar Mccarty has been diagnosed with stage III adenocarcinoma of the sigmoid colon.  I discussed the gnosis and reviewed details of the surgery pathology report with him.  His case was presented at the GI tumor conference earlier this week. He has a significant chance of developing recurrent colon cancer over the next several years.  I recommended adjuvant systemic  chemotherapy.  We discussed the benefit associated with adjuvant 5-fluorouracil and oxaliplatin chemotherapy.  He has high risk stage III disease based on the T4 tumor.  I recommend 6 months of FOLFOX.  We reviewed potential toxicities associated with the FOLFOX regimen including the chance for nausea/vomiting, alopecia, and hematologic toxicity.  We discussed the rash, sun sensitivity, hyperpigmentation, and hand/foot syndrome associated with 5-fluorouracil.  We discussed the allergic reaction and various types of neuropathy seen with oxalic platinum.  He will attend chemotherapy teaching class.  He agrees to proceed.  He has been referred to Dr. Johney Mccarty for placement of a Port-A-Cath.  He does not appear to have hereditary non-polyposis colon cancer syndrome, but his family members are at increased risk of developing colorectal cancer and should receive appropriate screening. The plan is to begin adjuvant FOLFOX on 04/13/2019.  He will be seen for an office visit that day.  A chemotherapy plan was entered.  Betsy Coder, MD  03/26/2019, 1:59 PM

## 2019-03-26 NOTE — Telephone Encounter (Signed)
Scheduled per los. Gave avs and calendar  

## 2019-03-26 NOTE — Progress Notes (Signed)
START ON PATHWAY REGIMEN - Colorectal     A cycle is every 14 days:     Oxaliplatin      Leucovorin      Fluorouracil      Fluorouracil   **Always confirm dose/schedule in your pharmacy ordering system**  Patient Characteristics: Postoperative without Neoadjuvant Therapy (Pathologic Staging), Colon, Stage III, High Risk (pT4 or pN2) Tumor Location: Colon Therapeutic Status: Postoperative without Neoadjuvant Therapy (Pathologic Staging) AJCC M Category: Staged < 8th Ed. AJCC T Category: Staged < 8th Ed. AJCC N Category: Staged < 8th Ed. AJCC 8 Stage Grouping: Staged < 8th Ed. Intent of Therapy: Curative Intent, Discussed with Patient 

## 2019-03-31 ENCOUNTER — Inpatient Hospital Stay: Payer: BC Managed Care – PPO

## 2019-03-31 ENCOUNTER — Other Ambulatory Visit: Payer: Self-pay

## 2019-03-31 MED ORDER — LIDOCAINE-PRILOCAINE 2.5-2.5 % EX CREA: 1.0000 "application " | TOPICAL_CREAM | CUTANEOUS | 0 refills | Status: DC | PRN

## 2019-03-31 MED ORDER — PROCHLORPERAZINE MALEATE 10 MG PO TABS: 10.0000 mg | ORAL_TABLET | Freq: Four times a day (QID) | ORAL | 0 refills | Status: DC | PRN

## 2019-04-01 ENCOUNTER — Ambulatory Visit: Payer: Self-pay | Admitting: Surgery

## 2019-04-01 NOTE — H&P (View-Only) (Signed)
Oscar Mccarty Documented: 04/01/2019 4:29 PM Location: [facility] Patient #: [id number] DOB: 1966/02/20 Married / Language: Cleophus Molt / Race: White Male  History of Present Illness Oscar Hector MD; 04/01/2019 5:05 PM) The patient is a 53 year old male who presents with colorectal cancer. 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      The patient returns to clinic after surgery, gradually improving. He was diagnosed with Covid pneumonitis and was hospitalized. That delayed his surgery. However once he had the surgery he did well and went home in a few days. Some discomfort when he strains to defecate, but otherwise feeling better. He ran out of his tramadol wonders if he can have a refill since he still having some soreness and discomfort. Pain from the incisions is fading away. Denies nausea, constipation/diarrhea, worsening fatigue, high fevers, or other concerns.  Patient notes his appetite is improved. He is moving his bowels about twice a day. He's not really on a fiber supplement. He is due to get chemotherapy later this month. Due to place a porta catheter in him next week. `  Pathology: Diagnosis 1. Colon, segmental resection for tumor, sigmoid colon and descending colon open end proximal - ADENOCARCINOMA, MODERATELY DIFFERENTIATED, 4.0 CM - CARCINOMA FOCALLY INVOLVES SEROSA - ONE TUMOR DEPOSIT - LYMPHOVASCULAR SPACE INVASION PRESENT - NO CARCINOMA IDENTIFIED IN THIRTEEN LYMPH NODES (0/13) - SEE ONCOLOGY TABLE AND COMMENT BELOW 2. Colon, resection margin (donut), final distal margin - BENIGN COLON - NO MALIGNANCY IDENTIFIED Microscopic Comment 1. COLON AND RECTUM: Resection, Including Transanal Disk Excision of Rectal Neoplasms Procedure: Left hemicolectomy Tumor Site: Sigmoid Tumor Size: 4.0 cm Macroscopic  Tumor Perforation: Not identified Histologic Type: Adenocarcinoma Histologic Grade: G2 (Moderately differentiated) Tumor Extension: Tumor invades the visceral peritoneum Margins: All margins uninvolved by invasive carcinoma, high grade dysplasia/intramucosal carcinoma, and low grade dysplasia Treatment effect: No known presurgical therapy Lymphovascular Invasion: Present Perineural Invasion: Not identified Tumor Deposits: Present, one Regional Lymph Nodes: Number of Lymph Nodes Involved: 13 Number of Lymph Nodes Examined: 0 Pathologic Stage Classification (pTNM, AJCC 8th Edition): pT4a, pN1c Ancillary Studies: Pending 1 of 3 FINAL for Oscar Mccarty, Oscar Mccarty [id number]) Microscopic Comment(continued) Representative tumor block: 1C Comments: Oscar. Vic Ripper reviewed the case and agrees with the above diagnosis. (v4.0.1.0) Oscar Sheller MD Pathologist, Electronic Signature (Case signed 03/16/2019) Specimen Oscar Mccarty and Clinical Information Specimen(s) Obtained: 1. Colon, segmental resection for tumor, sigmoid colon and descending colon open end proximal 2. Colon, resection margin (donut), final distal margin Specimen Clinical Information 1. colon cancer (kp) Oscar Mccarty 1. Specimen: Descending colon and sigmoid, open end is proximal. Specimen integrity: Intact. Specimen length: 28 cm. Mesorectal intactness: Not applicable. Tumor size: 4 cm in length, 4 cm in width, tan-red firm sessile mass with rolled edges. Percent of bowel circumference involved: 100%. Tumor distance to margins: Proximal: 7 cm. Distal: 17 cm. Mesenteric (sigmoid and transverse): 5 cm. Macroscopic extent of tumor invasion: On sectioning, the tumor mass is up to 1.6 cm thick, involving the entire thickness of the wall and into underlying fat. The tumor also abuts the serosa, with focal possible involvement of serosa. Total presumed lymph nodes: Found are fourteen possible lymph nodes ranging from 0.3 to 2.1 cm. The largest has  gray-white to yellow-red firm to friable cut surfaces. Extramural satellite tumor nodules: None. Mucosal polyp(s): None. Additional findings: None. Block summary: Thirteen blocks A =  proximal margin B = distal margin C = deepest tumor invasion into fat D-E = tumor mass with possible serosal involvement. F = tumor and adjacent mucosa G = tissue for molecular testing H = four nodes I = three nodes J = three nodes K = three nodes L-M = largest node. 2. In formalin is a 1.4 cm in length and 2.2 cm in diameter ring of tan-pink smooth to wrinkled mucosa with underlying embedded metallic staples in the wall. Representative sections in one block. (SSW:ah 03/13/19) 2 of 3 FINAL for Oscar Mccarty [id number]) Disclaimer Some of these immunohistochemical stains may have been developed and the performance characteristics determined by [facility]. Some may not have been cleared or approved by the U.S. Food and Drug Administration. The FDA has determined that such clearance or approval is not necessary. This test is used for clinical purposes. It should not be regarded as investigational or for research. This laboratory is certified under the [place] (CLIA-88) as qualified to perform high complexity clinical laboratory testing. Report signed out from the following location(s) Technical Component was performed at [facility], [place], [place] [address]. CLIA #: [id number], Interpretation was performed at [facility], [place], [place] [address]. CLIA #: [id number], 3 of 3   Result Notes for Surgical pathology  Notes recorded by Oscar Boston, MD on 03/16/2019 at 12:11 PM EDT Patient s/p surgery for Descending colon cancer 03/11/2019 pT4a, pN1c Oscar Mccarty, CCS CMA: 1. Please set the patient up for GI Tumor Board. Patient Care Team: Oscar Skeens., MD as PCP - General  (Family Medicine) Oscar Pier, MD as Consulting Physician (Oncology) Oscar Denmark, MD as Consulting Physician (Gastroenterology) Oscar Boston, MD as Consulting Physician (General Surgery) 2. Please set up consult(s): Oscar Mccarty Diagnosis 1. Colon, segmental resection for tumor, sigmoid colon and descending colon open end proximal - ADENOCARCINOMA, MODERATELY DIFFERENTIATED, 4.0 CM - CARCINOMA FOCALLY INVOLVES SEROSA - ONE TUMOR DEPOSIT - LYMPHOVASCULAR SPACE INVASION PRESENT - NO CARCINOMA IDENTIFIED IN THIRTEEN LYMPH NODES (0/13) - SEE ONCOLOGY TABLE AND COMMENT BELOW 2. Colon, resection margin (donut), final distal margin - BENIGN COLON - NO MALIGNANCY IDENTIFIED Microscopic Comment 1. COLON AND RECTUM: Resection, Including Transanal Disk Excision of Rectal Neoplasms Procedure: Left hemicolectomy Tumor Site: Sigmoid Tumor Size: 4.0 cm Macroscopic Tumor Perforation: Not identified Histologic Type: Adenocarcinoma Histologic Grade: G2 (Moderately differentiated) Tumor Extension: Tumor invades the visceral peritoneum Margins: All margins uninvolved by invasive carcinoma, high grade dysplasia/intramucosal carcinoma, and low grade dysplasia Treatment effect: No known presurgical therapy Lymphovascular Invasion: Present Perineural Invasion: Not identified Tumor Deposits: Present, one Regional Lymph Nodes: Number of Lymph Nodes Involved: 13 Number of Lymph Nodes Examined: 0 Pathologic Stage Classification (pTNM, AJCC 8th Edition): pT4a, pN1c Ancillary Studies: Pending 1 of 3 FINAL for Mullenax, Meshilem [id number]) Microscopic Comment(continued) Representative tumor block: 1C Comments: Oscar. Vic Ripper reviewed the case and agrees with the above diagnosis. (v4.0.1.0) Oscar Sheller MD Pathologist, Electronic Signature (Case signed 03/16/2019) Specimen Zettie Gootee and Clinical Information Specimen(s) Obtained: 1. Colon, segmental resection for tumor, sigmoid colon and descending  colon open end proximal 2. Colon, resection margin (donut), final distal margin Specimen Clinical Information 1. colon cancer (kp) Kavon Valenza 1. Specimen: Descending colon and sigmoid, open end is proximal. Specimen integrity: Intact. Specimen length: 28 cm. Mesorectal intactness: Not applicable. Tumor size: 4 cm in length, 4 cm in width, tan-red firm sessile mass with rolled edges. Percent of bowel  circumference involved: 100%. Tumor distance to margins: Proximal: 7 cm. Distal: 17 cm. Mesenteric (sigmoid and transverse): 5 cm. Macroscopic extent of tumor invasion: On sectioning, the tumor mass is up to 1.6 cm thick, involving the entire thickness of the wall and into underlying fat. The tumor also abuts the serosa, with focal possible involvement of serosa. Total presumed lymph nodes: Found are fourteen possible lymph nodes ranging from 0.3 to 2.1 cm. The largest has gray-white to yellow-red firm to friable cut surfaces. Extramural satellite tumor nodules: None. Mucosal polyp(s): None. Additional findings: None. Block summary: Thirteen blocks A = proximal margin B = distal margin C = deepest tumor invasion into fat D-E = tumor mass with possible serosal involvement. F = tumor and adjacent mucosa G = tissue for molecular testing H = four nodes I = three nodes J = three nodes K = three nodes L-M = largest node. 2. In formalin is a 1.4 cm in length and 2.2 cm in diameter ring of tan-pink smooth to wrinkled mucosa with underlying embedded metallic staples in the wall. Representative sections in one block. (SSW:ah 03/13/19) 2 of 3 FINAL for Ruesch, Krishon [id number]) Disclaimer Some of these immunohistochemical stains may have been developed and the performance characteristics determined by [facility]. Some may not have been cleared or approved by the U.S. Food and Drug Administration. The FDA has determined that such clearance or approval is not necessary. This test is  used for clinical purposes. It should not be regarded as investigational or for research. This laboratory is certified under the [place] (CLIA-88) as qualified to perform high complexity clinical laboratory testing. Report signed out from the following location(s) Technical Component was performed at [facility] [place], [place], [place] [address]. CLIA #: [id number], Interpretation was performed at [facility], [place], [place] [address]. CLIA #: [id number], 3 of 3  ` ` `  03/11/2019  5:12 PM  PATIENT: Oscar Mccarty 53 y.o. male  Patient Care Team: Oscar Skeens., MD as PCP - General (Family Medicine) Oscar Pier, MD as Consulting Physician (Oncology) Oscar Denmark, MD as Consulting Physician (Gastroenterology) Oscar Boston, MD as Consulting Physician (General Surgery)  PRE-OPERATIVE DIAGNOSIS: SIGMOID COLON CANCER  POST-OPERATIVE DIAGNOSIS: DISTAL DESCENDING COLON CANCER  PROCEDURE:  XI ROBOTIC ASSISTED PARTIAL COLECTOMY (DESCENDING & SIGMOID COLON) MOBILIZATION OF SPLENIC FLEXURE BILATERAL TAP BLOCK RIGID PROCTOSCOPY  SURGEON: Oscar Hector, MD  ASSISTANT: Carlena Hurl, PA-C  ANESTHESIA: local and general  EBL: Total I/O In: 76 [I.V.:3000] Out: 200 [Urine:150; Blood:50]  Delay start of Pharmacological VTE agent (>24hrs) due to surgical blood loss or risk of bleeding: no  DRAINS: No  SPECIMEN: Descending and sigmoid colon. Open end proximal. Strongest tattooed near the obvious cancer  DISPOSITION OF SPECIMEN: PATHOLOGY  COUNTS: YES  PLAN OF CARE: Admit to inpatient  PATIENT DISPOSITION: PACU - hemodynamically stable.  INDICATION:   Pleasant gentleman developed worsening rectal bleeding. Found to have bulky tumor in upper sigmoid colon. Surgery recommended. He ended up developing COVID pneumonitis requiring hospitalization in  June. He is recovered and is now COVID negative. Because of his bleeding surgery moved up to this month. I recommended segmental resection:  The anatomy & physiology of the digestive tract was discussed. The pathophysiology was discussed. Natural history risks without surgery was discussed. I worked to give an overview of the disease and the frequent need to have multispecialty involvement. I feel the  risks of no intervention will lead to serious problems that outweigh the operative risks; therefore, I recommended a partial colectomy to remove the pathology. Laparoscopic & open techniques were discussed.  Risks such as bleeding, infection, abscess, leak, reoperation, possible ostomy, hernia, heart attack, death, and other risks were discussed. I noted a good likelihood this will help address the problem. Goals of post-operative recovery were discussed as well. We will work to minimize complications. Educational materials on the pathology had been given in the office. Questions were answered.   The patient expressed understanding & wished to proceed with surgery.  OR FINDINGS:  Patient had redundant descending and sigmoid colon with bulky tumor at the distal descending colon. Left conization done. Splenic flexure mobilization done to have tension-free reaching to the pelvis.  No obvious metastatic disease on visceral parietal peritoneum or liver.  The anastomosis rests 14 cm from the anal verge by rigid proctoscopy.  DESCRIPTION:  Informed consent was confirmed. The patient underwent general anaesthesia without difficulty. The patient was positioned appropriately. VTE prevention in place. The patient was clipped, prepped, & draped in a sterile fashion. Surgical timeout confirmed our plan.  The patient was positioned in reverse Trendelenburg. Abdominal entry was gained using Varess technique at the left subcostal ridge on the anterior abdominal wall. No elevated EtCO2  noted. Port placed. Camera inspection revealed no injury. Extra ports were carefully placed under direct laparoscopic visualization. Could see strongest tattoo at the distal descending colon. Sigmoid rather redundant and corkscrewed upon itself. I reflected the greater omentum and the upper abdomen the small bowel in the upper abdomen. The patient was carefully positioned. The Intuitive daVinci robot was docked with camera & instruments carefully placed.  I scored the base of peritoneum of the medial side of the mesentery of the elevated left colon from the ligament of Treitz to the peritoneal reflection of the mid rectum. I elevated the sigmoid mesentery and entered into the retro-mesenteric plane. We were able to identify the left ureter and gonadal vessels. We kept those posterior within the retroperitoneum and elevated the left colon mesentery off that. I did isolate the inferior mesenteric artery (IMA) pedicle but did not ligate it yet. I continued distally and got into the avascular plane posterior to the mesorectum. His tissues were somewhat fibrotic in areas and very stretched out/rubbery and others. Eventually I was able to mobilize the rectum as well by freeing the mesorectum off the sacrum. Identified the nervi ergentes days and kept them in the natural retroperitoneal midline position in its usual wishbone pattern. I mobilized the peritoneal coverings towards the peritoneal reflection on both the right and left sides down to the junction between the proximal and mid rectum. I stayed away from the right and left ureters. I kept the lateral vascular pedicles to the rectum intact.  I was able to come between the window between the inferior mesenteric vein and artery. Free off the mid and proximal colon off the kidney. Identify and preserve the ureter and gonadal vessels and keep them in the natural retroperitoneal position. I skeletonized the lymph nodes off the inferior mesenteric  artery pedicle. I went down to its takeoff from the aorta. I isolated the inferior mesenteric vein off of the ligament of Treitz just cephalad to that as well. After confirming the left ureter was out of the way, I went ahead and ligated the inferior mesenteric artery pedicle just near its takeoff from the aorta. I did ligate the inferior mesenteric vein in a  similar fashion. We ensured hemostasis. I skeletonized the mesorectum at the junction at the proximal rectum for the distal point of resection. I mobilized the left colon in a lateral to medial fashion off the line of Toldt up towards the splenic flexure to ensure good mobilization of the remaining left colon to reach into the pelvis. Unfortunately he did not seem to wish to mobilize well.  Therefore I decided to do more formal splenic flexure mobilization. I reposition the patient in reversal [place]. Transected the greater omentum off the mid transverse colon and follow that distally. Was able to get the lesser sac and free the great omentum towards the splenic flexure. I could see his pancreas and I was able to get a window between the inferior pancreatic edge and the retroperitoneum to connect with my prior retroperitoneal dissection. Without I transected the retroperitoneal attachments of the distal transverse colon and splenic flexure off the retroperitoneum especially to the kidney. Did that towards the midline. With that I got excellent mobilization of the entire left colon. That released the kinked up splenic flexure straighten it out and it came down much more easily off tension. Repositioned the patient in Trendelenburg.  I skeletonized at the proximal mesorectum and transected at the proximal rectum using a robotic 45 mm stapler. I chose a region at the proximal descending colon close to the splenic flexure that was soft and easily reached down to the rectal stump. I transected the mesentery of the colon radially to preserve  remaining colon blood supply, ensuring that the inferior mesenteric vein and arterial pedicles were with the specimen. Preserving a good marginal artery.  I created an extraction incision through a small Pfannenstiel incision in the suprapubic region. Placed a wound protector. I was able to eviscerate the rectosigmoid and descending colon out the wound. I clamped the colon proximal to this area using a reusable pursestringer device. Passed a 2-0 Keith needle. I transected at the descending/sigmoid junction with a scalpel. I got healthy bleeding mucosa. We sent the rectosigmoid colon specimen off to go to pathology. We sized the colon orifice. I chose a 31 EEA anvil stapler system. I reinforced the prolene pursestring with interrupted silk suture. I placed the anvil to the open end of the proximal remaining colon and closed around it using the pursestring. We did copious irrigation with crystalloid solution. Hemostasis was good. The distal end of the remaining colon easily reached down to the rectal stump, therefore, further colone mobilization was not needed.   Ms Lemar Lofty scrubbed down and did gentle anal dilation and advanced the EEA stapler up the rectal stump. The spike was brought out at the provimal end of the rectal stump under direct visualization. I attached the anvil of the proximal colon the spike of the stapler. Anvil was tightened down and held clamped for 60 seconds. The EEA stapler was fired and held clamped for 30 seconds. The stapler was released & removed. We noted 2 excellent anastomotic rings. Blue stitch is in the proximal ring. Ms Lemar Lofty We did a final irrigation of antibiotic solution (900 mg clindamycin/240 mg gentamicin in a liter of crystalloid) & held that for the pelvic air leak test . The rectum was insufflated the rectum while clamping the colon proximal to that anastomosis. There was a negative air leak test. There was no tension of mesentery or bowel at the  anastomosis. Tissues looked viable. Ureters & bowel uninjured. The anastomosis looked healthy.  Endoluminal gas was evacuated. Ports & wound protector  removed. I did rigid proctoscopy noted the anastomosis was at 14 cm from the anal verge consistent with the proximal rectum. We changed gloves & redraped the patient per colon SSI prevention protocol. We aspirated the antibiotic irrigation. Hemostasis was good. Sterile unused instruments were used from this point. I closed the skin at the port sites using Monocryl stitch and sterile dressing. I closed the extraction wound using a 0 Vicryl vertical peritoneal closure and a #1 PDS transverse anterior rectal fascial closure like a small Pfannenstiel closure. I closed the skin at the port sites and Pfannenstiel incision with 4-0 Monocryl stitches.   Patient is being extubated go to recovery room. I had discussed postop care with the patient in detail the office & in the holding area. Instructions are written. I discussed operative findings, updated the patient's status, discussed probable steps to recovery, and gave postoperative recommendations to the patient's significant other. Myrtie Soman. Recommendations were made. Questions were answered. She expressed understanding & appreciation.  Oscar Mccarty, M.D., F.A.C.S. Gastrointestinal and Minimally Invasive Surgery [facility] [address] [place], [place] [address] [phone] Main / Paging   Allergies Sallyanne Kuster, [place]; 04/01/2019 4:30 PM) No Known Drug Allergies [02/02/2019]: (Marked as Inactive) Allergies Reconciled  Medication History Sallyanne Kuster, CMA; 04/01/2019 4:33 PM) metFORMIN HCl (500MG  Tablet, Oral) Active. Ibuprofen (400MG  Tablet, Oral) Active. midazolam Active. Medications Reconciled    Vitals Sallyanne Kuster CMA; 04/01/2019 4:34 PM) 04/01/2019 4:33 PM Weight: 164.38 lb Height: 67in Body Surface Area: 1.86 m Body Mass  Index: 25.74 kg/m  Temp.: 97.10F  Pulse: 77 (Regular)  P.OX: 98% (Room air) BP: 110/75 (Sitting, Right Arm, Standard)        Physical Exam Oscar Hector MD; 04/01/2019 4:42 PM)  General Mental Status-Alert. General Appearance-Not in acute distress. Voice-Normal. Note: Relaxed. Nontoxic.  Integumentary Global Assessment Upon inspection and palpation of skin surfaces of the - Distribution of scalp and body hair is normal. General Characteristics Overall examination of the patient's skin reveals - no rashes and no suspicious lesions.  Head and Neck Head-normocephalic, atraumatic with no lesions or palpable masses. Face Global Assessment - atraumatic, no absence of expression. Neck Global Assessment - no abnormal movements, no decreased range of motion. Trachea-midline. Thyroid Gland Characteristics - non-tender.  Eye Eyeball - Left-Extraocular movements intact, No Nystagmus - Left. Eyeball - Right-Extraocular movements intact, No Nystagmus - Right. Upper Eyelid - Left-No Cyanotic - Left. Upper Eyelid - Right-No Cyanotic - Right.  Chest and Lung Exam Inspection Accessory muscles - No use of accessory muscles in breathing.  Abdomen Note: Incisions with normal healing ridges. No active bleeding. No cellulitis. No guarding/rebound tenderness  Peripheral Vascular Upper Extremity Inspection - Left - Not Gangrenous, No Petechiae. Inspection - Right - Not Gangrenous, No Petechiae.  Neurologic Neurologic evaluation reveals -normal attention span and ability to concentrate, able to name objects and repeat phrases. Appropriate fund of knowledge and normal coordination.  Neuropsychiatric Mental status exam performed with findings of-able to articulate well with normal speech/language, rate, volume and coherence and no evidence of hallucinations, delusions, obsessions or homicidal/suicidal ideation. Orientation-oriented  X3.  Musculoskeletal Global Assessment Gait and Station - normal gait and station.  Lymphatic General Lymphatics Description - No Generalized lymphadenopathy.    Assessment & Plan Oscar Hector MD; 04/01/2019 5:01 PM)  CANCER OF DESCENDING COLON (C18.6) Impression: Recovering relatively well status post robotic left hemicolectomy with splenic flexure mobilization for T4 N1 descending colon cancer.  He  is lymph node positive. It is felt he benefit from post-adjuvant chemotherapy. We're due to place a port in next week. We discussed the technique. He had questions about when to show up. We will have preop area calling clarify this with him. For some reason at some length that [place] did. It is not realistic for him to go back unrestricted only 2 weeks from colon surgery. A little more time to recover. He'll transition the chemotherapy quite quickly. Cancer center will assume FMLA once that starts later this month.  Current Plans Use of a central venous catheter for intravenous therapy was discussed. Technique of catheter placement using ultrasound and fluoroscopy guidance was discussed. Risks such as bleeding, infection, pneumothorax, catheter occlusion, reoperation, and other risks were discussed. I noted a good likelihood this will help address the problem. Questions were answered. The patient expressed understanding & wishes to proceed. Started traMADol HCl 50 MG Oral Tablet, 1-2 Tablet every six hours, as needed, #30, 04/01/2019, No Refill.  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  Soreness, decreased appetite, and poor energy level are common problems after surgery. While many people can struggle with a bad day, these concerns should gradually fade away or at least improve. Much of your recovery depends on your health & the severity of your operation. Please call if you have any further questions / concerns related to  surgery.  Increase activity as tolerated to regular everyday activity. Consider daily low impact exercise every day such as walking an hour a day.  Do not push through pain. If it hurts to do it, then don't do it.  Diet as tolerated. Low fat high fiber diet ideal. 30 g fiber a day ideal. Consider taking a daily fiber supplement to keep your bowels regular.  Followup with your primary care physician for other health issues as would normally be done.  Consider screening for malignancies (breast, prostate, colon, melanoma, etc) as appropriate. Discuss with you primary care physician.  Consider follow up colonoscopy by your gastroenterologist.  Since you had a colorectal cancer resected by surgery, you should strongly consider getting a colonoscopy by your gastroenterologict in one year after the surgery that removed your cancer. Call your gastroenterologist for advice  Pt Education - [facility] Colorectal Cancer (AT): discussed with patient and provided information. Pt Education - [facility] Good Bowel Health ([facility] [place])  Oscar Hector, MD, FACS, MASCRS Gastrointestinal and Minimally Invasive Surgery    [address], [place], [place] [address] [phone] Main / Paging [phone] Fax

## 2019-04-01 NOTE — H&P (Signed)
Oscar Mccarty Documented: 04/01/2019 4:29 PM Location: [facility] Patient #: [id number] DOB: Sep 20, 1965 Married / Language: Cleophus Molt / Race: White Male  History of Present Illness [name] MD; 04/01/2019 5:05 PM) The patient is a 53 year old male who presents with colorectal cancer. 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      The patient returns to clinic after surgery, gradually improving. He was diagnosed with Covid pneumonitis and was hospitalized. That delayed his surgery. However once he had the surgery he did well and went home in a few days. Some discomfort when he strains to defecate, but otherwise feeling better. He ran out of his tramadol wonders if he can have a refill since he still having some soreness and discomfort. Pain from the incisions is fading away. Denies nausea, constipation/diarrhea, worsening fatigue, high fevers, or other concerns.  Patient notes his appetite is improved. He is moving his bowels about twice a day. He's not really on a fiber supplement. He is due to get chemotherapy later this month. Due to place a porta catheter in him next week. `  Pathology: Diagnosis 1. Colon, segmental resection for tumor, sigmoid colon and descending colon open end proximal - ADENOCARCINOMA, MODERATELY DIFFERENTIATED, 4.0 CM - CARCINOMA FOCALLY INVOLVES SEROSA - ONE TUMOR DEPOSIT - LYMPHOVASCULAR SPACE INVASION PRESENT - NO CARCINOMA IDENTIFIED IN THIRTEEN LYMPH NODES (0/13) - SEE ONCOLOGY TABLE AND COMMENT BELOW 2. Colon, resection margin (donut), final distal margin - BENIGN COLON - NO MALIGNANCY IDENTIFIED Microscopic Comment 1. COLON AND RECTUM: Resection, Including Transanal Disk Excision of Rectal Neoplasms Procedure: Left hemicolectomy Tumor Site: Sigmoid Tumor Size: 4.0 cm Macroscopic  Tumor Perforation: Not identified Histologic Type: Adenocarcinoma Histologic Grade: G2 (Moderately differentiated) Tumor Extension: Tumor invades the visceral peritoneum Margins: All margins uninvolved by invasive carcinoma, high grade dysplasia/intramucosal carcinoma, and low grade dysplasia Treatment effect: No known presurgical therapy Lymphovascular Invasion: Present Perineural Invasion: Not identified Tumor Deposits: Present, one Regional Lymph Nodes: Number of Lymph Nodes Involved: 13 Number of Lymph Nodes Examined: 0 Pathologic Stage Classification (pTNM, AJCC 8th Edition): pT4a, pN1c Ancillary Studies: Pending 1 of 3 FINAL for Oscar Mccarty, [place] [id number]) Microscopic Comment(continued) Representative tumor block: 1C Comments: Dr. Vic Ripper reviewed the case and agrees with the above diagnosis. (v4.0.1.0) Thressa Sheller MD Pathologist, Electronic Signature (Case signed 03/16/2019) Specimen Alexandro Line and Clinical Information Specimen(s) Obtained: 1. Colon, segmental resection for tumor, sigmoid colon and descending colon open end proximal 2. Colon, resection margin (donut), final distal margin Specimen Clinical Information 1. colon cancer (kp) Kerrion Kemppainen 1. Specimen: Descending colon and sigmoid, open end is proximal. Specimen integrity: Intact. Specimen length: 28 cm. Mesorectal intactness: Not applicable. Tumor size: 4 cm in length, 4 cm in width, tan-red firm sessile mass with rolled edges. Percent of bowel circumference involved: 100%. Tumor distance to margins: Proximal: 7 cm. Distal: 17 cm. Mesenteric (sigmoid and transverse): 5 cm. Macroscopic extent of tumor invasion: On sectioning, the tumor mass is up to 1.6 cm thick, involving the entire thickness of the wall and into underlying fat. The tumor also abuts the serosa, with focal possible involvement of serosa. Total presumed lymph nodes: Found are fourteen possible lymph nodes ranging from 0.3 to 2.1 cm. The largest has  gray-white to yellow-red firm to friable cut surfaces. Extramural satellite tumor nodules: None. Mucosal polyp(s): None. Additional findings: None. Block summary: Thirteen blocks A =  proximal margin B = distal margin C = deepest tumor invasion into fat D-E = tumor mass with possible serosal involvement. F = tumor and adjacent mucosa G = tissue for molecular testing H = four nodes I = three nodes J = three nodes K = three nodes L-M = largest node. 2. In formalin is a 1.4 cm in length and 2.2 cm in diameter ring of tan-pink smooth to wrinkled mucosa with underlying embedded metallic staples in the wall. Representative sections in one block. (SSW:ah 03/13/19) 2 of 3 FINAL for [place], [place]al [id number]) Disclaimer Some of these immunohistochemical stains may have been developed and the performance characteristics determined by [facility]. Some may not have been cleared or approved by the U.S. Food and Drug Administration. The FDA has determined that such clearance or approval is not necessary. This test is used for clinical purposes. It should not be regarded as investigational or for research. This laboratory is certified under the [place] (CLIA-88) as qualified to perform high complexity clinical laboratory testing. Report signed out from the following location(s) Technical Component was performed at [facility] [place], [place], [place] [address]. CLIA #: [id number], Interpretation was performed at [facility] [place], [place], [place] [address]. CLIA #: [id number], 3 of 3   Result Notes for Surgical pathology  Notes recorded by Michael Boston, MD on 03/16/2019 at 12:11 PM EDT Patient s/p surgery for Descending colon cancer 03/11/2019 pT4a, pN1c Alisha, CCS CMA: 1. Please set the patient up for GI Tumor Board. Patient Care Team: Enid Skeens., MD as PCP - General  (Family Medicine) Ladell Pier, MD as Consulting Physician (Oncology) Jackquline Denmark, MD as Consulting Physician (Gastroenterology) Michael Boston, MD as Consulting Physician (General Surgery) 2. Please set up consult(s): Dr Benay Spice Diagnosis 1. Colon, segmental resection for tumor, sigmoid colon and descending colon open end proximal - ADENOCARCINOMA, MODERATELY DIFFERENTIATED, 4.0 CM - CARCINOMA FOCALLY INVOLVES SEROSA - ONE TUMOR DEPOSIT - LYMPHOVASCULAR SPACE INVASION PRESENT - NO CARCINOMA IDENTIFIED IN THIRTEEN LYMPH NODES (0/13) - SEE ONCOLOGY TABLE AND COMMENT BELOW 2. Colon, resection margin (donut), final distal margin - BENIGN COLON - NO MALIGNANCY IDENTIFIED Microscopic Comment 1. COLON AND RECTUM: Resection, Including Transanal Disk Excision of Rectal Neoplasms Procedure: Left hemicolectomy Tumor Site: Sigmoid Tumor Size: 4.0 cm Macroscopic Tumor Perforation: Not identified Histologic Type: Adenocarcinoma Histologic Grade: G2 (Moderately differentiated) Tumor Extension: Tumor invades the visceral peritoneum Margins: All margins uninvolved by invasive carcinoma, high grade dysplasia/intramucosal carcinoma, and low grade dysplasia Treatment effect: No known presurgical therapy Lymphovascular Invasion: Present Perineural Invasion: Not identified Tumor Deposits: Present, one Regional Lymph Nodes: Number of Lymph Nodes Involved: 13 Number of Lymph Nodes Examined: 0 Pathologic Stage Classification (pTNM, AJCC 8th Edition): pT4a, pN1c Ancillary Studies: Pending 1 of 3 FINAL for Awwad, [place] [id number]) Microscopic Comment(continued) Representative tumor block: 1C Comments: Dr. Vic Ripper reviewed the case and agrees with the above diagnosis. (v4.0.1.0) Thressa Sheller MD Pathologist, Electronic Signature (Case signed 03/16/2019) Specimen Staisha Winiarski and Clinical Information Specimen(s) Obtained: 1. Colon, segmental resection for tumor, sigmoid colon and descending  colon open end proximal 2. Colon, resection margin (donut), final distal margin Specimen Clinical Information 1. colon cancer (kp) [place] 1. Specimen: Descending colon and sigmoid, open end is proximal. Specimen integrity: Intact. Specimen length: 28 cm. Mesorectal intactness: Not applicable. Tumor size: 4 cm in length, 4 cm in width, tan-red firm sessile mass with rolled edges. Percent of bowel  circumference involved: 100%. Tumor distance to margins: Proximal: 7 cm. Distal: 17 cm. Mesenteric (sigmoid and transverse): 5 cm. Macroscopic extent of tumor invasion: On sectioning, the tumor mass is up to 1.6 cm thick, involving the entire thickness of the wall and into underlying fat. The tumor also abuts the serosa, with focal possible involvement of serosa. Total presumed lymph nodes: Found are fourteen possible lymph nodes ranging from 0.3 to 2.1 cm. The largest has gray-white to yellow-red firm to friable cut surfaces. Extramural satellite tumor nodules: None. Mucosal polyp(s): None. Additional findings: None. Block summary: Thirteen blocks A = proximal margin B = distal margin C = deepest tumor invasion into fat D-E = tumor mass with possible serosal involvement. F = tumor and adjacent mucosa G = tissue for molecular testing H = four nodes I = three nodes J = three nodes K = three nodes L-M = largest node. 2. In formalin is a 1.4 cm in length and 2.2 cm in diameter ring of tan-pink smooth to wrinkled mucosa with underlying embedded metallic staples in the wall. Representative sections in one block. (SSW:ah 03/13/19) 2 of 3 FINAL for [place], Sel[place] [id number]) Disclaimer Some of these immunohistochemical stains may have been developed and the performance characteristics determined by [facility]. Some may not have been cleared or approved by the U.S. Food and Drug Administration. The FDA has determined that such clearance or approval is not necessary. This test is  used for clinical purposes. It should not be regarded as investigational or for research. This laboratory is certified under the [place] (CLIA-88) as qualified to perform high complexity clinical laboratory testing. Report signed out from the following location(s) Technical Component was performed at [facility] [place], [place], [place] [address]. CLIA #: [id number], Interpretation was performed at [facility] [place], [place], [place] [address]. CLIA #: [id number], 3 of 3  ` ` `  03/11/2019  5:12 PM  PATIENT: Oscar Mccarty 53 y.o. male  Patient Care Team: Enid Skeens., MD as PCP - General (Family Medicine) Ladell Pier, MD as Consulting Physician (Oncology) Jackquline Denmark, MD as Consulting Physician (Gastroenterology) Michael Boston, MD as Consulting Physician (General Surgery)  PRE-OPERATIVE DIAGNOSIS: SIGMOID COLON CANCER  POST-OPERATIVE DIAGNOSIS: DISTAL DESCENDING COLON CANCER  PROCEDURE:  XI ROBOTIC ASSISTED PARTIAL COLECTOMY (DESCENDING & SIGMOID COLON) MOBILIZATION OF SPLENIC FLEXURE BILATERAL TAP BLOCK RIGID PROCTOSCOPY  SURGEON: [name], MD  ASSISTANT: Carlena Hurl, PA-C  ANESTHESIA: local and general  EBL: Total I/O In: 65 [I.V.:3000] Out: 200 [Urine:150; Blood:50]  Delay start of Pharmacological VTE agent (>24hrs) due to surgical blood loss or risk of bleeding: no  DRAINS: No  SPECIMEN: Descending and sigmoid colon. Open end proximal. Strongest tattooed near the obvious cancer  DISPOSITION OF SPECIMEN: PATHOLOGY  COUNTS: YES  PLAN OF CARE: Admit to inpatient  PATIENT DISPOSITION: PACU - hemodynamically stable.  INDICATION:   Pleasant gentleman developed worsening rectal bleeding. Found to have bulky tumor in upper sigmoid colon. Surgery recommended. He ended up developing COVID pneumonitis requiring hospitalization in  June. He is recovered and is now COVID negative. Because of his bleeding surgery moved up to this month. I recommended segmental resection:  The anatomy & physiology of the digestive tract was discussed. The pathophysiology was discussed. Natural history risks without surgery was discussed. I worked to give an overview of the disease and the frequent need to have multispecialty involvement. I feel the  risks of no intervention will lead to serious problems that outweigh the operative risks; therefore, I recommended a partial colectomy to remove the pathology. Laparoscopic & open techniques were discussed.  Risks such as bleeding, infection, abscess, leak, reoperation, possible ostomy, hernia, heart attack, death, and other risks were discussed. I noted a good likelihood this will help address the problem. Goals of post-operative recovery were discussed as well. We will work to minimize complications. Educational materials on the pathology had been given in the office. Questions were answered.   The patient expressed understanding & wished to proceed with surgery.  OR FINDINGS:  Patient had redundant descending and sigmoid colon with bulky tumor at the distal descending colon. Left conization done. Splenic flexure mobilization done to have tension-free reaching to the pelvis.  No obvious metastatic disease on visceral parietal peritoneum or liver.  The anastomosis rests 14 cm from the anal verge by rigid proctoscopy.  DESCRIPTION:  Informed consent was confirmed. The patient underwent general anaesthesia without difficulty. The patient was positioned appropriately. VTE prevention in place. The patient was clipped, prepped, & draped in a sterile fashion. Surgical timeout confirmed our plan.  The patient was positioned in reverse Trendelenburg. Abdominal entry was gained using Varess technique at the left subcostal ridge on the anterior abdominal wall. No elevated EtCO2  noted. Port placed. Camera inspection revealed no injury. Extra ports were carefully placed under direct laparoscopic visualization. Could see strongest tattoo at the distal descending colon. Sigmoid rather redundant and corkscrewed upon itself. I reflected the greater omentum and the upper abdomen the small bowel in the upper abdomen. The patient was carefully positioned. The Intuitive daVinci robot was docked with camera & instruments carefully placed.  I scored the base of peritoneum of the medial side of the mesentery of the elevated left colon from the ligament of Treitz to the peritoneal reflection of the mid rectum. I elevated the sigmoid mesentery and entered into the retro-mesenteric plane. We were able to identify the left ureter and gonadal vessels. We kept those posterior within the retroperitoneum and elevated the left colon mesentery off that. I did isolate the inferior mesenteric artery (IMA) pedicle but did not ligate it yet. I continued distally and got into the avascular plane posterior to the mesorectum. His tissues were somewhat fibrotic in areas and very stretched out/rubbery and others. Eventually I was able to mobilize the rectum as well by freeing the mesorectum off the sacrum. Identified the nervi ergentes days and kept them in the natural retroperitoneal midline position in its usual wishbone pattern. I mobilized the peritoneal coverings towards the peritoneal reflection on both the right and left sides down to the junction between the proximal and mid rectum. I stayed away from the right and left ureters. I kept the lateral vascular pedicles to the rectum intact.  I was able to come between the window between the inferior mesenteric vein and artery. Free off the mid and proximal colon off the kidney. Identify and preserve the ureter and gonadal vessels and keep them in the natural retroperitoneal position. I skeletonized the lymph nodes off the inferior mesenteric  artery pedicle. I went down to its takeoff from the aorta. I isolated the inferior mesenteric vein off of the ligament of Treitz just cephalad to that as well. After confirming the left ureter was out of the way, I went ahead and ligated the inferior mesenteric artery pedicle just near its takeoff from the aorta. I did ligate the inferior mesenteric vein in a  similar fashion. We ensured hemostasis. I skeletonized the mesorectum at the junction at the proximal rectum for the distal point of resection. I mobilized the left colon in a lateral to medial fashion off the line of Toldt up towards the splenic flexure to ensure good mobilization of the remaining left colon to reach into the pelvis. Unfortunately he did not seem to wish to mobilize well.  Therefore I decided to do more formal splenic flexure mobilization. I reposition the patient in reversal [place]. Transected the greater omentum off the mid transverse colon and follow that distally. Was able to get the lesser sac and free the great omentum towards the splenic flexure. I could see his pancreas and I was able to get a window between the inferior pancreatic edge and the retroperitoneum to connect with my prior retroperitoneal dissection. Without I transected the retroperitoneal attachments of the distal transverse colon and splenic flexure off the retroperitoneum especially to the kidney. Did that towards the midline. With that I got excellent mobilization of the entire left colon. That released the kinked up splenic flexure straighten it out and it came down much more easily off tension. Repositioned the patient in Trendelenburg.  I skeletonized at the proximal mesorectum and transected at the proximal rectum using a robotic 45 mm stapler. I chose a region at the proximal descending colon close to the splenic flexure that was soft and easily reached down to the rectal stump. I transected the mesentery of the colon radially to preserve  remaining colon blood supply, ensuring that the inferior mesenteric vein and arterial pedicles were with the specimen. Preserving a good marginal artery.  I created an extraction incision through a small Pfannenstiel incision in the suprapubic region. Placed a wound protector. I was able to eviscerate the rectosigmoid and descending colon out the wound. I clamped the colon proximal to this area using a reusable pursestringer device. Passed a 2-0 Keith needle. I transected at the descending/sigmoid junction with a scalpel. I got healthy bleeding mucosa. We sent the rectosigmoid colon specimen off to go to pathology. We sized the colon orifice. I chose a 31 EEA anvil stapler system. I reinforced the prolene pursestring with interrupted silk suture. I placed the anvil to the open end of the proximal remaining colon and closed around it using the pursestring. We did copious irrigation with crystalloid solution. Hemostasis was good. The distal end of the remaining colon easily reached down to the rectal stump, therefore, further colone mobilization was not needed.   Ms Lemar Lofty scrubbed down and did gentle anal dilation and advanced the EEA stapler up the rectal stump. The spike was brought out at the provimal end of the rectal stump under direct visualization. I attached the anvil of the proximal colon the spike of the stapler. Anvil was tightened down and held clamped for 60 seconds. The EEA stapler was fired and held clamped for 30 seconds. The stapler was released & removed. We noted 2 excellent anastomotic rings. Blue stitch is in the proximal ring. Ms Lemar Lofty We did a final irrigation of antibiotic solution (900 mg clindamycin/240 mg gentamicin in a liter of crystalloid) & held that for the pelvic air leak test . The rectum was insufflated the rectum while clamping the colon proximal to that anastomosis. There was a negative air leak test. There was no tension of mesentery or bowel at the  anastomosis. Tissues looked viable. Ureters & bowel uninjured. The anastomosis looked healthy.  Endoluminal gas was evacuated. Ports & wound protector  removed. I did rigid proctoscopy noted the anastomosis was at 14 cm from the anal verge consistent with the proximal rectum. We changed gloves & redraped the patient per colon SSI prevention protocol. We aspirated the antibiotic irrigation. Hemostasis was good. Sterile unused instruments were used from this point. I closed the skin at the port sites using Monocryl stitch and sterile dressing. I closed the extraction wound using a 0 Vicryl vertical peritoneal closure and a #1 PDS transverse anterior rectal fascial closure like a small Pfannenstiel closure. I closed the skin at the port sites and Pfannenstiel incision with 4-0 Monocryl stitches.   Patient is being extubated go to recovery room. I had discussed postop care with the patient in detail the office & in the holding area. Instructions are written. I discussed operative findings, updated the patient's status, discussed probable steps to recovery, and gave postoperative recommendations to the patient's significant other. Myrtie Soman. Recommendations were made. Questions were answered. She expressed understanding & appreciation.  [name], M.D., F.A.C.S. Gastrointestinal and Minimally Invasive Surgery [facility] [address], [place], [place] [address] [phone] Main / Paging   Allergies Sallyanne Kuster, [place]; 04/01/2019 4:30 PM) No Known Drug Allergies [02/02/2019]: (Marked as Inactive) Allergies Reconciled  Medication History Sallyanne Kuster, CMA; 04/01/2019 4:33 PM) metFORMIN HCl (500MG  Tablet, Oral) Active. Ibuprofen (400MG  Tablet, Oral) Active. midazolam Active. Medications Reconciled    Vitals Sallyanne Kuster CMA; 04/01/2019 4:34 PM) 04/01/2019 4:33 PM Weight: 164.38 lb Height: 67in Body Surface Area: 1.86 m Body Mass  Index: 25.74 kg/m  Temp.: 97.27F  Pulse: 77 (Regular)  P.OX: 98% (Room air) BP: 110/75 (Sitting, Right Arm, Standard)        Physical Exam [name] MD; 04/01/2019 4:42 PM)  General Mental Status-Alert. General Appearance-Not in acute distress. Voice-Normal. Note: Relaxed. Nontoxic.  Integumentary Global Assessment Upon inspection and palpation of skin surfaces of the - Distribution of scalp and body hair is normal. General Characteristics Overall examination of the patient's skin reveals - no rashes and no suspicious lesions.  Head and Neck Head-normocephalic, atraumatic with no lesions or palpable masses. Face Global Assessment - atraumatic, no absence of expression. Neck Global Assessment - no abnormal movements, no decreased range of motion. Trachea-midline. Thyroid Gland Characteristics - non-tender.  Eye Eyeball - Left-Extraocular movements intact, No Nystagmus - Left. Eyeball - Right-Extraocular movements intact, No Nystagmus - Right. Upper Eyelid - Left-No Cyanotic - Left. Upper Eyelid - Right-No Cyanotic - Right.  Chest and Lung Exam Inspection Accessory muscles - No use of accessory muscles in breathing.  Abdomen Note: Incisions with normal healing ridges. No active bleeding. No cellulitis. No guarding/rebound tenderness  Peripheral Vascular Upper Extremity Inspection - Left - Not Gangrenous, No Petechiae. Inspection - Right - Not Gangrenous, No Petechiae.  Neurologic Neurologic evaluation reveals -normal attention span and ability to concentrate, able to name objects and repeat phrases. Appropriate fund of knowledge and normal coordination.  Neuropsychiatric Mental status exam performed with findings of-able to articulate well with normal speech/language, rate, volume and coherence and no evidence of hallucinations, delusions, obsessions or homicidal/suicidal ideation. Orientation-oriented  X3.  Musculoskeletal Global Assessment Gait and Station - normal gait and station.  Lymphatic General Lymphatics Description - No Generalized lymphadenopathy.    Assessment & Plan [name] MD; 04/01/2019 5:01 PM)  CANCER OF DESCENDING COLON (C18.6) Impression: Recovering relatively well status post robotic left hemicolectomy with splenic flexure mobilization for T4 N1 descending colon cancer.  He  is lymph node positive. It is felt he benefit from post-adjuvant chemotherapy. We're due to place a port in next week. We discussed the technique. He had questions about when to show up. We will have preop area calling clarify this with him. For some reason at some length that [place] did. It is not realistic for him to go back unrestricted only 2 weeks from colon surgery. A little more time to recover. He'll transition the chemotherapy quite quickly. Cancer center will assume FMLA once that starts later this month.  Current Plans Use of a central venous catheter for intravenous therapy was discussed. Technique of catheter placement using ultrasound and fluoroscopy guidance was discussed. Risks such as bleeding, infection, pneumothorax, catheter occlusion, reoperation, and other risks were discussed. I noted a good likelihood this will help address the problem. Questions were answered. The patient expressed understanding & wishes to proceed. Started traMADol HCl 50 MG Oral Tablet, 1-2 Tablet every six hours, as needed, #30, 04/01/2019, No Refill.  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  Soreness, decreased appetite, and poor energy level are common problems after surgery. While many people can struggle with a bad day, these concerns should gradually fade away or at least improve. Much of your recovery depends on your health & the severity of your operation. Please call if you have any further questions / concerns related to  surgery.  Increase activity as tolerated to regular everyday activity. Consider daily low impact exercise every day such as walking an hour a day.  Do not push through pain. If it hurts to do it, then don't do it.  Diet as tolerated. Low fat high fiber diet ideal. 30 g fiber a day ideal. Consider taking a daily fiber supplement to keep your bowels regular.  Followup with your primary care physician for other health issues as would normally be done.  Consider screening for malignancies (breast, prostate, colon, melanoma, etc) as appropriate. Discuss with you primary care physician.  Consider follow up colonoscopy by your gastroenterologist.  Since you had a colorectal cancer resected by surgery, you should strongly consider getting a colonoscopy by your gastroenterologict in one year after the surgery that removed your cancer. Call your gastroenterologist for advice  Pt Education - [facility] Colorectal Cancer (AT): discussed with patient and provided information. Pt Education - [facility] Good Bowel Health (Vince Ainsley)  [name], MD, FACS, MASCRS Gastrointestinal and Minimally Invasive Surgery    [address] [place], [place] [address] [phone] Main / Paging [phone] Fax

## 2019-04-03 ENCOUNTER — Encounter: Payer: Self-pay | Admitting: *Deleted

## 2019-04-03 NOTE — Progress Notes (Signed)
Received Attending Physician statement, Return to work form and signed ROI from [place]. Placed in pick up box for managed care to process.

## 2019-04-06 ENCOUNTER — Other Ambulatory Visit (HOSPITAL_COMMUNITY)
Admission: RE | Admit: 2019-04-06 | Discharge: 2019-04-06 | Disposition: A | Discharge status: Home / Self Care | Payer: BC Managed Care – PPO | Source: Ambulatory Visit | Attending: Surgery | Admitting: Surgery

## 2019-04-06 DIAGNOSIS — Z20828 Contact with and (suspected) exposure to other viral communicable diseases: Secondary | ICD-10-CM | POA: Insufficient documentation

## 2019-04-06 DIAGNOSIS — C189 Malignant neoplasm of colon, unspecified: Secondary | ICD-10-CM | POA: Diagnosis not present

## 2019-04-06 DIAGNOSIS — Z01812 Encounter for preprocedural laboratory examination: Secondary | ICD-10-CM | POA: Insufficient documentation

## 2019-04-06 NOTE — Patient Instructions (Signed)
DUE TO COVID-19 ONLY ONE VISITOR IS ALLOWED TO COME WITH YOU AND STAY IN THE WAITING ROOM ONLY DURING PRE OP AND PROCEDURE DAY OF SURGERY. THE 1 VISITOR MAY VISIT WITH YOU AFTER SURGERY IN YOUR PRIVATE ROOM DURING VISITING HOURS ONLY!  ) ONCE YOUR COVID TEST IS COMPLETED, PLEASE BEGIN THE QUARANTINE INSTRUCTIONS AS OUTLINED IN YOUR HANDOUT.                [facility]    Your procedure is scheduled on: Thursday 04/09/19   Report to [facility] Main  Entrance   Report to admitting at  1:00 PM     Call this number if you have problems the morning of surgery [phone]    Remember: Do not eat food or drink liquids :After Midnight.  BRUSH YOUR TEETH MORNING OF SURGERY AND RINSE YOUR MOUTH OUT, NO CHEWING GUM CANDY OR MINTS.     Take these medicines the morning of surgery with A SIP OF WATER: none  DO NOT TAKE ANY DIABETIC MEDICATIONS DAY OF YOUR SURGERY      How to Manage Your Diabetes Before and After Surgery  Why is it important to control my blood sugar before and after surgery? . Improving blood sugar levels before and after surgery helps healing and can limit problems. . A way of improving blood sugar control is eating a healthy diet by: o  Eating less sugar and carbohydrates o  Increasing activity/exercise o  Talking with your doctor about reaching your blood sugar goals . High blood sugars (greater than 180 mg/dL) can raise your risk of infections and slow your recovery, so you will need to focus on controlling your diabetes during the weeks before surgery. . Make sure that the doctor who takes care of your diabetes knows about your planned surgery including the date and location.  How do I manage my blood sugar before surgery? . Check your blood sugar at least 4 times a day, starting 2 days before surgery, to make sure that the level is not too high or low. o Check your blood sugar the morning of your surgery when you wake up and every 2 hours until you get to the  Short Stay unit. . If your blood sugar is less than 70 mg/dL, you will need to treat for low blood sugar: o Do not take insulin. o Treat a low blood sugar (less than 70 mg/dL) with  cup of clear juice (cranberry or apple), 4 glucose tablets, OR glucose gel. o Recheck blood sugar in 15 minutes after treatment (to make sure it is greater than 70 mg/dL). If your blood sugar is not greater than 70 mg/dL on recheck, call [phone] for further instructions. . Report your blood sugar to the short stay nurse when you get to Short Stay.  . If you are admitted to the hospital after surgery: o Your blood sugar will be checked by the staff and you will probably be given insulin after surgery (instead of oral diabetes medicines) to make sure you have good blood sugar levels. o The goal for blood sugar control after surgery is 80-180 mg/dL.   WHAT DO I DO ABOUT MY DIABETES MEDICATION?  Do not take oral diabetes medicines (pills) the morning of surgery.                          You may not have any metal on your body including piercings  Do not wear jewelry,  lotions, powders or  deodorant                       Men may shave face and neck.   Do not bring valuables to the hospital. [place].  Contacts, dentures or bridgework may not be worn into surgery.      Patients discharged the day of surgery will not be allowed to drive home.  IF YOU ARE HAVING SURGERY AND GOING HOME THE SAME DAY, YOU MUST HAVE AN ADULT TO DRIVE YOU HOME AND BE WITH YOU FOR 24 HOURS.  YOU MAY GO HOME BY TAXI OR UBER OR ORTHERWISE, BUT AN ADULT MUST ACCOMPANY YOU HOME AND STAY WITH YOU FOR 24 HOURS.  Name and phone number of your driver:  Special Instructions: N/A              Please read over the following fact sheets you were given: _____________________________________________________________________             [facility] - Preparing for  Surgery  Before surgery, you can play an important role.   Because skin is not sterile, your skin needs to be as free of germs as possible.   You can reduce the number of germs on your skin by washing with CHG (chlorahexidine gluconate) soap before surgery.   CHG is an antiseptic cleaner which kills germs and bonds with the skin to continue killing germs even after washing. Please DO NOT use if you have an allergy to CHG or antibacterial soaps.   If your skin becomes reddened/irritated stop using the CHG and inform your nurse when you arrive at Short Stay.   You may shave your face/neck. Please follow these instructions carefully:  1.  Shower with CHG Soap the night before surgery and the  morning of Surgery.  2.  If you choose to wash your hair, wash your hair first as usual with your  normal  shampoo.  3.  After you shampoo, rinse your hair and body thoroughly to remove the  shampoo.                                        4.  Use CHG as you would any other liquid soap.  You can apply chg directly  to the skin and wash                       Gently with a scrungie or clean washcloth.  5.  Apply the CHG Soap to your body ONLY FROM THE NECK DOWN.   Do not use on face/ open                           Wound or open sores. Avoid contact with eyes, ears mouth and genitals (private parts).                       Wash face,  Genitals (private parts) with your normal soap.             6.  Wash thoroughly, paying special attention to the area where your surgery  will be performed.  7.  Thoroughly rinse your body  with warm water from the neck down.  8.  DO NOT shower/wash with your normal soap after using and rinsing off  the CHG Soap.                9.  Pat yourself dry with a clean towel.            10.  Wear clean pajamas.            11.  Place clean sheets on your bed the night of your first shower and do not  sleep with pets. Day of Surgery : Do not apply any lotions/deodorants the morning of  surgery.  Please wear clean clothes to the hospital/surgery center.  FAILURE TO FOLLOW THESE INSTRUCTIONS MAY RESULT IN THE CANCELLATION OF YOUR SURGERY PATIENT SIGNATURE_________________________________  NURSE SIGNATURE__________________________________  ________________________________________________________________________

## 2019-04-07 ENCOUNTER — Encounter (HOSPITAL_COMMUNITY)
Admission: RE | Admit: 2019-04-07 | Discharge: 2019-04-07 | Disposition: A | Discharge status: Home / Self Care | Payer: BC Managed Care – PPO | Source: Ambulatory Visit | Attending: Surgery | Admitting: Surgery

## 2019-04-07 ENCOUNTER — Encounter (HOSPITAL_COMMUNITY): Payer: Self-pay

## 2019-04-07 ENCOUNTER — Other Ambulatory Visit: Payer: Self-pay

## 2019-04-07 DIAGNOSIS — Z01812 Encounter for preprocedural laboratory examination: Secondary | ICD-10-CM | POA: Insufficient documentation

## 2019-04-07 LAB — CBC
HCT: 40.8 % (ref 39.0–52.0)
Hemoglobin: 12.9 g/dL — ABNORMAL LOW (ref 13.0–17.0)
MCH: 26.5 pg (ref 26.0–34.0)
MCHC: 31.6 g/dL (ref 30.0–36.0)
MCV: 84 fL (ref 80.0–100.0)
Platelets: 188 10*3/uL (ref 150–400)
RBC: 4.86 MIL/uL (ref 4.22–5.81)
RDW: 12.4 % (ref 11.5–15.5)
WBC: 4.1 10*3/uL (ref 4.0–10.5)
nRBC: 0 % (ref 0.0–0.2)

## 2019-04-07 LAB — BASIC METABOLIC PANEL
Anion gap: 8 (ref 5–15)
BUN: 12 mg/dL (ref 6–20)
CO2: 25 mmol/L (ref 22–32)
Calcium: 8.9 mg/dL (ref 8.9–10.3)
Chloride: 106 mmol/L (ref 98–111)
Creatinine, Ser: 0.67 mg/dL (ref 0.61–1.24)
GFR calc Af Amer: >60 mL/min (ref >60)
GFR calc non Af Amer: >60 mL/min (ref >60)
Glucose, Bld: 118 mg/dL — ABNORMAL HIGH (ref 70–99)
Potassium: 4.2 mmol/L (ref 3.5–5.1)
Sodium: 139 mmol/L (ref 135–145)

## 2019-04-07 LAB — NOVEL CORONAVIRUS, NAA (HOSP ORDER, SEND-OUT TO REF LAB; TAT 18-24 HRS): SARS-CoV-2, NAA: NOT DETECTED

## 2019-04-07 LAB — GLUCOSE, CAPILLARY: Glucose-Capillary: 100 mg/dL — ABNORMAL HIGH (ref 70–99)

## 2019-04-07 NOTE — Progress Notes (Signed)
PCP -Dr. Wendie Agreste  Cardiologist - none  Chest x-ray - 01/05/19 EKG - 03/05/19 Stress Test - no ECHO - no Cardiac Cath -no  Sleep Study - no CPAP -   Fasting Blood Sugar - 90s-100 Checks Blood Sugar _1____ times a day  Blood Thinner Instructions:NA Aspirin Instructions: Last Dose:  Anesthesia review:   Patient denies shortness of breath, fever, cough and chest pain at PAT appointment yes  Patient verbalized understanding of instructions that were given to them at the PAT appointment. Patient was also instructed that they will need to review over the PAT instructions again at home before surgery. yes

## 2019-04-09 ENCOUNTER — Ambulatory Visit (HOSPITAL_COMMUNITY): Payer: BC Managed Care – PPO

## 2019-04-09 ENCOUNTER — Other Ambulatory Visit: Payer: Self-pay

## 2019-04-09 ENCOUNTER — Encounter (HOSPITAL_COMMUNITY)
Admission: RE | Disposition: A | Discharge status: Home / Self Care | Payer: Self-pay | Source: Home / Self Care | Attending: Surgery

## 2019-04-09 ENCOUNTER — Ambulatory Visit (HOSPITAL_COMMUNITY): Payer: BC Managed Care – PPO | Admitting: Physician Assistant

## 2019-04-09 ENCOUNTER — Encounter (HOSPITAL_COMMUNITY): Payer: Self-pay | Admitting: *Deleted

## 2019-04-09 ENCOUNTER — Ambulatory Visit (HOSPITAL_COMMUNITY): Payer: BC Managed Care – PPO | Admitting: Certified Registered Nurse Anesthetist

## 2019-04-09 ENCOUNTER — Ambulatory Visit (HOSPITAL_COMMUNITY)
Admission: RE | Admit: 2019-04-09 | Discharge: 2019-04-09 | Disposition: A | Discharge status: Home / Self Care | Payer: BC Managed Care – PPO | Attending: Surgery | Admitting: Surgery

## 2019-04-09 DIAGNOSIS — Z8619 Personal history of other infectious and parasitic diseases: Secondary | ICD-10-CM | POA: Diagnosis not present

## 2019-04-09 DIAGNOSIS — E119 Type 2 diabetes mellitus without complications: Secondary | ICD-10-CM | POA: Diagnosis not present

## 2019-04-09 DIAGNOSIS — C186 Malignant neoplasm of descending colon: Secondary | ICD-10-CM | POA: Insufficient documentation

## 2019-04-09 DIAGNOSIS — C187 Malignant neoplasm of sigmoid colon: Secondary | ICD-10-CM | POA: Diagnosis present

## 2019-04-09 DIAGNOSIS — Z79899 Other long term (current) drug therapy: Secondary | ICD-10-CM | POA: Insufficient documentation

## 2019-04-09 DIAGNOSIS — Z7984 Long term (current) use of oral hypoglycemic drugs: Secondary | ICD-10-CM | POA: Diagnosis not present

## 2019-04-09 DIAGNOSIS — Z789 Other specified health status: Secondary | ICD-10-CM

## 2019-04-09 HISTORY — PX: PORTACATH PLACEMENT: SHX2246

## 2019-04-09 LAB — GLUCOSE, CAPILLARY
Glucose-Capillary: 103 mg/dL — ABNORMAL HIGH (ref 70–99)
Glucose-Capillary: 115 mg/dL — ABNORMAL HIGH (ref 70–99)

## 2019-04-09 SURGERY — INSERTION, TUNNELED CENTRAL VENOUS DEVICE, WITH PORT
Anesthesia: General | Site: Chest

## 2019-04-09 MED ORDER — BUPIVACAINE HCL (PF) 0.25 % IJ SOLN
INTRAMUSCULAR | Status: AC
  Filled 2019-04-09: qty 30

## 2019-04-09 MED ORDER — CEFAZOLIN SODIUM-DEXTROSE 2-4 GM/100ML-% IV SOLN
2.0000 g | INTRAVENOUS | Status: AC
  Administered 2019-04-09: 16:00:00 2 g via INTRAVENOUS
  Filled 2019-04-09: qty 100

## 2019-04-09 MED ORDER — DEXAMETHASONE SODIUM PHOSPHATE 10 MG/ML IJ SOLN
INTRAMUSCULAR | Status: AC
  Filled 2019-04-09: qty 1

## 2019-04-09 MED ORDER — GABAPENTIN 300 MG PO CAPS: 300.0000 mg | ORAL_CAPSULE | ORAL | Status: DC

## 2019-04-09 MED ORDER — FENTANYL CITRATE (PF) 100 MCG/2ML IJ SOLN
INTRAMUSCULAR | Status: DC | PRN
  Administered 2019-04-09 (×4): 25 ug via INTRAVENOUS

## 2019-04-09 MED ORDER — PROPOFOL 10 MG/ML IV BOLUS
INTRAVENOUS | Status: AC
  Filled 2019-04-09: qty 20

## 2019-04-09 MED ORDER — SODIUM CHLORIDE 0.9 % IV SOLN
Freq: Once | INTRAVENOUS | Status: AC
  Administered 2019-04-09: 6 mL
  Filled 2019-04-09: qty 1.2

## 2019-04-09 MED ORDER — ACETAMINOPHEN 500 MG PO TABS
1000.0000 mg | ORAL_TABLET | ORAL | Status: AC
  Administered 2019-04-09: 14:00:00 1000 mg via ORAL
  Filled 2019-04-09: qty 2

## 2019-04-09 MED ORDER — CHLORHEXIDINE GLUCONATE CLOTH 2 % EX PADS: 6.0000 | MEDICATED_PAD | Freq: Once | CUTANEOUS | Status: DC

## 2019-04-09 MED ORDER — LIDOCAINE 2% (20 MG/ML) 5 ML SYRINGE
INTRAMUSCULAR | Status: DC | PRN
  Administered 2019-04-09: 80 mg via INTRAVENOUS

## 2019-04-09 MED ORDER — HEPARIN SOD (PORK) LOCK FLUSH 100 UNIT/ML IV SOLN
INTRAVENOUS | Status: AC
  Filled 2019-04-09: qty 5

## 2019-04-09 MED ORDER — BUPIVACAINE-EPINEPHRINE 0.5% -1:200000 IJ SOLN
INTRAMUSCULAR | Status: DC | PRN
  Administered 2019-04-09: 20 mL

## 2019-04-09 MED ORDER — LACTATED RINGERS IV SOLN
INTRAVENOUS | Status: DC
  Administered 2019-04-09: 14:00:00 via INTRAVENOUS

## 2019-04-09 MED ORDER — HEPARIN SOD (PORK) LOCK FLUSH 100 UNIT/ML IV SOLN
INTRAVENOUS | Status: DC | PRN
  Administered 2019-04-09: 500 [IU]

## 2019-04-09 MED ORDER — FENTANYL CITRATE (PF) 100 MCG/2ML IJ SOLN
INTRAMUSCULAR | Status: AC
  Filled 2019-04-09: qty 2

## 2019-04-09 MED ORDER — ONDANSETRON HCL 4 MG/2ML IJ SOLN
INTRAMUSCULAR | Status: DC | PRN
  Administered 2019-04-09: 4 mg via INTRAVENOUS

## 2019-04-09 MED ORDER — PROMETHAZINE HCL 25 MG/ML IJ SOLN: 6.2500 mg | INTRAMUSCULAR | Status: DC | PRN

## 2019-04-09 MED ORDER — CELECOXIB 200 MG PO CAPS
200.0000 mg | ORAL_CAPSULE | ORAL | Status: AC
  Administered 2019-04-09: 14:00:00 200 mg via ORAL
  Filled 2019-04-09: qty 1

## 2019-04-09 MED ORDER — MIDAZOLAM HCL 2 MG/2ML IJ SOLN
INTRAMUSCULAR | Status: AC
  Filled 2019-04-09: qty 2

## 2019-04-09 MED ORDER — FENTANYL CITRATE (PF) 100 MCG/2ML IJ SOLN: 25.0000 ug | INTRAMUSCULAR | Status: DC | PRN

## 2019-04-09 MED ORDER — BUPIVACAINE-EPINEPHRINE 0.5% -1:200000 IJ SOLN
INTRAMUSCULAR | Status: AC
  Filled 2019-04-09: qty 1

## 2019-04-09 MED ORDER — MIDAZOLAM HCL 5 MG/5ML IJ SOLN
INTRAMUSCULAR | Status: DC | PRN
  Administered 2019-04-09: 2 mg via INTRAVENOUS

## 2019-04-09 MED ORDER — 0.9 % SODIUM CHLORIDE (POUR BTL) OPTIME
TOPICAL | Status: DC | PRN
  Administered 2019-04-09: 1000 mL

## 2019-04-09 MED ORDER — DEXAMETHASONE SODIUM PHOSPHATE 10 MG/ML IJ SOLN
INTRAMUSCULAR | Status: DC | PRN
  Administered 2019-04-09: 4 mg via INTRAVENOUS

## 2019-04-09 MED ORDER — PHENYLEPHRINE 40 MCG/ML (10ML) SYRINGE FOR IV PUSH (FOR BLOOD PRESSURE SUPPORT)
PREFILLED_SYRINGE | INTRAVENOUS | Status: AC
  Filled 2019-04-09: qty 10

## 2019-04-09 MED ORDER — ONDANSETRON HCL 4 MG/2ML IJ SOLN
INTRAMUSCULAR | Status: AC
  Filled 2019-04-09: qty 2

## 2019-04-09 MED ORDER — LIDOCAINE 2% (20 MG/ML) 5 ML SYRINGE
INTRAMUSCULAR | Status: AC
  Filled 2019-04-09: qty 5

## 2019-04-09 MED ORDER — PHENYLEPHRINE 40 MCG/ML (10ML) SYRINGE FOR IV PUSH (FOR BLOOD PRESSURE SUPPORT)
PREFILLED_SYRINGE | INTRAVENOUS | Status: DC | PRN
  Administered 2019-04-09: 80 ug via INTRAVENOUS

## 2019-04-09 MED ORDER — PROPOFOL 10 MG/ML IV BOLUS
INTRAVENOUS | Status: DC | PRN
  Administered 2019-04-09: 200 mg via INTRAVENOUS

## 2019-04-09 SURGICAL SUPPLY — 30 items
BAG DECANTER FOR FLEXI CONT (MISCELLANEOUS) ×3 IMPLANT
BLADE SURG SZ11 CARB STEEL (BLADE) ×3 IMPLANT
CHLORAPREP W/TINT 26 (MISCELLANEOUS) ×3 IMPLANT
COVER PROBE U/S 5X48 (MISCELLANEOUS) ×3 IMPLANT
COVER SURGICAL LIGHT HANDLE (MISCELLANEOUS) ×3 IMPLANT
COVER WAND RF STERILE (DRAPES) IMPLANT
DECANTER SPIKE VIAL GLASS SM (MISCELLANEOUS) ×3 IMPLANT
DRAPE C-ARM 42X120 X-RAY (DRAPES) ×3 IMPLANT
DRAPE LAPAROTOMY T 98X78 PEDS (DRAPES) ×3 IMPLANT
DRSG TEGADERM 2-3/8X2-3/4 SM (GAUZE/BANDAGES/DRESSINGS) ×2 IMPLANT
DRSG TEGADERM 4X4.75 (GAUZE/BANDAGES/DRESSINGS) ×2 IMPLANT
ELECT PENCIL ROCKER SW 15FT (MISCELLANEOUS) ×3 IMPLANT
ELECT REM PT RETURN 15FT ADLT (MISCELLANEOUS) ×3 IMPLANT
GAUZE 4X4 16PLY RFD (DISPOSABLE) ×3 IMPLANT
GAUZE SPONGE 2X2 8PLY STRL LF (GAUZE/BANDAGES/DRESSINGS) ×1 IMPLANT
GLOVE ECLIPSE 8.0 STRL XLNG CF (GLOVE) ×3 IMPLANT
GLOVE INDICATOR 8.0 STRL GRN (GLOVE) ×3 IMPLANT
GOWN STRL REUS W/TWL XL LVL3 (GOWN DISPOSABLE) ×6 IMPLANT
KIT BASIN OR (CUSTOM PROCEDURE TRAY) ×3 IMPLANT
KIT PORT POWER 8FR ISP CVUE (Port) ×2 IMPLANT
KIT TURNOVER KIT A (KITS) IMPLANT
NEEDLE HYPO 22GX1.5 SAFETY (NEEDLE) ×3 IMPLANT
PACK BASIC VI WITH GOWN DISP (CUSTOM PROCEDURE TRAY) ×3 IMPLANT
SPONGE GAUZE 2X2 STER 10/PKG (GAUZE/BANDAGES/DRESSINGS) ×2
SUT MNCRL AB 4-0 PS2 18 (SUTURE) ×3 IMPLANT
SUT PROLENE 2 0 SH DA (SUTURE) ×6 IMPLANT
SYR 10ML LL (SYRINGE) ×3 IMPLANT
SYR 20ML LL LF (SYRINGE) ×3 IMPLANT
TOWEL OR 17X26 10 PK STRL BLUE (TOWEL DISPOSABLE) ×3 IMPLANT
TOWEL OR NON WOVEN STRL DISP B (DISPOSABLE) ×3 IMPLANT

## 2019-04-09 NOTE — Interval H&P Note (Signed)
History and Physical Interval Note:  04/09/2019 3:08 PM  [facility]  has presented today for surgery, with the diagnosis of COLON CANCER, NEED FOR CHEMOTHERAPY.  The various methods of treatment have been discussed with the patient and family. After consideration of risks, benefits and other options for treatment, the patient has consented to  Procedure(s): PLACEMENT OF PORT-A-CATHETER UNDER ULTRASOUND AND FLUOROSCOPIC GUIDANCE (N/A) as a surgical intervention.  The patient's history has been reviewed, patient examined, no change in status, stable for surgery.  I have reviewed the patient's chart and labs.  Questions were answered to the patient's satisfaction.    I have re-reviewed the the patient's records, history, medications, and allergies.  I have re-examined the patient.  I again discussed intraoperative plans and goals of post-operative recovery.  The patient agrees to proceed.  Oscar Mccarty  Nov 23, 1965 [id number]  Patient Care Team: Enid Skeens., MD as PCP - General (Family Medicine) Ladell Pier, MD as Consulting Physician (Oncology) Jackquline Denmark, MD as Consulting Physician (Gastroenterology) Michael Boston, MD as Consulting Physician (General Surgery)  Patient Active Problem List   Diagnosis Date Noted  . History of COVID-19 respiratory pneumonitis June 2020 03/11/2019    Priority: High  . Cancer of sigmoid colon ([place]) 03/11/2019  . Acute respiratory disease due to COVID-19 virus - June 2020 01/05/2019  . Diabetes mellitus, type 2 ([place]) 12/25/2018  . Adenocarcinoma of sigmoid colon ([place]) 12/22/2018  . Bronchitis 09/17/2011  . Allergic rhinitis 12/12/2010  . Chronic headaches 12/12/2010  . Depression 12/12/2010  . Cough 12/12/2010    Past Medical History:  Diagnosis Date  . Acute respiratory disease due to COVID-19 virus - June 2020 01/05/2019  . Adenocarcinoma of sigmoid colon ([place]) 12/22/2018   Malignant partially obstructing tumor in the proximal sigmoid colon   . Allergic rhinitis   . Chronic headaches   . Depression   . Diabetes mellitus, type 2 ([place]) 12/25/2018   new diagnosis, A1C 9.2% 12/25/2018  . GERD (gastroesophageal reflux disease)   . History of COVID-19 respiratory pneumonitis June 2020 03/11/2019    Past Surgical History:  Procedure Laterality Date  . COLONOSCOPY    . no surgical history    . PROCTOSCOPY N/A 03/11/2019   Procedure: RIGID PROCTOSCOPY;  Surgeon: Michael Boston, MD;  Location: [facility] ORS;  Service: General;  Laterality: N/A;  . XI ROBOTIC ASSISTED COLOSTOMY TAKEDOWN N/A 03/11/2019   Procedure: XI ROBOTIC ASSISTED LEFT COLECTOMY, MOBILIZATION OF SPLENIC FLEXURE, BILATERAL TAP BLOCK;  Surgeon: Michael Boston, MD;  Location: [facility]S;  Service: General;  Laterality: N/A;    Social History   Socioeconomic History  . Marital status: Married    Spouse name: Not on file  . Number of children: Not on file  . Years of education: Not on file  . Highest education level: Not on file  Occupational History  . Not on file  Social Needs  . Financial resource strain: Not on file  . Food insecurity    Worry: Not on file    Inability: Not on file  . Transportation needs    Medical: Not on file    Non-medical: Not on file  Tobacco Use  . Smoking status: Never Smoker  . Smokeless tobacco: Never Used  Substance and Sexual Activity  . Alcohol use: Not Currently    Comment: occ  . Drug use: No  . Sexual activity: Not on file  Lifestyle  . Physical activity    Days per week: Not on  file    Minutes per session: Not on file  . Stress: Not on file  Relationships  . Social Herbalist on phone: Not on file    Gets together: Not on file    Attends religious service: Not on file    Active member of club or organization: Not on file    Attends meetings of clubs or organizations: Not on file    Relationship status: Not on file  . Intimate partner violence    Fear of current or ex partner: Not on file    Emotionally abused:  Not on file    Physically abused: Not on file    Forced sexual activity: Not on file  Other Topics Concern  . Not on file  Social History Narrative  . Not on file    Family History  Problem Relation Age of Onset  . Colon cancer Neg Hx   . Esophageal cancer Neg Hx   . Rectal cancer Neg Hx   . Stomach cancer Neg Hx     Medications Prior to Admission  Medication Sig Dispense Refill Last Dose  . ibuprofen (ADVIL) 200 MG tablet Take 400 mg by mouth every 6 (six) hours as needed for headache or moderate pain.   Past Week at Unknown time  . metFORMIN (GLUCOPHAGE) 500 MG tablet Take 1 tablet (500 mg total) by mouth 2 (two) times daily with a meal. 60 tablet 11 Past Month at Unknown time  . lidocaine-prilocaine (EMLA) cream Apply 1 application topically as needed. 30 g 0 Unknown at Unknown time  . prochlorperazine (COMPAZINE) 10 MG tablet Take 1 tablet (10 mg total) by mouth every 6 (six) hours as needed for nausea or vomiting. 30 tablet 0 Unknown at Unknown time  . traMADol (ULTRAM) 50 MG tablet Take 1-2 tablets (50-100 mg total) by mouth every 6 (six) hours as needed for moderate pain or severe pain. 20 tablet 0 Unknown at Unknown time    Current Facility-Administered Medications  Medication Dose Route Frequency Provider Last Rate Last Dose  . ceFAZolin (ANCEF) IVPB 2g/100 mL premix  2 g Intravenous On Call to OR Michael Boston, MD      . Chlorhexidine Gluconate Cloth 2 % PADS 6 each  6 each Topical Once Michael Boston, MD       And  . Chlorhexidine Gluconate Cloth 2 % PADS 6 each  6 each Topical Once Michael Boston, MD      . gabapentin (NEURONTIN) capsule 300 mg  300 mg Oral On Call to OR Michael Boston, MD      . lactated ringers infusion   Intravenous Continuous Suzette Battiest, MD 20 mL/hr at 04/09/19 1340       No Known Allergies  BP 129/84   Pulse 63   Temp 98.4 F (36.9 C) (Oral)   Resp 16   Ht 5\' 7"  (1.702 m)   Wt 74 kg   SpO2 100%   BMI 25.55 kg/m   Labs: Results  for orders placed or performed during the hospital encounter of 04/09/19 (from the past 48 hour(s))  Glucose, capillary     Status: Abnormal   Collection Time: 04/09/19  1:18 PM  Result Value Ref Range   Glucose-Capillary 103 (H) 70 - 99 mg/dL   Comment 1 Notify RN    Comment 2 Document in Chart     Imaging / Studies: No results found.   [name], M.D., F.A.C.S. Gastrointestinal and Minimally Invasive Surgery  [facility] [phone] [address], [place], [place] [address] [phone] Main / Paging  04/09/2019 3:08 PM    [name]

## 2019-04-09 NOTE — Transfer of Care (Signed)
Immediate Anesthesia Transfer of Care Note  Patient: Oscar Mccarty  Procedure(s) Performed: PLACEMENT OF PORT-A-CATHETER UNDER ULTRASOUND AND FLUOROSCOPIC GUIDANCE (N/A Chest)  Patient Location: PACU  Anesthesia Type:General  Level of Consciousness: awake, alert  and patient cooperative  Airway & Oxygen Therapy: Patient Spontanous Breathing and Patient connected to face mask oxygen  Post-op Assessment: Report given to RN and Post -op Vital signs reviewed and stable  Post vital signs: Reviewed and stable  Last Vitals:  Vitals Value Taken Time  BP 145/81 04/09/19 1703  Temp    Pulse 77 04/09/19 1705  Resp 11 04/09/19 1705  SpO2 100 % 04/09/19 1705  Vitals shown include unvalidated device data.  Last Pain:  Vitals:   04/09/19 1315  TempSrc: Oral      Patients Stated Pain Goal: 4 ([id number])  Complications: No apparent anesthesia complications

## 2019-04-09 NOTE — Anesthesia Postprocedure Evaluation (Signed)
Anesthesia Post Note  Patient: Oscar Mccarty  Procedure(s) Performed: PLACEMENT OF PORT-A-CATHETER UNDER ULTRASOUND AND FLUOROSCOPIC GUIDANCE (N/A Chest)     Patient location during evaluation: PACU Anesthesia Type: General Level of consciousness: awake and alert Pain management: pain level controlled Vital Signs Assessment: post-procedure vital signs reviewed and stable Respiratory status: spontaneous breathing, nonlabored ventilation, respiratory function stable and patient connected to nasal cannula oxygen Cardiovascular status: blood pressure returned to baseline and stable Postop Assessment: no apparent nausea or vomiting Anesthetic complications: no    Last Vitals:  Vitals:   04/09/19 1720 04/09/19 1730  BP: 131/75 134/80  Pulse: 67 68  Resp: 13 14  Temp: (!) 36.3 C 36.4 C  SpO2: 100% 100%    Last Pain:  Vitals:   04/09/19 1730  TempSrc:   PainSc: 0-No pain                 Tiajuana Amass

## 2019-04-09 NOTE — Anesthesia Procedure Notes (Signed)
Procedure Name: LMA Insertion Date/Time: 04/09/2019 4:04 PM Performed by: Mitzie Na, CRNA Pre-anesthesia Checklist: Patient identified, Emergency Drugs available, Suction available, Patient being monitored and Timeout performed Patient Re-evaluated:Patient Re-evaluated prior to induction Oxygen Delivery Method: Circle system utilized Preoxygenation: Pre-oxygenation with 100% oxygen Induction Type: IV induction LMA: LMA inserted LMA Size: 4.0 Number of attempts: 1 Placement Confirmation: positive ETCO2 and breath sounds checked- equal and bilateral Tube secured with: Tape Dental Injury: Teeth and Oropharynx as per pre-operative assessment

## 2019-04-09 NOTE — Anesthesia Preprocedure Evaluation (Addendum)
Anesthesia Evaluation  Patient identified by MRN, date of birth, ID band Patient awake    Reviewed: Allergy & Precautions, NPO status , Patient's Chart, lab work & pertinent test results  Airway Mallampati: II  TM Distance: >3 FB Neck ROM: Full    Dental  (+) Dental Advisory Given   Pulmonary neg pulmonary ROS,  Hx covid respiratory illness 12/2018   breath sounds clear to auscultation       Cardiovascular  Rhythm:Regular Rate:Normal     Neuro/Psych  Headaches,    GI/Hepatic Neg liver ROS, GERD  ,Colon CA   Endo/Other  diabetes, Type 2, Oral Hypoglycemic Agents  Renal/GU negative Renal ROS     Musculoskeletal   Abdominal   Peds  Hematology   Anesthesia Other Findings   Reproductive/Obstetrics                             Anesthesia Physical Anesthesia Plan  ASA: III  Anesthesia Plan: General   Post-op Pain Management:    Induction: Intravenous  PONV Risk Score and Plan: 2 and Dexamethasone, Ondansetron and Treatment may vary due to age or medical condition  Airway Management Planned: LMA  Additional Equipment:   Intra-op Plan:   Post-operative Plan: Extubation in OR  Informed Consent: I have reviewed the patients History and Physical, chart, labs and discussed the procedure including the risks, benefits and alternatives for the proposed anesthesia with the patient or authorized representative who has indicated his/her understanding and acceptance.     Dental advisory given  Plan Discussed with: CRNA  Anesthesia Plan Comments:        Anesthesia Quick Evaluation

## 2019-04-09 NOTE — Op Note (Addendum)
04/09/2019  4:58 PM  PATIENT:  Oscar Mccarty  53 y.o. male  Patient Care Team: Enid Skeens., MD as PCP - General (Family Medicine) Ladell Pier, MD as Consulting Physician (Oncology) Jackquline Denmark, MD as Consulting Physician (Gastroenterology) Michael Boston, MD as Consulting Physician (General Surgery)  PRE-OPERATIVE DIAGNOSIS:  COLON CANCER, NEED FOR CHEMOTHERAPY  POST-OPERATIVE DIAGNOSIS:  COLON CANCER, NEED FOR CHEMOTHERAPY  PROCEDURE:  Procedure(s): PLACEMENT OF PORT-A-CATHETER UNDER ULTRASOUND AND FLUOROSCOPIC GUIDANCE  SURGEON:  [name], MD  ASSISTANT: none   ANESTHESIA:   local and general  EBL:  No intake/output data recorded.  Delay start of Pharmacological VTE agent (>24hrs) due to surgical blood loss or risk of bleeding:  no  DRAINS: none   SPECIMEN:  No Specimen  DISPOSITION OF SPECIMEN:  N/A  COUNTS:  YES  PLAN OF CARE: Discharge to home after PACU  PATIENT DISPOSITION:  PACU - hemodynamically stable.  INDICATION: Patient with need for IV therapy.  Colorectal cancer requiring chemotherapy.  Port-A-Cath placement was requested.  Use of a central venous catheter for intravenous therapy was discussed.  Technique of catheter placement using ultrasound and fluoroscopy guidance was discussed.  Risks such as bleeding, infection, pneumothorax, catheter occlusion, reoperation, and other risks were discussed.   I noted a good likelihood this will help address the problem.  Questions were answered.  The patient expressed understanding & wishes to proceed.  OR FINDINGS: Normal-appearing anatomy.  Is an 8 [place] power port. It goes through the right internal jugular vein.  Tip rests in the SVC/right atrial junction  DESCRIPTION:   Procedure: Informed consent was confirmed. Patient was brought the operating room. and positioned supine. Arms were tucked. The patient underwent deep sedation. Neck and chest were clipped and prepped and draped in a sterile  fashion. A surgical timeout confirmed our plan.  I placed a field block of local anesthesia on the neck & chest  I used the ultrasound to identify the right internal jugular vein. I entered into it using on the first venipuncture under direct ultrasound guidance. Wire was passed into the inferior vena cava by fluoroscopy.  I made an incision in the lateral infraclavicular pocket. Made a subcutaneous pocket. I tunneled the power port from the chest wound to the neck puncture site. The port was secured to the left anterior chest wall using 2-0 Prolene interrupted stitches x4. Catheter flushed well.  I used a dilator on the wire using Seldinger technique to dilate the neck tract. Then I used a dilator with a peel away sheath.  We used fluoroscopy.  I pulled the wire back into the right atrial/superior vena cava junction.  I pulled the wire back until it was at the neck puncture site. This gave the true measurement of the intravenous segment. I cut the catheter to appropriate length. I removed the wire and dilator. The catheter was placed within the sheath. The sheath was peeled away.  Fluoroscopy confirmed the tip in the proximal right atrium.  Pulled back a centimeter until it was at the right atrial/ SVC junction.  Catheter aspirated and flushed well.  Final flush of full-strength heparin given.  On final fluoro reevaluation the tip seen to be in good position in the distal SVC.    I closed the wounds using 4 Monocryl stitch & placed sterile dressings. Patient should go home later today. Catheter is okay to use.  I discussed operative findings, updated the patient's status, discussed probable steps to recovery, and gave  postoperative recommendations to the patient's spouse.  Instructions written in [place] and in [place].  Recommendations were made.  Questions were answered.  She expressed understanding & appreciation.   [name], M.D., F.A.C.S. Gastrointestinal and Minimally Invasive  [facility] [address] [place], [place] [address] [phone] Main / Paging

## 2019-04-09 NOTE — Discharge Instructions (Signed)
PORT-A-CATHETER PLACEMENT:  INSTRUCTIONS AFTER SURGERY  The Chemotherapy / Oncology nurse will remove your waterproof bandages in their office a few days after surgery.  Do not remove the bandages unless you are at least 5 days out from surgery or as otherwise instructed by the [place]  DIET: Follow a light bland diet the first night after arrival home, such as soup, liquids, crackers, etc.  Be sure to include lots of fluids daily.  Avoid fast food or heavy meals as your are more likely to get nauseated.    Take your usually prescribed home medications unless otherwise directed.  PAIN CONTROL: Pain is best controlled by a usual combination of three different methods TOGETHER: Ice/Heat Over the counter acetaminophen pain medication Prescription pain medication  Most patients will experience some swelling and bruising around the incisions.  Ice packs or heating pads (30-60 minutes up to 6 times a day) will help. Use ice for the first few days to help decrease swelling and bruising, then switch to heat to help relax tight/sore spots and speed recovery.  Some people prefer to use ice alone, heat alone, alternating between ice & heat.  Experiment to what works for you.  Swelling and bruising can take several weeks to resolve.    It is helpful to take an over-the-counter pain medication regularly for the first few weeks.  Using acetaminophen (Tylenol, etc) 500-650mg  four times a day (every meal & bedtime) is usually safest since NSAIDs like ibuprofen are not advisable in patients with kidney disease.  A  prescription for pain medication (such as oxycodone, hydrocodone, etc) may be given to you upon discharge.  Take your pain medication as prescribed.   If you are having problems/concerns with the prescription medicine (does not control pain, nausea, vomiting, rash, itching, etc), please call us [phone] to see if we need to switch you to a different pain medicine that will work better for  you and/or control your side effect better. If you need a refill on your pain medication, please contact your pharmacy.  They will contact our office to request authorization. Prescriptions will not be filled after 5 pm or on week-ends.  Avoid getting constipated.   Between the surgery and the pain medications, it is common to experience some constipation.  Increasing fluid intake and taking a fiber supplement (such as Metamucil, Citrucel, FiberCon, MiraLax, etc) 1-2 times a day regularly will usually help prevent this problem from occurring.  A mild laxative (prune juice, Milk of Magnesia, MiraLax, etc) should be taken according to package directions if there are no bowel movements after 48 hours.    Wash / shower every day.   You may shower over the dressings as they are waterproof.  Continue to shower over incision(s) after the dressing is off.  The Chemotherapy / Oncology nurse will remove your waterproof bandages in their office a few days after surgery.  Do not remove the bandages unless you are 5 days out from surgery  ACTIVITIES as tolerated:   You may resume regular (light) daily activities beginning the next day--such as daily self-care, walking, climbing stairs--gradually increasing activities as tolerated.  If you can walk 30 minutes without difficulty, it is safe to try more intense activity such as jogging, treadmill, bicycling, low-impact aerobics, swimming, etc. Save the most intensive and strenuous activity for last such as push-ups, heavy lifting, contact sports, etc  Refrain from any heavy lifting or straining until you are off narcotics for pain control.  DO NOT PUSH THROUGH PAIN.  Let pain be your guide: If it hurts to do something, don't do it.  Pain is your body warning you to avoid that activity for another week until the pain goes down. You may drive when you are no longer taking prescription pain medication, you can comfortably wear a seatbelt, and you can safely maneuver  your car and apply brakes. You may have sexual intercourse when it is comfortable.   FOLLOW UP with the Oncology / Chemotherapy nurses closely after surgery. Please call [facility] at [phone] only as needed.  The infusion nurses & Oncology usually follow you closely, making the need for follow-up in our office redundant and therefore not needed.  If they or you have concerns, please call us for possible follow-up in our office 9. IF YOU HAVE DISABILITY OR FAMILY LEAVE FORMS, BRING THEM TO THE OFFICE FOR PROCESSING.  DO NOT GIVE THEM TO YOUR DOCTOR.   WHEN TO CALL us [phone]: Poor pain control Reactions / problems with new medications (rash/itching, nausea, etc)  Fever over 101.5 F (38.5 C) Worsening swelling or bruising Continued bleeding from incision. Increased pain, redness, or drainage from the incision   The clinic staff is available to answer your questions during regular business hours (8:30am-5pm).  Please dont hesitate to call and ask to speak to one of our nurses for clinical concerns.   If you have a medical emergency, go to the nearest emergency room or call 911.  A surgeon from [facility] is always on call at the [facility], [place], [place], [place], [place]  [address] ? MAIN: [phone] ? TOLL FREE: [phone] ?  FAX [phone] [email or web address]   COLOCACIN DEL PUERTO A CATTER:  INSTRUCCIONES DESPUS DE LA CIRUGA  DIETA: Siga una dieta ligera y blanda la primera noche despus de llegar a casa, como sopa, lquidos, galletas saladas, etc. Asegrese de incluir muchos lquidos a diario. Evite la comida rpida o las comidas pesadas, ya que es ms probable que sienta nuseas.   Tome sus medicamentos caseros recetados habitualmente a menos que se le indique lo contrario.  CONTROL DE DOLOR: El dolor se controla mejor mediante una combinacin habitual de tres mtodos diferentes  JUNTOS: Hielo / Calor Analgsicos con acetaminofn de venta libre Analgsicos recetados  La mayora de los pacientes experimentarn algo de hinchazn y hematomas alrededor de las incisiones. Las bolsas de hielo o las almohadillas trmicas (de 30 a 60 minutos hasta 6 veces al da) ayudarn. Use hielo durante los primeros das para ayudar a disminuir la hinchazn y los hematomas, luego cambie a calor para ayudar a Media planner los puntos tensos / doloridos y Engineer, water. Algunas Presenter, broadcasting solo, solo calor, alternando entre hielo y Freight forwarder. Experimente con lo que funcione para usted. La hinchazn y los hematomas pueden tardar varias semanas en resolverse.  Es til Merchandiser, retail de venta libre con regularidad durante las primeras semanas. El uso de acetaminofn (Tylenol, etc.) 500-650 mg cuatro veces al da (cada comida y antes de Acupuncturist) suele ser ms seguro, ya que los AINE como el ibuprofeno no son recomendables en pacientes con enfermedad renal.  Es posible que le den una receta para analgsicos (como oxicodona, hidrocodona, etc.) al momento del alta. Tome sus analgsicos segn lo prescrito.  Si tiene problemas / inquietudes con el medicamento recetado (no controla el dolor, las nuseas, los vmitos, el sarpullido, la [place],  etc.), llmenos al ([phone] para ver si necesitamos cambiarlo a otro medicamento para Conservation officer, historic buildings. que funcionar mejor para usted y / o Chief Technology Officer mejor sus efectos secundarios. Si necesita reabastecer su medicamento para el dolor, comunquese con su farmacia. Se comunicarn con nuestra oficina para solicitar autorizacin. Las recetas no se surtirn despus de las 5 pm o los fines de [place].  Evite el estreimiento. Entre la [place] y los analgsicos, es comn experimentar algo de estreimiento. Aumentar la ingesta de lquidos y tomar un suplemento de fibra (como Metamucil, Citrucel, FiberCon, [place], Social research officer, government.) de 1 a 2 veces al da con  regularidad generalmente ayudar a evitar que ocurra este problema. Se debe tomar un laxante suave (jugo de [place], [place] de magnesia, [place], etc.) de acuerdo con las instrucciones del paquete si no hay evacuaciones intestinales despus de 48 horas.  Lvese / dchese [employer]. Puede ducharse sobre los apsitos ya que son impermeables. Contine duchndose [employer] (s) incisin (es) despus de quitarse el apsito.  La enfermera de Quim[place]er[place]ia / Oncologa le quitar los vendajes impermeables en su consultorio unos das despus de la [place]. No retire los vendajes a Futures trader falten 5 das para la [place].  ACTIVIDADES segn lo tolerado: Puede reanudar las actividades diarias regulares (ligeras) a Proofreader del da siguiente, como el cuidado personal diario, Writer, subir escaleras, aumentando gradualmente las actividades segn las [place]. Si puede caminar 30 minutos sin dificultad, es seguro intentar actividades ms intensas como trotar, andar en cinta, andar en bicicleta, aerbicos de bajo impacto, nadar, etc. Guarde la actividad ms intensa y extenuante para el final, como lagartijas, levantar objetos pesados, deportes de contacto, etc. Abstenerse de levantar objetos pesados ??o hacer esfuerzos hasta que deje de tomar narcticos para Financial controller.  NO EMPUJE EL DOLOR. Deje que el dolor sea su gua: si le duele hacer algo, no lo haga. El dolor es su cuerpo que le advierte que evite esa actividad durante otra semana hasta que el dolor disminuya. Puede conducir cuando ya no est tomando analgsicos recetados, pueda usar un cinturn de seguridad cmodamente y Emergency planning/management officer con seguridad su automvil y Midwife los frenos. Puede tener relaciones sexuales cuando sea cmodo.  SEGUIMIENTO con las enfermeras de [place] / [place] de cerca despus de la ciruga. Llame a [facility] al ([phone] solo cuando sea necesario.  Las enfermeras de infusin y Passenger transport manager generalmente lo siguen de  cerca, lo que hace que la necesidad de seguimiento en nuestra oficina sea redundante y, por lo [place], no sea necesaria. Si ellos o usted tiene inquietudes, llmenos para un posible seguimiento en nuestra oficina. [place], LLEVARLOS A LA OFICINA PARA PROCESAR. NO SE LOS D A SU MDICO.   CUNDO LLAMARNOS ([phone]: Mal control del dolor Reacciones / problemas con nuevos medicamentos (sarpullido / picazn, nuseas, etc.) Fiebre superior a 101,5 F (38,5 C) Empeoramiento de la hinchazn o hematomas Sangrado continuo de la incisin. Aumento del Social research officer, government, enrojecimiento o supuracin de la incisin  El personal de la clnica est disponible para responder sus preguntas durante el horario comercial habitual (8:30 am a 5:00 pm). No dude en llamar y pedir hablar con una de nuestras enfermeras si tiene inquietudes clnicas. Si tiene [employer], vaya a la sala de emergencias ms cercana o llame al 911. [place] [facility] siempre est disponible en los hospitales.   [place], [place], [address],  [place], [place] [address]? PRINCIPAL: [phone]? LLAMADA GRATUITAJS:[id number]? FAX [phone] [email or web address]

## 2019-04-12 ENCOUNTER — Other Ambulatory Visit: Payer: Self-pay | Admitting: Oncology

## 2019-04-13 ENCOUNTER — Inpatient Hospital Stay: Payer: BC Managed Care – PPO

## 2019-04-13 ENCOUNTER — Encounter: Payer: Self-pay | Admitting: Nurse Practitioner

## 2019-04-13 ENCOUNTER — Inpatient Hospital Stay: Payer: BC Managed Care – PPO | Admitting: Nurse Practitioner

## 2019-04-13 ENCOUNTER — Telehealth: Payer: Self-pay | Admitting: *Deleted

## 2019-04-13 ENCOUNTER — Other Ambulatory Visit: Payer: Self-pay

## 2019-04-13 VITALS — BP 114/73 | HR 69 | Temp 98.0°F | Resp 17 | Ht 67.0 in | Wt 163.9 lb

## 2019-04-13 DIAGNOSIS — E119 Type 2 diabetes mellitus without complications: Secondary | ICD-10-CM | POA: Diagnosis not present

## 2019-04-13 DIAGNOSIS — Z8619 Personal history of other infectious and parasitic diseases: Secondary | ICD-10-CM | POA: Diagnosis not present

## 2019-04-13 DIAGNOSIS — Z79899 Other long term (current) drug therapy: Secondary | ICD-10-CM | POA: Diagnosis not present

## 2019-04-13 DIAGNOSIS — Z5111 Encounter for antineoplastic chemotherapy: Secondary | ICD-10-CM | POA: Diagnosis present

## 2019-04-13 DIAGNOSIS — C187 Malignant neoplasm of sigmoid colon: Secondary | ICD-10-CM | POA: Diagnosis not present

## 2019-04-13 DIAGNOSIS — R918 Other nonspecific abnormal finding of lung field: Secondary | ICD-10-CM | POA: Diagnosis not present

## 2019-04-13 DIAGNOSIS — Z95828 Presence of other vascular implants and grafts: Secondary | ICD-10-CM | POA: Insufficient documentation

## 2019-04-13 DIAGNOSIS — Z9049 Acquired absence of other specified parts of digestive tract: Secondary | ICD-10-CM | POA: Diagnosis not present

## 2019-04-13 DIAGNOSIS — Z7984 Long term (current) use of oral hypoglycemic drugs: Secondary | ICD-10-CM | POA: Diagnosis not present

## 2019-04-13 LAB — CBC WITH DIFFERENTIAL (CANCER CENTER ONLY)
Abs Immature Granulocytes: 0.01 10*3/uL (ref 0.00–0.07)
Basophils Absolute: 0 10*3/uL (ref 0.0–0.1)
Basophils Relative: 1 %
Eosinophils Absolute: 0.1 10*3/uL (ref 0.0–0.5)
Eosinophils Relative: 3 %
HCT: 42.3 % (ref 39.0–52.0)
Hemoglobin: 13.9 g/dL (ref 13.0–17.0)
Immature Granulocytes: 0 %
Lymphocytes Relative: 37 %
Lymphs Abs: 1.6 10*3/uL (ref 0.7–4.0)
MCH: 26 pg (ref 26.0–34.0)
MCHC: 32.9 g/dL (ref 30.0–36.0)
MCV: 79.2 fL — ABNORMAL LOW (ref 80.0–100.0)
Monocytes Absolute: 0.5 10*3/uL (ref 0.1–1.0)
Monocytes Relative: 11 %
Neutro Abs: 2.1 10*3/uL (ref 1.7–7.7)
Neutrophils Relative %: 48 %
Platelet Count: 174 10*3/uL (ref 150–400)
RBC: 5.34 MIL/uL (ref 4.22–5.81)
RDW: 12.5 % (ref 11.5–15.5)
WBC Count: 4.3 10*3/uL (ref 4.0–10.5)
nRBC: 0 % (ref 0.0–0.2)

## 2019-04-13 LAB — CMP (CANCER CENTER ONLY)
ALT: 48 U/L — ABNORMAL HIGH (ref 0–44)
AST: 32 U/L (ref 15–41)
Albumin: 4.2 g/dL (ref 3.5–5.0)
Alkaline Phosphatase: 82 U/L (ref 38–126)
Anion gap: 8 (ref 5–15)
BUN: 14 mg/dL (ref 6–20)
CO2: 25 mmol/L (ref 22–32)
Calcium: 9.4 mg/dL (ref 8.9–10.3)
Chloride: 105 mmol/L (ref 98–111)
Creatinine: 0.82 mg/dL (ref 0.61–1.24)
GFR, Est AFR Am: >60 mL/min (ref >60)
GFR, Estimated: >60 mL/min (ref >60)
Glucose, Bld: 210 mg/dL — ABNORMAL HIGH (ref 70–99)
Potassium: 4.3 mmol/L (ref 3.5–5.1)
Sodium: 138 mmol/L (ref 135–145)
Total Bilirubin: 0.3 mg/dL (ref 0.3–1.2)
Total Protein: 7.4 g/dL (ref 6.5–8.1)

## 2019-04-13 MED ORDER — SODIUM CHLORIDE 0.9% FLUSH
10.0000 mL | INTRAVENOUS | Status: DC | PRN
  Administered 2019-04-13: 10 mL
  Filled 2019-04-13: qty 10

## 2019-04-13 MED ORDER — HEPARIN SOD (PORK) LOCK FLUSH 100 UNIT/ML IV SOLN
500.0000 [IU] | Freq: Once | INTRAVENOUS | Status: AC | PRN
  Administered 2019-04-13: 11:00:00 500 [IU]
  Filled 2019-04-13: qty 5

## 2019-04-13 NOTE — Telephone Encounter (Signed)
Dr. Johney Maine has put patient out of work until 04/22/2019. Beginning 04/23/2019 they need Dr. Benay Spice to continue his out of work certification. She has already faxed a "return to work form". Also need updated notes to continue. Need his treatment plan, schedule and how often he will be seen by physician.  Please call her to discuss further and use case [phone].

## 2019-04-13 NOTE — Progress Notes (Signed)
  [facility] OFFICE PROGRESS NOTE   Diagnosis: Colon cancer  INTERVAL HISTORY:   Oscar Mccarty returns as scheduled.  He describes his appetite is "okay".  He has mild discomfort related to the Port-A-Cath.  Bowels are moving.  No nausea or vomiting.  Objective:  Vital signs in last 24 hours:  Blood pressure 114/73, pulse 69, temperature 98 F (36.7 C), temperature source Oral, resp. rate 17, height '5\' 7"'$  (1.702 m), weight 163 lb 14.4 oz (74.3 kg), SpO2 100 %.    GI: Abdomen soft and nontender.  No hepatomegaly. Vascular: No leg edema.  Calves soft and nontender. Skin: No rash. Port-A-Cath without erythema.   Lab Results:  Lab Results  Component Value Date   WBC 4.3 04/13/2019   HGB 13.9 04/13/2019   HCT 42.3 04/13/2019   MCV 79.2 (L) 04/13/2019   PLT 174 04/13/2019   NEUTROABS 2.1 04/13/2019    Imaging:  No results found.  Medications: I have reviewed the patient's current medications.  Assessment/Plan: 1. 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 Microsatellite stable, no loss of mismatch repair protein expression ? CTs 12/24/2018- sigmoid: Wall thickening, multiple enlarged nodes adjacent to the sigmoid colon, no evidence of metastatic disease ? Cycle 1 FOLFOX 04/14/2019  2. COVID-19 respiratory infection June 2020 3. Diabetes diagnosed June 2020 4. Port-A-Cath placement, Dr. Johney Maine, 04/09/2019    Disposition: Oscar Mccarty appears stable.  He is scheduled to begin adjuvant FOLFOX tomorrow.  He has attended the chemotherapy education class.  We again reviewed potential toxicities at today's visit.  He has Compazine at home to take if needed.  We reviewed the CBC from today.  Counts adequate to proceed with treatment as above.  He will return for lab, follow-up, cycle 2 FOLFOX in 2 weeks.  He will contact the office in the interim with any problems.    Ned Card ANP/GNP-BC   04/13/2019  11:44  AM

## 2019-04-13 NOTE — Progress Notes (Signed)
Met with patient at registration to introduce myself as Arboriculturist and to offer available resources.  Discussed one-time $35 Engineer, drilling to assist with personal expenses while going through treatment.  Gave him my card if interested in applying and for any additional financial questions or concerns.

## 2019-04-14 ENCOUNTER — Other Ambulatory Visit: Payer: Self-pay

## 2019-04-14 ENCOUNTER — Encounter (HOSPITAL_COMMUNITY): Payer: Self-pay | Admitting: Surgery

## 2019-04-14 ENCOUNTER — Inpatient Hospital Stay (HOSPITAL_BASED_OUTPATIENT_CLINIC_OR_DEPARTMENT_OTHER): Payer: BC Managed Care – PPO | Admitting: Medical

## 2019-04-14 ENCOUNTER — Inpatient Hospital Stay: Payer: BC Managed Care – PPO

## 2019-04-14 ENCOUNTER — Telehealth: Payer: Self-pay | Admitting: Nurse Practitioner

## 2019-04-14 VITALS — BP 123/70 | HR 71 | Temp 98.7°F | Resp 18

## 2019-04-14 DIAGNOSIS — C187 Malignant neoplasm of sigmoid colon: Secondary | ICD-10-CM

## 2019-04-14 MED ORDER — DEXAMETHASONE SODIUM PHOSPHATE 10 MG/ML IJ SOLN
10.0000 mg | Freq: Once | INTRAMUSCULAR | Status: AC
  Administered 2019-04-14: 13:00:00 10 mg via INTRAVENOUS

## 2019-04-14 MED ORDER — DEXAMETHASONE SODIUM PHOSPHATE 10 MG/ML IJ SOLN
INTRAMUSCULAR | Status: AC
  Filled 2019-04-14: qty 1

## 2019-04-14 MED ORDER — PALONOSETRON HCL INJECTION 0.25 MG/5ML
0.2500 mg | Freq: Once | INTRAVENOUS | Status: AC
  Administered 2019-04-14: 13:00:00 0.25 mg via INTRAVENOUS

## 2019-04-14 MED ORDER — OXALIPLATIN CHEMO INJECTION 100 MG/20ML
85.0000 mg/m2 | Freq: Once | INTRAVENOUS | Status: AC
  Administered 2019-04-14: 14:00:00 160 mg via INTRAVENOUS
  Filled 2019-04-14: qty 32

## 2019-04-14 MED ORDER — DEXTROSE 5 % IV SOLN
Freq: Once | INTRAVENOUS | Status: AC
  Administered 2019-04-14: 14:00:00 via INTRAVENOUS
  Filled 2019-04-14: qty 250

## 2019-04-14 MED ORDER — SODIUM CHLORIDE 0.9 % IV SOLN
2400.0000 mg/m2 | INTRAVENOUS | Status: DC
  Administered 2019-04-14: 17:00:00 4500 mg via INTRAVENOUS
  Filled 2019-04-14: qty 90

## 2019-04-14 MED ORDER — DEXTROSE 5 % IV SOLN
Freq: Once | INTRAVENOUS | Status: AC
  Administered 2019-04-14: 13:00:00 via INTRAVENOUS
  Filled 2019-04-14: qty 250

## 2019-04-14 MED ORDER — PALONOSETRON HCL INJECTION 0.25 MG/5ML
INTRAVENOUS | Status: AC
  Filled 2019-04-14: qty 5

## 2019-04-14 MED ORDER — LEUCOVORIN CALCIUM INJECTION 350 MG
400.0000 mg/m2 | Freq: Once | INTRAVENOUS | Status: AC
  Administered 2019-04-14: 14:00:00 748 mg via INTRAVENOUS
  Filled 2019-04-14: qty 37.4

## 2019-04-14 MED ORDER — SODIUM CHLORIDE 0.9 % IV SOLN: 10.0000 mg | Freq: Once | INTRAVENOUS | Status: DC

## 2019-04-14 MED ORDER — FLUOROURACIL CHEMO INJECTION 2.5 GM/50ML
400.0000 mg/m2 | Freq: Once | INTRAVENOUS | Status: AC
  Administered 2019-04-14: 16:00:00 750 mg via INTRAVENOUS
  Filled 2019-04-14: qty 15

## 2019-04-14 NOTE — Telephone Encounter (Signed)
Scheduled per los. Gave calendar  

## 2019-04-14 NOTE — Patient Instructions (Addendum)
[facility] Discharge Instructions for Patients Receiving Chemotherapy  Today you received the following chemotherapy agents: Oxaliplatin/Leucovorin/5FU  To help prevent nausea and vomiting after your treatment, we encourage you to take your nausea medication as directed.   If you develop nausea and vomiting that is not controlled by your nausea medication, call the clinic.   BELOW ARE SYMPTOMS THAT SHOULD BE REPORTED IMMEDIATELY:  *FEVER GREATER THAN 100.5 F  *CHILLS WITH OR WITHOUT FEVER  NAUSEA AND VOMITING THAT IS NOT CONTROLLED WITH YOUR NAUSEA MEDICATION  *UNUSUAL SHORTNESS OF BREATH  *UNUSUAL BRUISING OR BLEEDING  TENDERNESS IN MOUTH AND THROAT WITH OR WITHOUT PRESENCE OF ULCERS  *URINARY PROBLEMS  *BOWEL PROBLEMS  UNUSUAL RASH Items with * indicate a potential emergency and should be followed up as soon as possible.  Feel free to call the clinic should you have any questions or concerns. The clinic phone number is [phone].  Please show the [place] at check-in to the Emergency Department and triage nurse.  Oxaliplatin Injection Qu es este medicamento? El OXALIPLATINO es un agente quimioteraputico. Este medicamento acta sobre las clulas que se dividen rpidamente, como las clulas cancerosas, y finalmente provoca la muerte de estas clulas. Se utiliza en el tratamiento del cncer de colon y recto y 54 tipos de cncer. Este medicamento puede ser utilizado para otros usos; si tiene alguna pregunta consulte con su proveedor de atencin mdica o con su farmacutico. MARCAS COMUNES: Eloxatin Qu le debo informar a mi profesional de la salud antes de tomar este medicamento? Necesita saber si usted presenta alguno de los siguientes problemas o situaciones:  enfermedad renal  una reaccin alrgica o inusual al oxaliplatino, a otros agentes quimioteraputicos, a otros medicamentos, alimentos, colorantes o conservantes  si est  embarazada o buscando quedar embarazada  si est amamantando a un beb Cmo debo utilizar este medicamento? Este medicamento se administra mediante infusin por va intravenosa. Lo administra un profesional de la salud calificado en un hospital o en un entorno clnico. Hable con su pediatra para informarse acerca del uso de este medicamento en nios. Puede requerir atencin especial. Sobredosis: Pngase en contacto inmediatamente con un centro toxicolgico o una sala de urgencia si usted cree que haya tomado demasiado medicamento. ATENCIN: [employer] es solo para usted. No comparta este medicamento con nadie. Qu sucede si me olvido de una dosis? Es importante no olvidar ninguna dosis. Informe a su mdico o a su profesional de la salud si no puede asistir a Photographer. Qu puede interactuar con este medicamento?  medicamentos para incrementar los conteos sanguneos, tales como filgrastim, pegfilgrastim, sargramostim  probenecid  ciertos antibiticos, tales como amicacina, gentamicina, neomicina, polimixina B, estreptomicina, tobramicina  zalcitabina Consulte a su mdico o a su profesional de la salud antes de tomar cualquiera de los siguientes medicamentos:  acetaminofeno  aspirina  ibuprofeno  quetoprofeno  naproxeno Puede ser que esta lista no menciona todas las posibles interacciones. Informe a su profesional de KB Home	[place] de [employer] productos a base de hierbas, medicamentos de [place] o suplementos nutritivos que est tomando. Si usted fuma, consume bebidas alcohlicas o si utiliza drogas ilegales, indqueselo tambin a su profesional de KB Home	[place]. Algunas sustancias pueden interactuar con su medicamento. A qu debo estar atento al usar [employer]? Se supervisar su condicin atentamente mientras reciba este medicamento. Tendr que hacerse anlisis de sangre peridicos mientras reciba este medicamento. Este medicamento puede aumentar su sensibilidad al fro. No  tomar bebidas fras  o usar hielo. Cubra la piel expuesta antes de estar en contacto con temperaturas fras u objetos fros. Mientras se encuentra afuera cuando haga fro use ropa [place] y [place] su boca y su nariz para calentar el aire que entra en sus pulmones. Informe a su mdico si experimenta sensibilidad al fro. Este medicamento puede hacerle sentir un Nurse, mental health. Esto es normal ya que la quimioterapia afecta tanto a las clulas sanas como a las clulas cancerosas. Si presenta alguno de los [employer], infrmelos. Sin embargo, contine con el tratamiento aun si se siente enfermo, a menos que su mdico le indique que lo suspenda. En algunos casos, podr recibir [employer] para ayudarle con los efectos secundarios. Siga las instrucciones para usarlos. Consulte a su mdico o a su profesional de la salud por asesoramiento si tiene fiebre, escalofros, dolor de garganta o cualquier otro sntoma de resfro o gripe. No se trate usted mismo. Este medicamento puede reducir la capacidad del cuerpo para combatir infecciones. Trate de no acercarse a personas que estn enfermas. [employer] puede aumentar el riesgo de magulladuras o sangrado. Consulte a su mdico o a su profesional de la salud si observa sangrados inusuales. Proceda con cuidado al cepillar sus dientes, usar hilo dental o Risk manager palillos para los dientes, ya que puede contraer una infeccin o Therapist, art con mayor facilidad. Si se somete a algn tratamiento dental, informe a su dentista que est [employer]. Evite tomar productos que contienen aspirina, acetaminofeno, ibuprofeno, naproxeno o quetoprofeno a menos que as lo indique su mdico. Estos productos pueden disimular la fiebre. No se debe quedar embarazada mientras reciba este medicamento. Las mujeres deben informar a su mdico si estn buscando quedar embarazadas o si creen que estn embarazadas. Existe la posibilidad de efectos secundarios graves  a un beb sin nacer. Para ms informacin hable con su profesional de la salud o su farmacutico. No debe Economist a un beb mientras reciba este medicamento. Si tiene diarrea, llame a su mdico o a su profesional de KB Home	[place]. No se trate usted mismo. Qu efectos secundarios puedo tener al [employer] este medicamento? Efectos secundarios que debe informar a su mdico o a Barrister's clerk de la salud tan pronto como sea posible:  Chief of Staff como erupcin cutnea, picazn o urticarias, hinchazn de la cara, labios o lengua  conteos sanguneos bajos - este medicamento puede reducir la cantidad de glbulos blancos, glbulos rojos y plaquetas. Su riesgo de infeccin y [place].  signos de infeccin - fiebre o escalofros, tos, dolor de garganta, Social research officer, government o dificultad para orinar  signos de reduccin de plaquetas o sangrado - magulladuras, puntos rojos en la piel, heces de color oscuro o con aspecto alquitranado, sangrado por la nariz  signos de reduccin de glbulos rojos - cansancio o debilidad inusual, desmayos, sensacin de Lobbyist u opresin en el pecho  tos  diarrea  tensin en la mandbula  llagas en la boca  nuseas, vmito  dolor, hinchazn, enrojecimiento o irritacin en el lugar de la inyeccin  dolor, hormigueo, entumecimiento de manos o pies  problemas de coordinacin, del habla, al caminar  enrojecimiento, formacin de ampollas, descamacin o distensin de la piel, inclusive dentro de la boca  dificultad para orinar o cambios en el volumen de orina Efectos secundarios que, por lo general, no requieren atencin mdica (debe informarlos a su mdico o a su profesional de la salud si persisten o si son molestos):  cambios en la  visin  estreimiento  cada del cabello  prdida del apetito  sabor metlico en la boca o cambios en el sentido del gusto  dolor estomacal Puede ser que esta lista no menciona todos los  posibles efectos secundarios. Comunquese a su mdico por asesoramiento mdico [employer]. Usted puede informar los efectos secundarios a la FDA por telfono al 1-800-FDA-1088. Dnde debo guardar mi medicina? Este medicamento se administra en hospitales o clnicas y no necesitar guardarlo en su domicilio. ATENCIN: Este folleto es un resumen. Puede ser que no cubra toda la posible informacin. Si usted tiene preguntas acerca de esta medicina, consulte con su mdico, su farmacutico o su profesional de Technical sales engineer.  2020 Elsevier/Gold Standard (2014-08-31 00:00:00)  Leucovorin injection Qu es este medicamento? La LEUCOVORINA se [place] para prevenir o tratar [employer] nocivos de ciertos medicamentos. Este medicamento tambin sirve para tratar la anemia provocada por una nivel bajo de cido flico en el cuerpo. Tambin se puede administrar con 5-fluorouracilo (5-FU), para tratar el cncer de colon. Este medicamento puede ser utilizado para otros usos; si tiene alguna pregunta consulte con su proveedor de atencin mdica o con su farmacutico. Qu le debo informar a mi profesional de la salud antes de tomar este medicamento? Necesita saber si usted presenta alguno de los siguientes problemas o situaciones:  anemia debido a bajos niveles de vitamina B-12 en la sangre  una reaccin alrgica o inusual a la leucovorina, cido flico, a otros medicamentos, alimentos, colorantes o conservantes  si est embarazada o buscando quedar embarazada  si est amamantando a un beb Cmo debo utilizar este medicamento? Este medicamento se administra mediante inyeccin por va intramuscular o intravenosa. Lo administra un profesional de Technical sales engineer en un hospital o en un entorno clnico. Hable con su pediatra para informarse acerca del uso de este medicamento en nios. Puede requerir atencin especial. Sobredosis: Pngase en contacto inmediatamente con un centro toxicolgico o una sala de  urgencia si usted cree que haya tomado demasiado medicamento. ATENCIN: [employer] es solo para usted. No comparta este medicamento con nadie. Qu sucede si me olvido de una dosis? No se aplica en este caso. Qu puede interactuar con este medicamento?  capecitabina  fluorouracilo  fenobarbital  fenitona  primidona  trimetoprima- sulfametoxasol Puede ser que esta lista no menciona todas las posibles interacciones. Informe a su profesional de KB Home	[place] de [employer] productos a base de hierbas, medicamentos de [place] o suplementos nutritivos que est tomando. Si usted fuma, consume bebidas alcohlicas o si utiliza drogas ilegales, indqueselo tambin a su profesional de KB Home	[place]. Algunas sustancias pueden interactuar con su medicamento. A qu debo estar atento al usar [employer]? Se supervisar su estado de salud atentamente mientras reciba este medicamento. Este medicamento puede aumentar los efectos secundarios de 5-fluorouracilo, 5-FU. Si tiene diarrea o [employer] boca que no mejoran o que [place], consulte a su mdico o a su profesional de KB Home	[place]. Qu efectos secundarios puedo tener al [employer] este medicamento? Efectos secundarios que debe informar a su mdico o a Barrister's clerk de la salud tan pronto como sea posible:  Chief of Staff como erupcin cutnea, picazn o urticarias, hinchazn de la cara, labios o lengua  problemas respiratorios  fiebre, infeccin  llagas en la boca  sangrado, magulladuras inusuales  cansancio o debilidad inusual Efectos secundarios que, por lo general, no requieren atencin mdica (debe informarlos a su mdico o a su profesional de la salud si persisten o si son  molestos):  estreimiento o diarrea  prdida del apetito  nuseas, vmito Puede ser que esta lista no menciona todos los posibles efectos secundarios. Comunquese a su mdico por asesoramiento mdico [employer]. Usted puede informar  los efectos secundarios a la FDA por telfono al 1-800-FDA-1088. Dnde debo guardar mi medicina? Este medicamento se administra en hospitales o clnicas y no necesitar guardarlo en su domicilio. ATENCIN: Este folleto es un resumen. Puede ser que no cubra toda la posible informacin. Si usted tiene preguntas acerca de esta medicina, consulte con su mdico, su farmacutico o su profesional de Technical sales engineer.  2020 Elsevier/Gold Standard (2014-08-31 00:00:00)  Fluorouracil, 5-FU injection Qu es este medicamento? El [place], 5-FU es un agente quimioteraputico. Este medicamento reduce el crecimiento de las clulas cancerosas. Se utiliza en el tratamiento de muchos tipos de cncer, incluyendo el cncer de mama, de colon y recto, pancretico y de Paramedic. Este medicamento puede ser utilizado para otros usos; si tiene alguna pregunta consulte con su proveedor de atencin mdica o con su farmacutico. MARCAS COMUNES: [place] Qu le debo informar a mi profesional de la salud antes de tomar este medicamento? Necesita saber si usted presenta alguno de los siguientes problemas o situaciones:  trastornos sanguneos  deficiencia de la dihidropirimidina deshidrogenasa (DPD)  infeccin (especialmente infecciones virales, como varicela o herpes)  enfermedad renal  enfermedad heptica  desnutrido, malnutricin  radioterapia reciente o continuada  una reaccin alrgica o inusual al fluorouracilo, a otros agentes quimioteraputicos, otros medicamentos, alimentos, colorantes o conservantes  si est embarazada o buscando quedar embarazada  si est amamantando a un beb Cmo debo utilizar este medicamento? Este medicamento se administra mediante inyeccin o infusin por va intravenosa. Lo administra un profesional de la salud calificado en un hospital o en un entorno clnico. Hable con su pediatra para informarse acerca del uso de este medicamento en nios. Puede requerir atencin  especial. Sobredosis: Pngase en contacto inmediatamente con un centro toxicolgico o una sala de urgencia si usted cree que haya tomado demasiado medicamento. ATENCIN: [employer] es solo para usted. No comparta este medicamento con nadie. Qu sucede si me olvido de una dosis? Es importante no olvidar ninguna dosis. Informe a su mdico o a su profesional de la salud si no puede asistir a Photographer. Qu puede interactuar con este medicamento?  alopurinol  cimetidina  dapsona  digoxina  hidroxiurea  leucovorina  levamisol  medicamentos para convulsiones, tales como etotona, fosfenitona, fenitona  medicamentos para incrementar los conteos sanguneos, tales como filgrastim, pegfilgrastim, sargramostim  medicamentos que tratan o previenen cogulos sanguneos, tales como warfarina, enoxaparina y dalteparina  metotrexato  metronidazol  pirimetamina  otros agentes quimioteraputicos, tales como busulfn, cisplatino, estramustina, vinblastina  trimetoprima  trimetrexato  vacunas Consulte a su mdico o a su profesional de la salud antes de tomar cualquiera de los siguientes medicamentos:  acetaminofeno  aspirina  ibuprofeno  quetoprofeno  naproxeno Puede ser que esta lista no menciona todas las posibles interacciones. Informe a su profesional de KB Home	[place] de [employer] productos a base de hierbas, medicamentos de [place] o suplementos nutritivos que est tomando. Si usted fuma, consume bebidas alcohlicas o si utiliza drogas ilegales, indqueselo tambin a su profesional de KB Home	[place]. Algunas sustancias pueden interactuar con su medicamento. A qu debo estar atento al usar Coca[employer]Cola? Visite a su mdico para chequear su evolucin peridicamente. Este medicamento puede hacerle sentir un Nurse, mental health. Esto es normal ya que la quimioterapia afecta tanto a las clulas  sanas como a las clulas cancerosas. Si presenta alguno de los [employer],  infrmelos. Sin embargo, contine con el tratamiento aun si se siente enfermo, a menos que su mdico le indique que lo suspenda. En algunos casos, podr recibir [employer] para ayudarle con los efectos secundarios. Siga las instrucciones para usarlos. Consulte a su mdico o a su profesional de la salud por asesoramiento si tiene fiebre, escalofros, dolor de garganta o cualquier otro sntoma de resfro o gripe. No se trate usted mismo. Este medicamento puede reducir la capacidad del cuerpo para combatir infecciones. Trate de no acercarse a personas que estn enfermas. [employer] puede aumentar el riesgo de magulladuras o sangrado. Consulte a su mdico o a su profesional de la salud si observa sangrados inusuales. Proceda con cuidado al cepillar sus dientes, usar hilo dental o Risk manager palillos para los dientes, ya que puede contraer una infeccin o Therapist, art con mayor facilidad. Si se somete a algn tratamiento dental, informe a su dentista que est [employer]. Evite tomar productos que contienen aspirina, acetaminofeno, ibuprofeno, naproxeno o quetoprofeno a menos que as lo indique su mdico. Estos productos pueden disimular la fiebre. No se debe quedar embarazada mientras recibe este medicamento. Las mujeres deben informar a su mdico si estn buscando quedar embarazadas o si creen que estn embarazadas. Existe la posibilidad de efectos secundarios graves a un beb sin nacer. Para ms informacin hable con su profesional de la salud o su farmacutico. No debe Economist a un beb mientras est usando este medicamento. Los hombres deben informar a su mdico si quieren tener nios. Este medicamento puede reducir el conteo de esperma. No trate la diarrea con productos de [employer]. Comunquese con su mdico si tiene diarrea que dura ms de 2 das o si es severa y [place]. Este medicamento puede aumentar la sensibilidad al sol. Mantngase fuera de Administrator. Si no lo puede  evitar, utilice ropa protectora y crema de Photographer. No utilice lmparas solares, camas solares ni cabinas solares. Qu efectos secundarios puedo tener al [employer] este medicamento? Efectos secundarios que debe informar a su mdico o a Barrister's clerk de la salud tan pronto como sea posible:  Chief of Staff como erupcin cutnea, picazn o urticarias, hinchazn de la cara, labios o lengua  conteos sanguneos bajos - este medicamento puede reducir la cantidad de glbulos blancos, glbulos rojos y plaquetas. Su riesgo de infeccin y [place].  signos de infeccin - fiebre o escalofros, tos, dolor de garganta, Social research officer, government o dificultad para orinar  signos de reduccin de plaquetas o sangrado - magulladuras, puntos rojos en la piel, heces de color oscuro o con aspecto alquitranado, sangre en la orina  signos de reduccin de glbulos rojos - cansancio o debilidad inusual, desmayos, sensacin de mareo  problemas respiratorios  cambios en la visin  dolor en el pecho  llagas en la boca  nuseas, vmito  dolor, hinchazn, enrojecimiento en el lugar de la inyeccin  hormigueo, dolor, entumecimiento de manos o pies  enrojecimiento, hinchazn o llagas en las manos o pies  dolor estomacal  sangrado inusuales Efectos secundarios que, por lo general, no requieren atencin mdica (debe informarlos a su mdico o a su profesional de la salud si persisten o si son molestos):  cambios en las uas de las manos o pies  diarrea  picazn o sequedad de la piel  cada del cabello  dolor de cabeza  prdida del apetito  sensibilidad de los ojos a la  luz  Higher education careers adviser  ojos inusualmente llorosos Puede ser que esta lista no menciona todos los posibles efectos secundarios. Comunquese a su mdico por asesoramiento mdico [employer]. Usted puede informar los efectos secundarios a la FDA por telfono al 1-800-FDA-1088. Dnde debo guardar mi  medicina? Este medicamento se administra en hospitales o clnicas y no necesitar guardarlo en su domicilio. ATENCIN: Este folleto es un resumen. Puede ser que no cubra toda la posible informacin. Si usted tiene preguntas acerca de esta medicina, consulte con su mdico, su farmacutico o su profesional de Technical sales engineer.  2020 Elsevier/Gold Standard (2014-08-31 00:00:00)

## 2019-04-15 NOTE — Progress Notes (Signed)
The patient had a port placed on 09/17. Surgical glue came off the site.  The site was fortified with Steri-Strips.  The patient was instructed to allow the Steri-Strips to remain in place until they came off on their own.  He expressed understanding and agreement with this plan.  Sandi Mealy, MHS, PA-C Physician Assistant

## 2019-04-16 ENCOUNTER — Inpatient Hospital Stay: Payer: BC Managed Care – PPO

## 2019-04-16 ENCOUNTER — Telehealth: Payer: Self-pay | Admitting: *Deleted

## 2019-04-16 ENCOUNTER — Other Ambulatory Visit: Payer: Self-pay

## 2019-04-16 VITALS — BP 122/72 | HR 70 | Temp 98.7°F | Resp 18

## 2019-04-16 DIAGNOSIS — C187 Malignant neoplasm of sigmoid colon: Secondary | ICD-10-CM | POA: Diagnosis not present

## 2019-04-16 MED ORDER — SODIUM CHLORIDE 0.9% FLUSH
10.0000 mL | INTRAVENOUS | Status: DC | PRN
  Administered 2019-04-16: 10 mL
  Filled 2019-04-16: qty 10

## 2019-04-16 MED ORDER — HEPARIN SOD (PORK) LOCK FLUSH 100 UNIT/ML IV SOLN
500.0000 [IU] | Freq: Once | INTRAVENOUS | Status: AC | PRN
  Administered 2019-04-16: 15:00:00 500 [IU]
  Filled 2019-04-16: qty 5

## 2019-04-16 NOTE — Telephone Encounter (Signed)
Patient reporting Oscar Mccarty never got his FMLA paper work back from [employer]. Requesting managed care call Ann Lions at [phone] to follow up. Notified patient that this request will be forwarded to [facility], LPN that completes and sends these forms.

## 2019-04-17 ENCOUNTER — Telehealth: Payer: Self-pay | Admitting: *Deleted

## 2019-04-17 NOTE — Telephone Encounter (Signed)
Called pt to discuss how he did with his treatment.  He reports doing OK except for feeling tired.  He denies any N/V, diarrhea/constipation.  He has experienced the cold sensitivity & accidentally drank a cold coke & states he won't do that again.  He reports that he has not heard from anyone about transportation & will need a ride in for 10/5 but thinks someone can pick him up.  Message to Dr Sherrill/Pod RN for f/u.

## 2019-04-20 ENCOUNTER — Telehealth: Payer: Self-pay | Admitting: *Deleted

## 2019-04-20 NOTE — Telephone Encounter (Signed)
VM left by Ann Lions requesting treatment plan, when are treatments, drugs administered, office notes, when last seen and when next visit will occur. 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 Johney Maine). Please reference claim [phone] when you return call.

## 2019-04-25 ENCOUNTER — Other Ambulatory Visit: Payer: Self-pay | Admitting: Oncology

## 2019-04-27 ENCOUNTER — Other Ambulatory Visit: Payer: Self-pay

## 2019-04-27 ENCOUNTER — Inpatient Hospital Stay: Payer: BC Managed Care – PPO

## 2019-04-27 ENCOUNTER — Inpatient Hospital Stay: Payer: BC Managed Care – PPO | Attending: Oncology | Admitting: Oncology

## 2019-04-27 VITALS — BP 117/71 | HR 75 | Temp 98.5°F | Resp 18 | Ht 67.0 in | Wt 163.4 lb

## 2019-04-27 DIAGNOSIS — E119 Type 2 diabetes mellitus without complications: Secondary | ICD-10-CM | POA: Diagnosis not present

## 2019-04-27 DIAGNOSIS — Z5189 Encounter for other specified aftercare: Secondary | ICD-10-CM | POA: Diagnosis not present

## 2019-04-27 DIAGNOSIS — G620 Drug-induced polyneuropathy: Secondary | ICD-10-CM | POA: Diagnosis not present

## 2019-04-27 DIAGNOSIS — C187 Malignant neoplasm of sigmoid colon: Secondary | ICD-10-CM

## 2019-04-27 DIAGNOSIS — R110 Nausea: Secondary | ICD-10-CM | POA: Insufficient documentation

## 2019-04-27 DIAGNOSIS — Z5111 Encounter for antineoplastic chemotherapy: Secondary | ICD-10-CM | POA: Insufficient documentation

## 2019-04-27 DIAGNOSIS — Z79899 Other long term (current) drug therapy: Secondary | ICD-10-CM | POA: Insufficient documentation

## 2019-04-27 DIAGNOSIS — Z95828 Presence of other vascular implants and grafts: Secondary | ICD-10-CM

## 2019-04-27 DIAGNOSIS — D701 Agranulocytosis secondary to cancer chemotherapy: Secondary | ICD-10-CM | POA: Diagnosis not present

## 2019-04-27 DIAGNOSIS — T451X5A Adverse effect of antineoplastic and immunosuppressive drugs, initial encounter: Secondary | ICD-10-CM | POA: Insufficient documentation

## 2019-04-27 LAB — CBC WITH DIFFERENTIAL (CANCER CENTER ONLY)
Abs Immature Granulocytes: 0.01 10*3/uL (ref 0.00–0.07)
Basophils Absolute: 0 10*3/uL (ref 0.0–0.1)
Basophils Relative: 1 %
Eosinophils Absolute: 0.2 10*3/uL (ref 0.0–0.5)
Eosinophils Relative: 5 %
HCT: 39.2 % (ref 39.0–52.0)
Hemoglobin: 12.8 g/dL — ABNORMAL LOW (ref 13.0–17.0)
Immature Granulocytes: 0 %
Lymphocytes Relative: 43 %
Lymphs Abs: 1.5 10*3/uL (ref 0.7–4.0)
MCH: 25.8 pg — ABNORMAL LOW (ref 26.0–34.0)
MCHC: 32.7 g/dL (ref 30.0–36.0)
MCV: 78.9 fL — ABNORMAL LOW (ref 80.0–100.0)
Monocytes Absolute: 0.5 10*3/uL (ref 0.1–1.0)
Monocytes Relative: 14 %
Neutro Abs: 1.3 10*3/uL — ABNORMAL LOW (ref 1.7–7.7)
Neutrophils Relative %: 37 %
Platelet Count: 133 10*3/uL — ABNORMAL LOW (ref 150–400)
RBC: 4.97 MIL/uL (ref 4.22–5.81)
RDW: 13.1 % (ref 11.5–15.5)
WBC Count: 3.5 10*3/uL — ABNORMAL LOW (ref 4.0–10.5)
nRBC: 0 % (ref 0.0–0.2)

## 2019-04-27 LAB — CMP (CANCER CENTER ONLY)
ALT: 21 U/L (ref 0–44)
AST: 15 U/L (ref 15–41)
Albumin: 4.1 g/dL (ref 3.5–5.0)
Alkaline Phosphatase: 67 U/L (ref 38–126)
Anion gap: 9 (ref 5–15)
BUN: 12 mg/dL (ref 6–20)
CO2: 25 mmol/L (ref 22–32)
Calcium: 8.8 mg/dL — ABNORMAL LOW (ref 8.9–10.3)
Chloride: 108 mmol/L (ref 98–111)
Creatinine: 0.82 mg/dL (ref 0.61–1.24)
GFR, Est AFR Am: >60 mL/min (ref >60)
GFR, Estimated: >60 mL/min (ref >60)
Glucose, Bld: 118 mg/dL — ABNORMAL HIGH (ref 70–99)
Potassium: 4.4 mmol/L (ref 3.5–5.1)
Sodium: 142 mmol/L (ref 135–145)
Total Bilirubin: 0.4 mg/dL (ref 0.3–1.2)
Total Protein: 7.1 g/dL (ref 6.5–8.1)

## 2019-04-27 MED ORDER — SODIUM CHLORIDE 0.9% FLUSH
10.0000 mL | INTRAVENOUS | Status: DC | PRN
  Administered 2019-04-27: 13:00:00 10 mL via INTRAVENOUS
  Filled 2019-04-27: qty 10

## 2019-04-27 MED ORDER — HEPARIN SOD (PORK) LOCK FLUSH 100 UNIT/ML IV SOLN
500.0000 [IU] | Freq: Once | INTRAVENOUS | Status: AC
  Administered 2019-04-27: 500 [IU] via INTRAVENOUS
  Filled 2019-04-27: qty 5

## 2019-04-27 MED ORDER — SODIUM CHLORIDE 0.9% FLUSH
10.0000 mL | INTRAVENOUS | Status: DC | PRN
  Administered 2019-04-27: 10 mL
  Filled 2019-04-27: qty 10

## 2019-04-27 NOTE — Progress Notes (Signed)
  [place] OFFICE PROGRESS NOTE   Diagnosis: Colon cancer  INTERVAL HISTORY:   He completed cycle 1 FOLFOX beginning 04/14/2019.  He reports mild nausea and cold sensitivity on day 4.  No neuropathy symptoms at present.  No mouth sores.  No hand or foot pain.  Objective:  Vital signs in last 24 hours:  Blood pressure 117/71, pulse 75, temperature 98.5 F (36.9 C), temperature source Temporal, resp. rate 18, height '5\' 7"'$  (1.702 m), weight 163 lb 6.4 oz (74.1 kg), SpO2 100 %.    HEENT: No thrush or ulcers GI: Nontender, no hepatomegaly, healed surgical incisions Vascular: No leg edema  Skin: Palms without erythema  Portacath/PICC-without erythema  Lab Results:  Lab Results  Component Value Date   WBC 3.5 (L) 04/27/2019   HGB 12.8 (L) 04/27/2019   HCT 39.2 04/27/2019   MCV 78.9 (L) 04/27/2019   PLT 133 (L) 04/27/2019   NEUTROABS 1.3 (L) 04/27/2019    CMP  Lab Results  Component Value Date   NA 142 04/27/2019   K 4.4 04/27/2019   CL 108 04/27/2019   CO2 25 04/27/2019   GLUCOSE 118 (H) 04/27/2019   BUN 12 04/27/2019   CREATININE 0.82 04/27/2019   CALCIUM 8.8 (L) 04/27/2019   PROT 7.1 04/27/2019   ALBUMIN 4.1 04/27/2019   AST 15 04/27/2019   ALT 21 04/27/2019   ALKPHOS 67 04/27/2019   BILITOT 0.4 04/27/2019   GFRNONAA >60 04/27/2019   GFRAA >60 04/27/2019   Medications: I have reviewed the patient's current medications.   Assessment/Plan: 1. 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 Microsatellite stable, no loss of mismatch repair protein expression ? CTs 12/24/2018- sigmoid: Wall thickening, multiple enlarged nodes adjacent to the sigmoid colon, no evidence of metastatic disease ? Cycle 1 FOLFOX 04/14/2019  2. COVID-19 respiratory infection June 2020 3. Diabetes diagnosed June 2020 4. Port-A-Cath placement, Dr. Johney Maine, 04/09/2019    Disposition: Mr. Laker is completed 1 cycle of  FOLFOX.  He tolerated the chemotherapy well.  He has mild neutropenia.  We discussed the risk of proceeding with chemotherapy.  He understands the risk of infection.  We discussed proceeding with FOLFOX, adding Udenyca, and holding oxaliplatin with this cycle.  He is not comfortable proceeding with either FOLFOX or 5-fluorouracil today.  We were unable to get insurance approval for Udenyca.Marland Kitchen  He will return for a repeat CBC and cycle 2 FOLFOX on 04/30/2019.  He will call for any fever or symptoms of infection.  Ellen Henri will be added with cycle 2.  We reviewed potential toxicities associated with the Udenyca including the chance for bone pain, rash, and rupture of the spleen.  He agrees to proceed.  Betsy Coder, MD  04/27/2019  11:47 AM

## 2019-04-29 ENCOUNTER — Telehealth: Payer: Self-pay | Admitting: *Deleted

## 2019-04-29 ENCOUNTER — Inpatient Hospital Stay: Payer: BC Managed Care – PPO

## 2019-04-29 ENCOUNTER — Telehealth: Payer: Self-pay | Admitting: Nurse Practitioner

## 2019-04-29 NOTE — Telephone Encounter (Signed)
Patient called in inquire if the Oscar Mccarty was approved so he can have treatment tomorrow. Also asking what time he is due to come tomorrow, since he has not been called. Informed him that it was approved and chemo appointment is at 1:45 pm (does not need the lab/flush since these were on 10/8). Dr. Benay Spice said OK to treat with low ANC since he is getting the Udenyca. Patient is requesting chemo earlier in the day. Informed him that scheduling will be notified of his request. Sent high priority scheduling message with his request.

## 2019-04-29 NOTE — Telephone Encounter (Signed)
Called pt per 10/7 sch message to confirm appt . Pt is aware and labs cancelled .

## 2019-04-30 ENCOUNTER — Inpatient Hospital Stay: Payer: BC Managed Care – PPO

## 2019-04-30 ENCOUNTER — Other Ambulatory Visit: Payer: Self-pay

## 2019-04-30 ENCOUNTER — Telehealth: Payer: Self-pay | Admitting: *Deleted

## 2019-04-30 VITALS — BP 124/79 | HR 83 | Temp 98.7°F | Resp 18

## 2019-04-30 DIAGNOSIS — C187 Malignant neoplasm of sigmoid colon: Secondary | ICD-10-CM

## 2019-04-30 LAB — CBC WITH DIFFERENTIAL (CANCER CENTER ONLY)
Abs Immature Granulocytes: 0 10*3/uL (ref 0.00–0.07)
Basophils Absolute: 0 10*3/uL (ref 0.0–0.1)
Basophils Relative: 1 %
Eosinophils Absolute: 0.1 10*3/uL (ref 0.0–0.5)
Eosinophils Relative: 4 %
HCT: 39.3 % (ref 39.0–52.0)
Hemoglobin: 12.7 g/dL — ABNORMAL LOW (ref 13.0–17.0)
Immature Granulocytes: 0 %
Lymphocytes Relative: 44 %
Lymphs Abs: 1.6 10*3/uL (ref 0.7–4.0)
MCH: 26.3 pg (ref 26.0–34.0)
MCHC: 32.3 g/dL (ref 30.0–36.0)
MCV: 81.5 fL (ref 80.0–100.0)
Monocytes Absolute: 0.4 10*3/uL (ref 0.1–1.0)
Monocytes Relative: 12 %
Neutro Abs: 1.4 10*3/uL — ABNORMAL LOW (ref 1.7–7.7)
Neutrophils Relative %: 39 %
Platelet Count: 162 10*3/uL (ref 150–400)
RBC: 4.82 MIL/uL (ref 4.22–5.81)
RDW: 13.4 % (ref 11.5–15.5)
WBC Count: 3.6 10*3/uL — ABNORMAL LOW (ref 4.0–10.5)
nRBC: 0 % (ref 0.0–0.2)

## 2019-04-30 MED ORDER — DEXTROSE 5 % IV SOLN
Freq: Once | INTRAVENOUS | Status: AC
  Administered 2019-04-30: 15:00:00 via INTRAVENOUS
  Filled 2019-04-30: qty 250

## 2019-04-30 MED ORDER — SODIUM CHLORIDE 0.9 % IV SOLN
2400.0000 mg/m2 | INTRAVENOUS | Status: DC
  Administered 2019-04-30: 18:00:00 4500 mg via INTRAVENOUS
  Filled 2019-04-30: qty 90

## 2019-04-30 MED ORDER — PALONOSETRON HCL INJECTION 0.25 MG/5ML
INTRAVENOUS | Status: AC
  Filled 2019-04-30: qty 5

## 2019-04-30 MED ORDER — OXALIPLATIN CHEMO INJECTION 100 MG/20ML
85.0000 mg/m2 | Freq: Once | INTRAVENOUS | Status: AC
  Administered 2019-04-30: 16:00:00 160 mg via INTRAVENOUS
  Filled 2019-04-30: qty 32

## 2019-04-30 MED ORDER — DEXTROSE 5 % IV SOLN
Freq: Once | INTRAVENOUS | Status: AC
  Administered 2019-04-30: 16:00:00 via INTRAVENOUS
  Filled 2019-04-30: qty 250

## 2019-04-30 MED ORDER — DEXAMETHASONE SODIUM PHOSPHATE 10 MG/ML IJ SOLN
10.0000 mg | Freq: Once | INTRAMUSCULAR | Status: AC
  Administered 2019-04-30: 15:00:00 10 mg via INTRAVENOUS

## 2019-04-30 MED ORDER — PALONOSETRON HCL INJECTION 0.25 MG/5ML
0.2500 mg | Freq: Once | INTRAVENOUS | Status: AC
  Administered 2019-04-30: 15:00:00 0.25 mg via INTRAVENOUS

## 2019-04-30 MED ORDER — DEXAMETHASONE SODIUM PHOSPHATE 10 MG/ML IJ SOLN
INTRAMUSCULAR | Status: AC
  Filled 2019-04-30: qty 1

## 2019-04-30 MED ORDER — LEUCOVORIN CALCIUM INJECTION 350 MG
400.0000 mg/m2 | Freq: Once | INTRAVENOUS | Status: AC
  Administered 2019-04-30: 16:00:00 748 mg via INTRAVENOUS
  Filled 2019-04-30: qty 37.4

## 2019-04-30 MED ORDER — FLUOROURACIL CHEMO INJECTION 2.5 GM/50ML
400.0000 mg/m2 | Freq: Once | INTRAVENOUS | Status: AC
  Administered 2019-04-30: 18:00:00 750 mg via INTRAVENOUS
  Filled 2019-04-30: qty 15

## 2019-04-30 NOTE — Patient Instructions (Signed)
[facility] Discharge Instructions for Patients Receiving Chemotherapy  Today you received the following chemotherapy agents: Oxaliplatin/Leucovorin/5FU  To help prevent nausea and vomiting after your treatment, we encourage you to take your nausea medication as directed.   If you develop nausea and vomiting that is not controlled by your nausea medication, call the clinic.   BELOW ARE SYMPTOMS THAT SHOULD BE REPORTED IMMEDIATELY:  *FEVER GREATER THAN 100.5 F  *CHILLS WITH OR WITHOUT FEVER  NAUSEA AND VOMITING THAT IS NOT CONTROLLED WITH YOUR NAUSEA MEDICATION  *UNUSUAL SHORTNESS OF BREATH  *UNUSUAL BRUISING OR BLEEDING  TENDERNESS IN MOUTH AND THROAT WITH OR WITHOUT PRESENCE OF ULCERS  *URINARY PROBLEMS  *BOWEL PROBLEMS  UNUSUAL RASH Items with * indicate a potential emergency and should be followed up as soon as possible.  Feel free to call the clinic should you have any questions or concerns. The clinic phone number is [phone].  Please show the [facility] at check-in to the Emergency Department and triage nurse.  Oxaliplatin Injection Qu es este medicamento? El OXALIPLATINO es un agente quimioteraputico. Este medicamento acta sobre las clulas que se dividen rpidamente, como las clulas cancerosas, y finalmente provoca la muerte de estas clulas. Se utiliza en el tratamiento del cncer de colon y recto y 23 tipos de cncer. Este medicamento puede ser utilizado para otros usos; si tiene alguna pregunta consulte con su proveedor de atencin mdica o con su farmacutico. MARCAS COMUNES: Eloxatin Qu le debo informar a mi profesional de la salud antes de tomar este medicamento? Necesita saber si usted presenta alguno de los siguientes problemas o situaciones:  enfermedad renal  una reaccin alrgica o inusual al oxaliplatino, a otros agentes quimioteraputicos, a otros medicamentos, alimentos, colorantes o conservantes  si est  embarazada o buscando quedar embarazada  si est amamantando a un beb Cmo debo utilizar este medicamento? Este medicamento se administra mediante infusin por va intravenosa. Lo administra un profesional de la salud calificado en un hospital o en un entorno clnico. Hable con su pediatra para informarse acerca del uso de este medicamento en nios. Puede requerir atencin especial. Sobredosis: Pngase en contacto inmediatamente con un centro toxicolgico o una sala de urgencia si usted cree que haya tomado demasiado medicamento. ATENCIN: [employer] es solo para usted. No comparta este medicamento con nadie. Qu sucede si me olvido de una dosis? Es importante no olvidar ninguna dosis. Informe a su mdico o a su profesional de la salud si no puede asistir a Photographer. Qu puede interactuar con este medicamento?  medicamentos para incrementar los conteos sanguneos, tales como filgrastim, pegfilgrastim, sargramostim  probenecid  ciertos antibiticos, tales como amicacina, gentamicina, neomicina, polimixina B, estreptomicina, tobramicina  zalcitabina Consulte a su mdico o a su profesional de la salud antes de tomar cualquiera de los siguientes medicamentos:  acetaminofeno  aspirina  ibuprofeno  quetoprofeno  naproxeno Puede ser que esta lista no menciona todas las posibles interacciones. Informe a su profesional de KB Home	[place] de [employer] productos a base de hierbas, medicamentos de [place] o suplementos nutritivos que est tomando. Si usted fuma, consume bebidas alcohlicas o si utiliza drogas ilegales, indqueselo tambin a su profesional de KB Home	[place]. Algunas sustancias pueden interactuar con su medicamento. A qu debo estar atento al usar [employer]? Se supervisar su condicin atentamente mientras reciba este medicamento. Tendr que hacerse anlisis de sangre peridicos mientras reciba este medicamento. Este medicamento puede aumentar su sensibilidad al fro. No  tomar bebidas fras  o usar hielo. Cubra la piel expuesta antes de estar en contacto con temperaturas fras u objetos fros. Mientras se encuentra afuera cuando haga fro use ropa [place] su boca y su nariz para calentar el aire que entra en sus pulmones. Informe a su mdico si experimenta sensibilidad al fro. Este medicamento puede hacerle sentir un Nurse, mental health. Esto es normal ya que la quimioterapia afecta tanto a las clulas sanas como a las clulas cancerosas. Si presenta alguno de los [employer], infrmelos. Sin embargo, contine con el tratamiento aun si se siente enfermo, a menos que su mdico le indique que lo suspenda. En algunos casos, podr recibir [employer] para ayudarle con los efectos secundarios. Siga las instrucciones para usarlos. Consulte a su mdico o a su profesional de la salud por asesoramiento si tiene fiebre, escalofros, dolor de garganta o cualquier otro sntoma de resfro o gripe. No se trate usted mismo. Este medicamento puede reducir la capacidad del cuerpo para combatir infecciones. Trate de no acercarse a personas que estn enfermas. [employer] puede aumentar el riesgo de magulladuras o sangrado. Consulte a su mdico o a su profesional de la salud si observa sangrados inusuales. Proceda con cuidado al cepillar sus dientes, usar hilo dental o Risk manager palillos para los dientes, ya que puede contraer una infeccin o Therapist, art con mayor facilidad. Si se somete a algn tratamiento dental, informe a su dentista que est [employer]. Evite tomar productos que contienen aspirina, acetaminofeno, ibuprofeno, naproxeno o quetoprofeno a menos que as lo indique su mdico. Estos productos pueden disimular la fiebre. No se debe quedar embarazada mientras reciba este medicamento. Las mujeres deben informar a su mdico si estn buscando quedar embarazadas o si creen que estn embarazadas. Existe la posibilidad de efectos secundarios graves  a un beb sin nacer. Para ms informacin hable con su profesional de la salud o su farmacutico. No debe Economist a un beb mientras reciba este medicamento. Si tiene diarrea, llame a su mdico o a su profesional de KB Home	[place]. No se trate usted mismo. Qu efectos secundarios puedo tener al [employer] este medicamento? Efectos secundarios que debe informar a su mdico o a Barrister's clerk de la salud tan pronto como sea posible:  Chief of Staff como erupcin cutnea, picazn o urticarias, hinchazn de la cara, labios o lengua  conteos sanguneos bajos - este medicamento puede reducir la cantidad de glbulos blancos, glbulos rojos y plaquetas. Su riesgo de infeccin y [place].  signos de infeccin - fiebre o escalofros, tos, dolor de garganta, Social research officer, government o dificultad para orinar  signos de reduccin de plaquetas o sangrado - magulladuras, puntos rojos en la piel, heces de color oscuro o con aspecto alquitranado, sangrado por la nariz  signos de reduccin de glbulos rojos - cansancio o debilidad inusual, desmayos, sensacin de Lobbyist u opresin en el pecho  tos  diarrea  tensin en la mandbula  llagas en la boca  nuseas, vmito  dolor, hinchazn, enrojecimiento o irritacin en el lugar de la inyeccin  dolor, hormigueo, entumecimiento de manos o pies  problemas de coordinacin, del habla, al caminar  enrojecimiento, formacin de ampollas, descamacin o distensin de la piel, inclusive dentro de la boca  dificultad para orinar o cambios en el volumen de orina Efectos secundarios que, por lo general, no requieren atencin mdica (debe informarlos a su mdico o a su profesional de la salud si persisten o si son molestos):  cambios en la  visin  estreimiento  cada del cabello  prdida del apetito  sabor metlico en la boca o cambios en el sentido del gusto  dolor estomacal Puede ser que esta lista no menciona todos los  posibles efectos secundarios. Comunquese a su mdico por asesoramiento mdico [employer] Usted puede informar los efectos secundarios a la FDA por telfono al 1-800-FDA-1088. Dnde debo guardar mi medicina? Este medicamento se administra en hospitales o clnicas y no necesitar guardarlo en su domicilio. ATENCIN: Este folleto es un resumen. Puede ser que no cubra toda la posible informacin. Si usted tiene preguntas acerca de esta medicina, consulte con su mdico, su farmacutico o su profesional de Technical sales engineer.  2020 Elsevier/Gold Standard (2014-08-31 00:00:00)  Leucovorin injection Qu es este medicamento? La LEUCOVORINA se [place] para prevenir o tratar [employer] nocivos de ciertos medicamentos. Este medicamento tambin sirve para tratar la anemia provocada por una nivel bajo de cido flico en el cuerpo. Tambin se puede administrar con 5-fluorouracilo (5-FU), para tratar el cncer de colon. Este medicamento puede ser utilizado para otros usos; si tiene alguna pregunta consulte con su proveedor de atencin mdica o con su farmacutico. Qu le debo informar a mi profesional de la salud antes de tomar este medicamento? Necesita saber si usted presenta alguno de los siguientes problemas o situaciones:  anemia debido a bajos niveles de vitamina B-12 en la sangre  una reaccin alrgica o inusual a la leucovorina, cido flico, a otros medicamentos, alimentos, colorantes o conservantes  si est embarazada o buscando quedar embarazada  si est amamantando a un beb Cmo debo utilizar este medicamento? Este medicamento se administra mediante inyeccin por va intramuscular o intravenosa. Lo administra un profesional de Technical sales engineer en un hospital o en un entorno clnico. Hable con su pediatra para informarse acerca del uso de este medicamento en nios. Puede requerir atencin especial. Sobredosis: Pngase en contacto inmediatamente con un centro toxicolgico o una sala de  urgencia si usted cree que haya tomado demasiado medicamento. ATENCIN: [employer] es solo para usted. No comparta este medicamento con nadie. Qu sucede si me olvido de una dosis? No se aplica en este caso. Qu puede interactuar con este medicamento?  capecitabina  fluorouracilo  fenobarbital  fenitona  primidona  trimetoprima- sulfametoxasol Puede ser que esta lista no menciona todas las posibles interacciones. Informe a su profesional de KB Home	[place] de [employer] productos a base de hierbas, medicamentos de [place] o suplementos nutritivos que est tomando. Si usted fuma, consume bebidas alcohlicas o si utiliza drogas ilegales, indqueselo tambin a su profesional de KB Home	[place]. Algunas sustancias pueden interactuar con su medicamento. A qu debo estar atento al usar [employer]? Se supervisar su estado de salud atentamente mientras reciba este medicamento. Este medicamento puede aumentar los efectos secundarios de 5-fluorouracilo, 5-FU. Si tiene diarrea o [employer] boca que no mejoran o que [place], consulte a su mdico o a su profesional de KB Home	[place]. Qu efectos secundarios puedo tener al [employer] este medicamento? Efectos secundarios que debe informar a su mdico o a Barrister's clerk de la salud tan pronto como sea posible:  Chief of Staff como erupcin cutnea, picazn o urticarias, hinchazn de la cara, labios o lengua  problemas respiratorios  fiebre, infeccin  llagas en la boca  sangrado, magulladuras inusuales  cansancio o debilidad inusual Efectos secundarios que, por lo general, no requieren atencin mdica (debe informarlos a su mdico o a su profesional de la salud si persisten o si son  molestos):  estreimiento o diarrea  prdida del apetito  nuseas, vmito Puede ser que esta lista no menciona todos los posibles efectos secundarios. Comunquese a su mdico por asesoramiento mdico [employer]. Usted puede informar  los efectos secundarios a la FDA por telfono al 1-800-FDA-1088. Dnde debo guardar mi medicina? Este medicamento se administra en hospitales o clnicas y no necesitar guardarlo en su domicilio. ATENCIN: Este folleto es un resumen. Puede ser que no cubra toda la posible informacin. Si usted tiene preguntas acerca de esta medicina, consulte con su mdico, su farmacutico o su profesional de Technical sales engineer.  2020 Elsevier/Gold Standard (2014-08-31 00:00:00)  Fluorouracil, 5-FU injection Qu es este medicamento? El [place], 5-FU es un agente quimioteraputico. Este medicamento reduce el crecimiento de las clulas cancerosas. Se utiliza en el tratamiento de muchos tipos de cncer, incluyendo el cncer de mama, de colon y recto, pancretico y de Paramedic. Este medicamento puede ser utilizado para otros usos; si tiene alguna pregunta consulte con su proveedor de atencin mdica o con su farmacutico. MARCAS COMUNES: [place] Qu le debo informar a mi profesional de la salud antes de tomar este medicamento? Necesita saber si usted presenta alguno de los siguientes problemas o situaciones:  trastornos sanguneos  deficiencia de la dihidropirimidina deshidrogenasa (DPD)  infeccin (especialmente infecciones virales, como varicela o herpes)  enfermedad renal  enfermedad heptica  desnutrido, malnutricin  radioterapia reciente o continuada  una reaccin alrgica o inusual al fluorouracilo, a otros agentes quimioteraputicos, otros medicamentos, alimentos, colorantes o conservantes  si est embarazada o buscando quedar embarazada  si est amamantando a un beb Cmo debo utilizar este medicamento? Este medicamento se administra mediante inyeccin o infusin por va intravenosa. Lo administra un profesional de la salud calificado en un hospital o en un entorno clnico. Hable con su pediatra para informarse acerca del uso de este medicamento en nios. Puede requerir atencin  especial. Sobredosis: Pngase en contacto inmediatamente con un centro toxicolgico o una sala de urgencia si usted cree que haya tomado demasiado medicamento. ATENCIN: [employer] es solo para usted. No comparta este medicamento con nadie. Qu sucede si me olvido de una dosis? Es importante no olvidar ninguna dosis. Informe a su mdico o a su profesional de la salud si no puede asistir a Photographer. Qu puede interactuar con este medicamento?  alopurinol  cimetidina  dapsona  digoxina  hidroxiurea  leucovorina  levamisol  medicamentos para convulsiones, tales como etotona, fosfenitona, fenitona  medicamentos para incrementar los conteos sanguneos, tales como filgrastim, pegfilgrastim, sargramostim  medicamentos que tratan o previenen cogulos sanguneos, tales como warfarina, enoxaparina y dalteparina  metotrexato  metronidazol  pirimetamina  otros agentes quimioteraputicos, tales como busulfn, cisplatino, estramustina, vinblastina  trimetoprima  trimetrexato  vacunas Consulte a su mdico o a su profesional de la salud antes de tomar cualquiera de los siguientes medicamentos:  acetaminofeno  aspirina  ibuprofeno  quetoprofeno  naproxeno Puede ser que esta lista no menciona todas las posibles interacciones. Informe a su profesional de KB Home	[place] de [employer] productos a base de hierbas, medicamentos de [place] o suplementos nutritivos que est tomando. Si usted fuma, consume bebidas alcohlicas o si utiliza drogas ilegales, indqueselo tambin a su profesional de KB Home	[place]. Algunas sustancias pueden interactuar con su medicamento. A qu debo estar atento al usar [employer]? Visite a su mdico para chequear su evolucin peridicamente. Este medicamento puede hacerle sentir un Nurse, mental health. Esto es normal ya que la quimioterapia afecta tanto a las clulas  sanas como a las clulas cancerosas. Si presenta alguno de los [employer],  infrmelos. Sin embargo, contine con el tratamiento aun si se siente enfermo, a menos que su mdico le indique que lo suspenda. En algunos casos, podr recibir [employer] para ayudarle con los efectos secundarios. Siga las instrucciones para usarlos. Consulte a su mdico o a su profesional de la salud por asesoramiento si tiene fiebre, escalofros, dolor de garganta o cualquier otro sntoma de resfro o gripe. No se trate usted mismo. Este medicamento puede reducir la capacidad del cuerpo para combatir infecciones. Trate de no acercarse a personas que estn enfermas. [employer] puede aumentar el riesgo de magulladuras o sangrado. Consulte a su mdico o a su profesional de la salud si observa sangrados inusuales. Proceda con cuidado al cepillar sus dientes, usar hilo dental o Risk manager palillos para los dientes, ya que puede contraer una infeccin o Therapist, art con mayor facilidad. Si se somete a algn tratamiento dental, informe a su dentista que est [employer]. Evite tomar productos que contienen aspirina, acetaminofeno, ibuprofeno, naproxeno o quetoprofeno a menos que as lo indique su mdico. Estos productos pueden disimular la fiebre. No se debe quedar embarazada mientras recibe este medicamento. Las mujeres deben informar a su mdico si estn buscando quedar embarazadas o si creen que estn embarazadas. Existe la posibilidad de efectos secundarios graves a un beb sin nacer. Para ms informacin hable con su profesional de la salud o su farmacutico. No debe Economist a un beb mientras est usando este medicamento. Los hombres deben informar a su mdico si quieren tener nios. Este medicamento puede reducir el conteo de esperma. No trate la diarrea con productos de [employer]. Comunquese con su mdico si tiene diarrea que dura ms de 2 das o si es severa y [place]. Este medicamento puede aumentar la sensibilidad al sol. Mantngase fuera de Administrator. Si no lo puede  evitar, utilice ropa protectora y crema de Photographer. No utilice lmparas solares, camas solares ni cabinas solares. Qu efectos secundarios puedo tener al [employer] este medicamento? Efectos secundarios que debe informar a su mdico o a Barrister's clerk de la salud tan pronto como sea posible:  Chief of Staff como erupcin cutnea, picazn o urticarias, hinchazn de la cara, labios o lengua  conteos sanguneos bajos - este medicamento puede reducir la cantidad de glbulos blancos, glbulos rojos y plaquetas. Su riesgo de infeccin y [place].  signos de infeccin - fiebre o escalofros, tos, dolor de garganta, Social research officer, government o dificultad para orinar  signos de reduccin de plaquetas o sangrado - magulladuras, puntos rojos en la piel, heces de color oscuro o con aspecto alquitranado, sangre en la orina  signos de reduccin de glbulos rojos - cansancio o debilidad inusual, desmayos, sensacin de mareo  problemas respiratorios  cambios en la visin  dolor en el pecho  llagas en la boca  nuseas, vmito  dolor, hinchazn, enrojecimiento en el lugar de la inyeccin  hormigueo, dolor, entumecimiento de manos o pies  enrojecimiento, hinchazn o llagas en las manos o pies  dolor estomacal  sangrado inusuales Efectos secundarios que, por lo general, no requieren atencin mdica (debe informarlos a su mdico o a su profesional de la salud si persisten o si son molestos):  cambios en las uas de las manos o pies  diarrea  picazn o sequedad de la piel  cada del cabello  dolor de cabeza  prdida del apetito  sensibilidad de los ojos a la  luz  Higher education careers adviser  ojos inusualmente llorosos Puede ser que esta lista no menciona todos los posibles efectos secundarios. Comunquese a su mdico por asesoramiento mdico [employer]. Usted puede informar los efectos secundarios a la FDA por telfono al 1-800-FDA-1088. Dnde debo guardar mi  medicina? Este medicamento se administra en hospitales o clnicas y no necesitar guardarlo en su domicilio. ATENCIN: Este folleto es un resumen. Puede ser que no cubra toda la posible informacin. Si usted tiene preguntas acerca de esta medicina, consulte con su mdico, su farmacutico o su profesional de Technical sales engineer.  2020 Elsevier/Gold Standard (2014-08-31 00:00:00)

## 2019-04-30 NOTE — Progress Notes (Signed)
Per Dr. Benay Spice patient to have cbc with results before chemotherapy today.

## 2019-04-30 NOTE — Telephone Encounter (Signed)
Case worker called to request all office notes from 1st visit to last visit. Asking for chemo treatment schedule and drugs he will receive and length of treatment. She is trying to extend his STD out past 05/23/2019. Informed her he is receiving FOLFOX every 2 weeks for 6 months (12 cycles) with 1st tx 04/15/2019. This is curative intent as well. Records faxed as requested to [phone].

## 2019-04-30 NOTE — Progress Notes (Signed)
Per Dr. Benay Spice, okay to tx today with [place] 1.4.

## 2019-05-02 ENCOUNTER — Other Ambulatory Visit: Payer: Self-pay

## 2019-05-02 ENCOUNTER — Inpatient Hospital Stay: Payer: BC Managed Care – PPO

## 2019-05-02 VITALS — BP 120/81 | HR 69 | Temp 98.5°F | Resp 18

## 2019-05-02 DIAGNOSIS — Z95828 Presence of other vascular implants and grafts: Secondary | ICD-10-CM

## 2019-05-02 DIAGNOSIS — C187 Malignant neoplasm of sigmoid colon: Secondary | ICD-10-CM

## 2019-05-02 MED ORDER — HEPARIN SOD (PORK) LOCK FLUSH 100 UNIT/ML IV SOLN
500.0000 [IU] | Freq: Once | INTRAVENOUS | Status: AC | PRN
  Administered 2019-05-02: 500 [IU]
  Filled 2019-05-02: qty 5

## 2019-05-02 MED ORDER — PEGFILGRASTIM-CBQV 6 MG/0.6ML ~~LOC~~ SOSY
6.0000 mg | PREFILLED_SYRINGE | Freq: Once | SUBCUTANEOUS | Status: AC
  Administered 2019-05-02: 6 mg via SUBCUTANEOUS

## 2019-05-02 MED ORDER — SODIUM CHLORIDE 0.9% FLUSH
10.0000 mL | INTRAVENOUS | Status: DC | PRN
  Administered 2019-05-02: 10 mL
  Filled 2019-05-02: qty 10

## 2019-05-02 NOTE — Patient Instructions (Signed)

## 2019-05-06 ENCOUNTER — Telehealth: Payer: Self-pay | Admitting: Oncology

## 2019-05-06 NOTE — Telephone Encounter (Signed)
Called pt per 10/13 sch message - unable to reach pt left message for patient to call back if r/s needed.

## 2019-05-10 ENCOUNTER — Other Ambulatory Visit: Payer: Self-pay | Admitting: Oncology

## 2019-05-11 ENCOUNTER — Ambulatory Visit: Payer: BC Managed Care – PPO | Admitting: Oncology

## 2019-05-11 ENCOUNTER — Ambulatory Visit: Payer: BC Managed Care – PPO

## 2019-05-11 ENCOUNTER — Other Ambulatory Visit: Payer: BC Managed Care – PPO

## 2019-05-14 ENCOUNTER — Inpatient Hospital Stay (HOSPITAL_BASED_OUTPATIENT_CLINIC_OR_DEPARTMENT_OTHER): Payer: BC Managed Care – PPO | Admitting: Nurse Practitioner

## 2019-05-14 ENCOUNTER — Inpatient Hospital Stay: Payer: BC Managed Care – PPO

## 2019-05-14 ENCOUNTER — Encounter: Payer: Self-pay | Admitting: Nurse Practitioner

## 2019-05-14 ENCOUNTER — Other Ambulatory Visit: Payer: Self-pay

## 2019-05-14 VITALS — BP 126/86 | HR 63 | Temp 98.3°F | Resp 17 | Ht 67.0 in | Wt 166.6 lb

## 2019-05-14 DIAGNOSIS — C187 Malignant neoplasm of sigmoid colon: Secondary | ICD-10-CM

## 2019-05-14 DIAGNOSIS — R519 Headache, unspecified: Secondary | ICD-10-CM

## 2019-05-14 DIAGNOSIS — Z95828 Presence of other vascular implants and grafts: Secondary | ICD-10-CM

## 2019-05-14 LAB — CMP (CANCER CENTER ONLY)
ALT: 27 U/L (ref 0–44)
AST: 19 U/L (ref 15–41)
Albumin: 3.8 g/dL (ref 3.5–5.0)
Alkaline Phosphatase: 131 U/L — ABNORMAL HIGH (ref 38–126)
Anion gap: 9 (ref 5–15)
BUN: 14 mg/dL (ref 6–20)
CO2: 25 mmol/L (ref 22–32)
Calcium: 8.8 mg/dL — ABNORMAL LOW (ref 8.9–10.3)
Chloride: 105 mmol/L (ref 98–111)
Creatinine: 0.8 mg/dL (ref 0.61–1.24)
GFR, Est AFR Am: >60 mL/min (ref >60)
GFR, Estimated: >60 mL/min (ref >60)
Glucose, Bld: 129 mg/dL — ABNORMAL HIGH (ref 70–99)
Potassium: 4.1 mmol/L (ref 3.5–5.1)
Sodium: 139 mmol/L (ref 135–145)
Total Bilirubin: 0.2 mg/dL — ABNORMAL LOW (ref 0.3–1.2)
Total Protein: 7.1 g/dL (ref 6.5–8.1)

## 2019-05-14 LAB — CBC WITH DIFFERENTIAL (CANCER CENTER ONLY)
Abs Immature Granulocytes: 0.31 10*3/uL — ABNORMAL HIGH (ref 0.00–0.07)
Basophils Absolute: 0.1 10*3/uL (ref 0.0–0.1)
Basophils Relative: 1 %
Eosinophils Absolute: 0.1 10*3/uL (ref 0.0–0.5)
Eosinophils Relative: 1 %
HCT: 40.3 % (ref 39.0–52.0)
Hemoglobin: 13.2 g/dL (ref 13.0–17.0)
Immature Granulocytes: 3 %
Lymphocytes Relative: 20 %
Lymphs Abs: 2.1 10*3/uL (ref 0.7–4.0)
MCH: 25.6 pg — ABNORMAL LOW (ref 26.0–34.0)
MCHC: 32.8 g/dL (ref 30.0–36.0)
MCV: 78.3 fL — ABNORMAL LOW (ref 80.0–100.0)
Monocytes Absolute: 1 10*3/uL (ref 0.1–1.0)
Monocytes Relative: 9 %
Neutro Abs: 7 10*3/uL (ref 1.7–7.7)
Neutrophils Relative %: 66 %
Platelet Count: 92 10*3/uL — ABNORMAL LOW (ref 150–400)
RBC: 5.15 MIL/uL (ref 4.22–5.81)
RDW: 14.1 % (ref 11.5–15.5)
WBC Count: 10.5 10*3/uL (ref 4.0–10.5)
nRBC: 0.2 % (ref 0.0–0.2)

## 2019-05-14 MED ORDER — SODIUM CHLORIDE 0.9 % IV SOLN
2400.0000 mg/m2 | INTRAVENOUS | Status: DC
  Administered 2019-05-14: 4500 mg via INTRAVENOUS
  Filled 2019-05-14: qty 90

## 2019-05-14 MED ORDER — PALONOSETRON HCL INJECTION 0.25 MG/5ML
INTRAVENOUS | Status: AC
  Filled 2019-05-14: qty 5

## 2019-05-14 MED ORDER — FLUOROURACIL CHEMO INJECTION 2.5 GM/50ML
400.0000 mg/m2 | Freq: Once | INTRAVENOUS | Status: AC
  Administered 2019-05-14: 750 mg via INTRAVENOUS
  Filled 2019-05-14: qty 15

## 2019-05-14 MED ORDER — DEXAMETHASONE SODIUM PHOSPHATE 10 MG/ML IJ SOLN
INTRAMUSCULAR | Status: AC
  Filled 2019-05-14: qty 1

## 2019-05-14 MED ORDER — SODIUM CHLORIDE 0.9% FLUSH
10.0000 mL | INTRAVENOUS | Status: DC | PRN
  Administered 2019-05-14: 10 mL
  Filled 2019-05-14: qty 10

## 2019-05-14 MED ORDER — ACETAMINOPHEN 325 MG PO TABS
ORAL_TABLET | ORAL | Status: AC
  Filled 2019-05-14: qty 2

## 2019-05-14 MED ORDER — HEPARIN SOD (PORK) LOCK FLUSH 100 UNIT/ML IV SOLN
500.0000 [IU] | Freq: Once | INTRAVENOUS | Status: DC | PRN
  Filled 2019-05-14: qty 5

## 2019-05-14 MED ORDER — SODIUM CHLORIDE 0.9% FLUSH
10.0000 mL | INTRAVENOUS | Status: DC | PRN
  Filled 2019-05-14: qty 10

## 2019-05-14 MED ORDER — LEUCOVORIN CALCIUM INJECTION 350 MG
400.0000 mg/m2 | Freq: Once | INTRAVENOUS | Status: AC
  Administered 2019-05-14: 748 mg via INTRAVENOUS
  Filled 2019-05-14: qty 37.4

## 2019-05-14 MED ORDER — DEXAMETHASONE SODIUM PHOSPHATE 10 MG/ML IJ SOLN
10.0000 mg | Freq: Once | INTRAMUSCULAR | Status: AC
  Administered 2019-05-14: 10 mg via INTRAVENOUS

## 2019-05-14 MED ORDER — ACETAMINOPHEN 325 MG PO TABS
650.0000 mg | ORAL_TABLET | Freq: Once | ORAL | Status: AC
  Administered 2019-05-14: 650 mg via ORAL

## 2019-05-14 MED ORDER — DEXTROSE 5 % IV SOLN
Freq: Once | INTRAVENOUS | Status: AC
  Administered 2019-05-14: 13:00:00 via INTRAVENOUS
  Filled 2019-05-14: qty 250

## 2019-05-14 MED ORDER — OXALIPLATIN CHEMO INJECTION 100 MG/20ML
65.0000 mg/m2 | Freq: Once | INTRAVENOUS | Status: AC
  Administered 2019-05-14: 120 mg via INTRAVENOUS
  Filled 2019-05-14: qty 10

## 2019-05-14 MED ORDER — PALONOSETRON HCL INJECTION 0.25 MG/5ML
0.2500 mg | Freq: Once | INTRAVENOUS | Status: AC
  Administered 2019-05-14: 0.25 mg via INTRAVENOUS

## 2019-05-14 NOTE — Patient Instructions (Signed)
[facility] Discharge Instructions for Patients Receiving Chemotherapy  Today you received the following chemotherapy agents: Oxaliplatin/Leucovorin/5FU  To help prevent nausea and vomiting after your treatment, we encourage you to take your nausea medication as directed.   If you develop nausea and vomiting that is not controlled by your nausea medication, call the clinic.   BELOW ARE SYMPTOMS THAT SHOULD BE REPORTED IMMEDIATELY:  *FEVER GREATER THAN 100.5 F  *CHILLS WITH OR WITHOUT FEVER  NAUSEA AND VOMITING THAT IS NOT CONTROLLED WITH YOUR NAUSEA MEDICATION  *UNUSUAL SHORTNESS OF BREATH  *UNUSUAL BRUISING OR BLEEDING  TENDERNESS IN MOUTH AND THROAT WITH OR WITHOUT PRESENCE OF ULCERS  *URINARY PROBLEMS  *BOWEL PROBLEMS  UNUSUAL RASH Items with * indicate a potential emergency and should be followed up as soon as possible.  Feel free to call the clinic should you have any questions or concerns. The clinic phone number is [phone].  Please show the [facility] at check-in to the Emergency Department and triage nurse.  Oxaliplatin Injection Qu es este medicamento? El OXALIPLATINO es un agente quimioteraputico. Este medicamento acta sobre las clulas que se dividen rpidamente, como las clulas cancerosas, y finalmente provoca la muerte de estas clulas. Se utiliza en el tratamiento del cncer de colon y recto y 30 tipos de cncer. Este medicamento puede ser utilizado para otros usos; si tiene alguna pregunta consulte con su proveedor de atencin mdica o con su farmacutico. MARCAS COMUNES: Eloxatin Qu le debo informar a mi profesional de la salud antes de tomar este medicamento? Necesita saber si usted presenta alguno de los siguientes problemas o situaciones:  enfermedad renal  una reaccin alrgica o inusual al oxaliplatino, a otros agentes quimioteraputicos, a otros medicamentos, alimentos, colorantes o conservantes  si est  embarazada o buscando quedar embarazada  si est amamantando a un beb Cmo debo utilizar este medicamento? Este medicamento se administra mediante infusin por va intravenosa. Lo administra un profesional de la salud calificado en un hospital o en un entorno clnico. Hable con su pediatra para informarse acerca del uso de este medicamento en nios. Puede requerir atencin especial. Sobredosis: Pngase en contacto inmediatamente con un centro toxicolgico o una sala de urgencia si usted cree que haya tomado demasiado medicamento. ATENCIN: [employer] es solo para usted. No comparta este medicamento con nadie. Qu sucede si me olvido de una dosis? Es importante no olvidar ninguna dosis. Informe a su mdico o a su profesional de la salud si no puede asistir a Photographer. Qu puede interactuar con este medicamento?  medicamentos para incrementar los conteos sanguneos, tales como filgrastim, pegfilgrastim, sargramostim  probenecid  ciertos antibiticos, tales como amicacina, gentamicina, neomicina, polimixina B, estreptomicina, tobramicina  zalcitabina Consulte a su mdico o a su profesional de la salud antes de tomar cualquiera de los siguientes medicamentos:  acetaminofeno  aspirina  ibuprofeno  quetoprofeno  naproxeno Puede ser que esta lista no menciona todas las posibles interacciones. Informe a su profesional de KB Home	[place] de [employer] productos a base de hierbas, medicamentos de [place] o suplementos nutritivos que est tomando. Si usted fuma, consume bebidas alcohlicas o si utiliza drogas ilegales, indqueselo tambin a su profesional de KB Home	[place]. Algunas sustancias pueden interactuar con su medicamento. A qu debo estar atento al usar [employer]? Se supervisar su condicin atentamente mientras reciba este medicamento. Tendr que hacerse anlisis de sangre peridicos mientras reciba este medicamento. Este medicamento puede aumentar su sensibilidad al fro. No  tomar bebidas fras  o usar hielo. Cubra la piel expuesta antes de estar en contacto con temperaturas fras u objetos fros. Mientras se encuentra afuera cuando haga fro use ropa [place] su boca y su nariz para calentar el aire que entra en sus pulmones. Informe a su mdico si experimenta sensibilidad al fro. Este medicamento puede hacerle sentir un Nurse, mental health. Esto es normal ya que la quimioterapia afecta tanto a las clulas sanas como a las clulas cancerosas. Si presenta alguno de los [employer], infrmelos. Sin embargo, contine con el tratamiento aun si se siente enfermo, a menos que su mdico le indique que lo suspenda. En algunos casos, podr recibir [employer] para ayudarle con los efectos secundarios. Siga las instrucciones para usarlos. Consulte a su mdico o a su profesional de la salud por asesoramiento si tiene fiebre, escalofros, dolor de garganta o cualquier otro sntoma de resfro o gripe. No se trate usted mismo. Este medicamento puede reducir la capacidad del cuerpo para combatir infecciones. Trate de no acercarse a personas que estn enfermas. [employer] puede aumentar el riesgo de magulladuras o sangrado. Consulte a su mdico o a su profesional de la salud si observa sangrados inusuales. Proceda con cuidado al cepillar sus dientes, usar hilo dental o Risk manager palillos para los dientes, ya que puede contraer una infeccin o Therapist, art con mayor facilidad. Si se somete a algn tratamiento dental, informe a su dentista que est [employer]. Evite tomar productos que contienen aspirina, acetaminofeno, ibuprofeno, naproxeno o quetoprofeno a menos que as lo indique su mdico. Estos productos pueden disimular la fiebre. No se debe quedar embarazada mientras reciba este medicamento. Las mujeres deben informar a su mdico si estn buscando quedar embarazadas o si creen que estn embarazadas. Existe la posibilidad de efectos secundarios graves  a un beb sin nacer. Para ms informacin hable con su profesional de la salud o su farmacutico. No debe Economist a un beb mientras reciba este medicamento. Si tiene diarrea, llame a su mdico o a su profesional de KB Home	[place]. No se trate usted mismo. Qu efectos secundarios puedo tener al [employer] este medicamento? Efectos secundarios que debe informar a su mdico o a Barrister's clerk de la salud tan pronto como sea posible:  Chief of Staff como erupcin cutnea, picazn o urticarias, hinchazn de la cara, labios o lengua  conteos sanguneos bajos - este medicamento puede reducir la cantidad de glbulos blancos, glbulos rojos y plaquetas. Su riesgo de infeccin y [place].  signos de infeccin - fiebre o escalofros, tos, dolor de garganta, Social research officer, government o dificultad para orinar  signos de reduccin de plaquetas o sangrado - magulladuras, puntos rojos en la piel, heces de color oscuro o con aspecto alquitranado, sangrado por la nariz  signos de reduccin de glbulos rojos - cansancio o debilidad inusual, desmayos, sensacin de Lobbyist u opresin en el pecho  tos  diarrea  tensin en la mandbula  llagas en la boca  nuseas, vmito  dolor, hinchazn, enrojecimiento o irritacin en el lugar de la inyeccin  dolor, hormigueo, entumecimiento de manos o pies  problemas de coordinacin, del habla, al caminar  enrojecimiento, formacin de ampollas, descamacin o distensin de la piel, inclusive dentro de la boca  dificultad para orinar o cambios en el volumen de orina Efectos secundarios que, por lo general, no requieren atencin mdica (debe informarlos a su mdico o a su profesional de la salud si persisten o si son molestos):  cambios en la  visin  estreimiento  cada del cabello  prdida del apetito  sabor metlico en la boca o cambios en el sentido del gusto  dolor estomacal Puede ser que esta lista no menciona todos los  posibles efectos secundarios. Comunquese a su mdico por asesoramiento mdico [employer] Usted puede informar los efectos secundarios a la FDA por telfono al 1-800-FDA-1088. Dnde debo guardar mi medicina? Este medicamento se administra en hospitales o clnicas y no necesitar guardarlo en su domicilio. ATENCIN: Este folleto es un resumen. Puede ser que no cubra toda la posible informacin. Si usted tiene preguntas acerca de esta medicina, consulte con su mdico, su farmacutico o su profesional de Technical sales engineer.  2020 Elsevier/Gold Standard (2014-08-31 00:00:00)  Leucovorin injection Qu es este medicamento? La LEUCOVORINA se [place] para prevenir o tratar [employer] nocivos de ciertos medicamentos. Este medicamento tambin sirve para tratar la anemia provocada por una nivel bajo de cido flico en el cuerpo. Tambin se puede administrar con 5-fluorouracilo (5-FU), para tratar el cncer de colon. Este medicamento puede ser utilizado para otros usos; si tiene alguna pregunta consulte con su proveedor de atencin mdica o con su farmacutico. Qu le debo informar a mi profesional de la salud antes de tomar este medicamento? Necesita saber si usted presenta alguno de los siguientes problemas o situaciones:  anemia debido a bajos niveles de vitamina B-12 en la sangre  una reaccin alrgica o inusual a la leucovorina, cido flico, a otros medicamentos, alimentos, colorantes o conservantes  si est embarazada o buscando quedar embarazada  si est amamantando a un beb Cmo debo utilizar este medicamento? Este medicamento se administra mediante inyeccin por va intramuscular o intravenosa. Lo administra un profesional de Technical sales engineer en un hospital o en un entorno clnico. Hable con su pediatra para informarse acerca del uso de este medicamento en nios. Puede requerir atencin especial. Sobredosis: Pngase en contacto inmediatamente con un centro toxicolgico o una sala de  urgencia si usted cree que haya tomado demasiado medicamento. ATENCIN: [employer] es solo para usted. No comparta este medicamento con nadie. Qu sucede si me olvido de una dosis? No se aplica en este caso. Qu puede interactuar con este medicamento?  capecitabina  fluorouracilo  fenobarbital  fenitona  primidona  trimetoprima- sulfametoxasol Puede ser que esta lista no menciona todas las posibles interacciones. Informe a su profesional de KB Home	[place] de [employer] productos a base de hierbas, medicamentos de [place] o suplementos nutritivos que est tomando. Si usted fuma, consume bebidas alcohlicas o si utiliza drogas ilegales, indqueselo tambin a su profesional de KB Home	[place]. Algunas sustancias pueden interactuar con su medicamento. A qu debo estar atento al usar [employer]? Se supervisar su estado de salud atentamente mientras reciba este medicamento. Este medicamento puede aumentar los efectos secundarios de 5-fluorouracilo, 5-FU. Si tiene diarrea o [employer] boca que no mejoran o que [place], consulte a su mdico o a su profesional de KB Home	[place]. Qu efectos secundarios puedo tener al [employer] este medicamento? Efectos secundarios que debe informar a su mdico o a Barrister's clerk de la salud tan pronto como sea posible:  Chief of Staff como erupcin cutnea, picazn o urticarias, hinchazn de la cara, labios o lengua  problemas respiratorios  fiebre, infeccin  llagas en la boca  sangrado, magulladuras inusuales  cansancio o debilidad inusual Efectos secundarios que, por lo general, no requieren atencin mdica (debe informarlos a su mdico o a su profesional de la salud si persisten o si son  molestos):  estreimiento o diarrea  prdida del apetito  nuseas, vmito Puede ser que esta lista no menciona todos los posibles efectos secundarios. Comunquese a su mdico por asesoramiento mdico [employer]. Usted puede informar  los efectos secundarios a la FDA por telfono al 1-800-FDA-1088. Dnde debo guardar mi medicina? Este medicamento se administra en hospitales o clnicas y no necesitar guardarlo en su domicilio. ATENCIN: Este folleto es un resumen. Puede ser que no cubra toda la posible informacin. Si usted tiene preguntas acerca de esta medicina, consulte con su mdico, su farmacutico o su profesional de Technical sales engineer.  2020 Elsevier/Gold Standard (2014-08-31 00:00:00)  Fluorouracil, 5-FU injection Qu es este medicamento? El [place], 5-FU es un agente quimioteraputico. Este medicamento reduce el crecimiento de las clulas cancerosas. Se utiliza en el tratamiento de muchos tipos de cncer, incluyendo el cncer de mama, de colon y recto, pancretico y de Paramedic. Este medicamento puede ser utilizado para otros usos; si tiene alguna pregunta consulte con su proveedor de atencin mdica o con su farmacutico. MARCAS COMUNES: [place] Qu le debo informar a mi profesional de la salud antes de tomar este medicamento? Necesita saber si usted presenta alguno de los siguientes problemas o situaciones:  trastornos sanguneos  deficiencia de la dihidropirimidina deshidrogenasa (DPD)  infeccin (especialmente infecciones virales, como varicela o herpes)  enfermedad renal  enfermedad heptica  desnutrido, malnutricin  radioterapia reciente o continuada  una reaccin alrgica o inusual al fluorouracilo, a otros agentes quimioteraputicos, otros medicamentos, alimentos, colorantes o conservantes  si est embarazada o buscando quedar embarazada  si est amamantando a un beb Cmo debo utilizar este medicamento? Este medicamento se administra mediante inyeccin o infusin por va intravenosa. Lo administra un profesional de la salud calificado en un hospital o en un entorno clnico. Hable con su pediatra para informarse acerca del uso de este medicamento en nios. Puede requerir atencin  especial. Sobredosis: Pngase en contacto inmediatamente con un centro toxicolgico o una sala de urgencia si usted cree que haya tomado demasiado medicamento. ATENCIN: [employer] es solo para usted. No comparta este medicamento con nadie. Qu sucede si me olvido de una dosis? Es importante no olvidar ninguna dosis. Informe a su mdico o a su profesional de la salud si no puede asistir a Photographer. Qu puede interactuar con este medicamento?  alopurinol  cimetidina  dapsona  digoxina  hidroxiurea  leucovorina  levamisol  medicamentos para convulsiones, tales como etotona, fosfenitona, fenitona  medicamentos para incrementar los conteos sanguneos, tales como filgrastim, pegfilgrastim, sargramostim  medicamentos que tratan o previenen cogulos sanguneos, tales como warfarina, enoxaparina y dalteparina  metotrexato  metronidazol  pirimetamina  otros agentes quimioteraputicos, tales como busulfn, cisplatino, estramustina, vinblastina  trimetoprima  trimetrexato  vacunas Consulte a su mdico o a su profesional de la salud antes de tomar cualquiera de los siguientes medicamentos:  acetaminofeno  aspirina  ibuprofeno  quetoprofeno  naproxeno Puede ser que esta lista no menciona todas las posibles interacciones. Informe a su profesional de KB Home	[place] de [employer] productos a base de hierbas, medicamentos de [place] o suplementos nutritivos que est tomando. Si usted fuma, consume bebidas alcohlicas o si utiliza drogas ilegales, indqueselo tambin a su profesional de KB Home	[place]. Algunas sustancias pueden interactuar con su medicamento. A qu debo estar atento al usar Coca[employer]? Visite a su mdico para chequear su evolucin peridicamente. Este medicamento puede hacerle sentir un Nurse, mental health. Esto es normal ya que la quimioterapia afecta tanto a las clulas  sanas como a las clulas cancerosas. Si presenta alguno de los [employer],  infrmelos. Sin embargo, contine con el tratamiento aun si se siente enfermo, a menos que su mdico le indique que lo suspenda. En algunos casos, podr recibir [employer] para ayudarle con los efectos secundarios. Siga las instrucciones para usarlos. Consulte a su mdico o a su profesional de la salud por asesoramiento si tiene fiebre, escalofros, dolor de garganta o cualquier otro sntoma de resfro o gripe. No se trate usted mismo. Este medicamento puede reducir la capacidad del cuerpo para combatir infecciones. Trate de no acercarse a personas que estn enfermas. [employer] puede aumentar el riesgo de magulladuras o sangrado. Consulte a su mdico o a su profesional de la salud si observa sangrados inusuales. Proceda con cuidado al cepillar sus dientes, usar hilo dental o Risk manager palillos para los dientes, ya que puede contraer una infeccin o Therapist, art con mayor facilidad. Si se somete a algn tratamiento dental, informe a su dentista que est [employer]. Evite tomar productos que contienen aspirina, acetaminofeno, ibuprofeno, naproxeno o quetoprofeno a menos que as lo indique su mdico. Estos productos pueden disimular la fiebre. No se debe quedar embarazada mientras recibe este medicamento. Las mujeres deben informar a su mdico si estn buscando quedar embarazadas o si creen que estn embarazadas. Existe la posibilidad de efectos secundarios graves a un beb sin nacer. Para ms informacin hable con su profesional de la salud o su farmacutico. No debe Economist a un beb mientras est usando este medicamento. Los hombres deben informar a su mdico si quieren tener nios. Este medicamento puede reducir el conteo de esperma. No trate la diarrea con productos de [employer]. Comunquese con su mdico si tiene diarrea que dura ms de 2 das o si es severa y [place]. Este medicamento puede aumentar la sensibilidad al sol. Mantngase fuera de Administrator. Si no lo puede  evitar, utilice ropa protectora y crema de Photographer. No utilice lmparas solares, camas solares ni cabinas solares. Qu efectos secundarios puedo tener al [employer] este medicamento? Efectos secundarios que debe informar a su mdico o a Barrister's clerk de la salud tan pronto como sea posible:  Chief of Staff como erupcin cutnea, picazn o urticarias, hinchazn de la cara, labios o lengua  conteos sanguneos bajos - este medicamento puede reducir la cantidad de glbulos blancos, glbulos rojos y plaquetas. Su riesgo de infeccin y [place].  signos de infeccin - fiebre o escalofros, tos, dolor de garganta, Social research officer, government o dificultad para orinar  signos de reduccin de plaquetas o sangrado - magulladuras, puntos rojos en la piel, heces de color oscuro o con aspecto alquitranado, sangre en la orina  signos de reduccin de glbulos rojos - cansancio o debilidad inusual, desmayos, sensacin de mareo  problemas respiratorios  cambios en la visin  dolor en el pecho  llagas en la boca  nuseas, vmito  dolor, hinchazn, enrojecimiento en el lugar de la inyeccin  hormigueo, dolor, entumecimiento de manos o pies  enrojecimiento, hinchazn o llagas en las manos o pies  dolor estomacal  sangrado inusuales Efectos secundarios que, por lo general, no requieren atencin mdica (debe informarlos a su mdico o a su profesional de la salud si persisten o si son molestos):  cambios en las uas de las manos o pies  diarrea  picazn o sequedad de la piel  cada del cabello  dolor de cabeza  prdida del apetito  sensibilidad de los ojos a la  luz  Higher education careers adviser  ojos inusualmente llorosos Puede ser que esta lista no menciona todos los posibles efectos secundarios. Comunquese a su mdico por asesoramiento mdico [employer]. Usted puede informar los efectos secundarios a la FDA por telfono al 1-800-FDA-1088. Dnde debo guardar mi  medicina? Este medicamento se administra en hospitales o clnicas y no necesitar guardarlo en su domicilio. ATENCIN: Este folleto es un resumen. Puede ser que no cubra toda la posible informacin. Si usted tiene preguntas acerca de esta medicina, consulte con su mdico, su farmacutico o su profesional de Technical sales engineer.  2020 Elsevier/Gold Standard (2014-08-31 00:00:00)

## 2019-05-14 NOTE — Progress Notes (Signed)
  [facility] OFFICE PROGRESS NOTE   Diagnosis: Colon cancer  INTERVAL HISTORY:   [facility] M[facility] returns as scheduled.  He completed cycle 2 FOLFOX 04/30/2019.  He received Udenyca on the day of pump discontinuation.  He had mild nausea following the chemotherapy.  No mouth sores.  No diarrhea.  Cold sensitivity lasted 3 to 4 days.  No numbness or tingling today.  No bone pain following Udenyca. Objective:  Vital signs in last 24 hours:  Blood pressure 126/86, pulse 63, temperature 98.3 F (36.8 C), temperature source Temporal, resp. rate 17, height _0  (1.702 m), weight 166 lb 9.6 oz (75.6 kg), SpO2 100 %.    HEENT: No thrush or ulcers. GI: Abdomen soft and nontender.  No hepatomegaly. Vascular: No leg edema. Skin: Palms without erythema. Port-A-Cath without erythema.   Lab Results:  Lab Results  Component Value Date   WBC 10.5 05/14/2019   HGB 13.2 05/14/2019   HCT 40.3 05/14/2019   MCV 78.3 (L) 05/14/2019   PLT 92 (L) 05/14/2019   NEUTROABS 7.0 05/14/2019    Imaging:  No results found.  Medications: I have reviewed the patient's current medications.  Assessment/Plan: 1. 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 Microsatellite stable, no loss of mismatch repair protein expression ? CTs 12/24/2018-sigmoid: Wall thickening, multiple enlarged nodes adjacent to the sigmoid colon, no evidence of metastatic disease ? Cycle 1 FOLFOX 04/14/2019 ? Cycle 2 FOLFOX 04/30/2019, Udenyca added ? Cycle 3 FOLFOX 05/14/2019 (oxaliplatin dose reduced to 65 mg/m due to thrombocytopenia) 2. COVID-19 respiratory infection June 2020 3. Diabetes diagnosed June 2020 4. Port-A-Cath placement, Dr. Johney Maine, 04/09/2019  Disposition: [name] appears stable.  He has completed 2 cycles of FOLFOX.  Plan to proceed with cycle 3 today as scheduled.  He will again receive Udenyca on the day of pump discontinuation.  He has mild  thrombocytopenia.  Oxaliplatin will be dose reduced to 65 mg/m.  He understands to contact the office with any bleeding.  He will return for lab, follow-up, cycle 4 FOLFOX in 2 weeks.  He will contact the office in the interim as outlined above or with any other problems.  CBC/Oxaliplatin dose reduction discussed with Dr. Benay Spice.   Ned Card ANP/GNP-BC   05/14/2019  1:03 PM

## 2019-05-15 ENCOUNTER — Telehealth: Payer: Self-pay | Admitting: Nurse Practitioner

## 2019-05-15 NOTE — Telephone Encounter (Signed)
Called and spoke with patient. Confirmed appts  °

## 2019-05-16 ENCOUNTER — Other Ambulatory Visit: Payer: Self-pay

## 2019-05-16 ENCOUNTER — Inpatient Hospital Stay: Payer: BC Managed Care – PPO

## 2019-05-16 VITALS — BP 127/73 | HR 70 | Temp 99.4°F | Resp 18

## 2019-05-16 DIAGNOSIS — C187 Malignant neoplasm of sigmoid colon: Secondary | ICD-10-CM | POA: Diagnosis not present

## 2019-05-16 MED ORDER — PEGFILGRASTIM-CBQV 6 MG/0.6ML ~~LOC~~ SOSY
PREFILLED_SYRINGE | SUBCUTANEOUS | Status: AC
  Filled 2019-05-16: qty 0.6

## 2019-05-16 MED ORDER — PEGFILGRASTIM-CBQV 6 MG/0.6ML ~~LOC~~ SOSY
6.0000 mg | PREFILLED_SYRINGE | Freq: Once | SUBCUTANEOUS | Status: AC
  Administered 2019-05-16: 6 mg via SUBCUTANEOUS

## 2019-05-16 MED ORDER — SODIUM CHLORIDE 0.9% FLUSH
10.0000 mL | INTRAVENOUS | Status: DC | PRN
  Administered 2019-05-16: 10 mL
  Filled 2019-05-16: qty 10

## 2019-05-16 MED ORDER — HEPARIN SOD (PORK) LOCK FLUSH 100 UNIT/ML IV SOLN
500.0000 [IU] | Freq: Once | INTRAVENOUS | Status: AC | PRN
  Administered 2019-05-16: 500 [IU]
  Filled 2019-05-16: qty 5

## 2019-05-16 NOTE — Patient Instructions (Signed)

## 2019-05-24 ENCOUNTER — Other Ambulatory Visit: Payer: Self-pay | Admitting: Oncology

## 2019-05-26 NOTE — Progress Notes (Signed)
[place]   Telephone:(336) [phone] Fax:(336) [phone]   Clinic Follow up Note   Patient Care Team: Enid Skeens., MD as PCP - General (Family Medicine) Ladell Pier, MD as Consulting Physician (Oncology) Jackquline Denmark, MD as Consulting Physician (Gastroenterology) Michael Boston, MD as Consulting Physician (General Surgery) 05/27/2019  CHIEF COMPLAINT: F/u colon cancer   CURRENT THERAPY: FOLFOX   INTERVAL HISTORY: Oscar Mccarty returns for f/u and treatment as scheduled. He completed cycle 3 on 05/14/19. Oxaliplatin was dose-reduced to thrombocytopenia. He had one episode of bleeding during BM where he had to strain, no epistaxis or gum bleeding. Manages constipation with miralax PRN. Has mild nausea without vomiting on day 4, resolves with compazine. Denies mucositis. Appetite is good. Energy level improves after pump d/c. He is active and walks for exercise. Cold sensitivity lasted for 3 days after pump d/c, no other neuropathy. His PCP added lisinopril recently for "protein in my urine." He checks BG daily, ranges 110-145 (highest right after chemo). Denies new or worsening pain, bone pain, fever, chills, cough, chest pain, dyspnea, or leg swelling.    MEDICAL HISTORY:  Past Medical History:  Diagnosis Date  . Acute respiratory disease due to COVID-19 virus - June 2020 01/05/2019  . 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 Allergic rhinitis   . Chronic headaches   . Depression   . Diabetes mellitus, type 2 ([place]) 12/25/2018   new diagnosis, A1C 9.2% 12/25/2018  . Displaced fracture of distal phalanx of left middle finger 04/03/2016  . GERD (gastroesophageal reflux disease)   . History of COVID-19 respiratory pneumonitis June 2020 03/11/2019  . 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 COLONOSCOPY    . no surgical history    . PORTACATH PLACEMENT N/A 04/09/2019   Procedure: PLACEMENT OF PORT-A-CATHETER UNDER ULTRASOUND AND FLUOROSCOPIC GUIDANCE;  Surgeon: Michael Boston, MD;  Location: [facility];  Service: General;  Laterality: N/A;  . PROCTOSCOPY N/A 03/11/2019   Procedure: RIGID PROCTOSCOPY;  Surgeon: Michael Boston, MD;  Location: [facility];  Service: General;  Laterality: N/A;  . XI ROBOTIC ASSISTED COLOSTOMY TAKEDOWN N/A 03/11/2019   Procedure: XI ROBOTIC ASSISTED LEFT COLECTOMY, MOBILIZATION OF SPLENIC FLEXURE, BILATERAL TAP BLOCK;  Surgeon: Michael Boston, MD;  Location: [facility];  Service: General;  Laterality: N/A;    I have reviewed the social history and family history with the patient and they are unchanged from previous note.  ALLERGIES:  has No Known Allergies.  MEDICATIONS:  Current Outpatient Medications  Medication Sig Dispense Refill  . ibuprofen (ADVIL) 200 MG tablet Take 400 mg by mouth every 6 (six) hours as needed for headache or moderate pain.    Marland Kitchen lidocaine-prilocaine (EMLA) cream Apply 1 application topically as needed. 30 g 0  . lisinopril (ZESTRIL) 2.5 MG tablet Take 2.5 mg by mouth daily.    . metFORMIN (GLUCOPHAGE) 500 MG tablet Take 1 tablet (500 mg total) by mouth 2 (two) times daily with a meal. 60 tablet 11  . prochlorperazine (COMPAZINE) 10 MG tablet Take 1 tablet (10 mg total) by mouth every 6 (six) hours as needed for nausea or vomiting. 30 tablet 0  . traMADol (ULTRAM) 50 MG tablet Take 1-2 tablets (50-100 mg total) by mouth every 6 (six) hours as needed for moderate pain or  severe pain. 20 tablet 0   No current facility-administered medications for this visit.    Facility-Administered Medications Ordered in Other Visits  Medication Dose Route Frequency Provider Last Rate Last Dose  . fluorouracil (ADRUCIL) 4,500 mg in sodium chloride 0.9 % 60 mL chemo infusion  2,400 mg/m2 (Treatment Plan Recorded) Intravenous 1 day or 1 dose  Ladell Pier, MD      . fluorouracil (ADRUCIL) chemo injection 750 mg  400 mg/m2 (Treatment Plan Recorded) Intravenous Once Ladell Pier, MD      . leucovorin 748 mg in dextrose 5 % 250 mL infusion  400 mg/m2 (Treatment Plan Recorded) Intravenous Once Ladell Pier, MD      . oxaliplatin (ELOXATIN) 120 mg in dextrose 5 % 500 mL chemo infusion  65 mg/m2 (Treatment Plan Recorded) Intravenous Once Ladell Pier, MD        PHYSICAL EXAMINATION:  Vitals:   05/27/19 1121  BP: 115/80  Pulse: 76  Resp: 18  Temp: 97.9 F (36.6 C)  SpO2: 100%   Filed Weights   05/27/19 1121  Weight: 167 lb 3.2 oz (75.8 kg)    GENERAL:alert, no distress and comfortable SKIN: no rash  EYES:  sclera clear LUNGS: clear with normal breathing effort HEART: regular rate & rhythm, no lower extremity edema ABDOMEN:abdomen soft, non-tender and normal bowel sounds. No hepatomegaly. Surgical incisions healed NEURO: alert & oriented x 3 with fluent speech, normal gait PAC without obvious erythema, incision is covered by dressing   LABORATORY DATA:  I have reviewed the data as listed CBC Latest Ref Rng & Units 05/27/2019 05/14/2019 04/30/2019  WBC 4.0 - 10.5 K/uL 9.8 10.5 3.6(L)  Hemoglobin 13.0 - 17.0 g/dL 13.5 13.2 12.7(L)  Hematocrit 39.0 - 52.0 % 41.7 40.3 39.3  Platelets 150 - 400 K/uL 129(L) 92(L) 162     CMP Latest Ref Rng & Units 05/27/2019 05/14/2019 04/27/2019  Glucose 70 - 99 mg/dL 147(H) 129(H) 118(H)  BUN 6 - 20 mg/dL _0 Creatinine 0.61 - 1.24 mg/dL 0.83 0.80 0.82  Sodium 135 - 145 mmol/L 138 139 142  Potassium 3.5 - 5.1 mmol/L 4.2 4.1 4.4  Chloride 98 - 111 mmol/L 105 105 108  CO2 22 - 32 mmol/L _1 Calcium 8.9 - 10.3 mg/dL 8.9 8.8(L) 8.8(L)  Total Protein 6.5 - 8.1 g/dL 7.3 7.1 7.1  Total Bilirubin 0.3 - 1.2 mg/dL 0.3 0.2(L) 0.4  Alkaline Phos 38 - 126 U/L 151(H) 131(H) 67  AST 15 - 41 U/L _2 ALT 0 - 44 U/L 41 27 21      RADIOGRAPHIC STUDIES: I have  personally reviewed the radiological images as listed and agreed with the findings in the report. No results found.   ASSESSMENT & PLAN:   1. 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 Microsatellite stable, no loss of mismatch repair protein expression ? CTs 12/24/2018-sigmoid: Wall thickening, multiple enlarged nodes adjacent to the sigmoid colon, no evidence of metastatic disease ? Cycle 1 FOLFOX 04/14/2019 ? Cycle 2 FOLFOX 04/30/2019, Udenyca added ? Cycle 3 FOLFOX 05/14/2019 (oxaliplatin dose reduced to 65 mg/m due to thrombocytopenia) ? Cycle 4 FOLFOX 05/27/2019 (continue oxaliplatin dose reduction to 63m/m2, PLT 129K) 2. COVID-19 respiratory infection June 2020 3. Diabetes diagnosed June 2020 4. Port-A-Cath placement, Dr. GJohney Maine 04/09/2019  Disposition:  Mr. DHjortappears well. He completed 3 cycles adjuvant FOLFOX. Oxaliplatin was dose-reduced with cycle 3  due to thrombocytopenia. He tolerates treatment well with mild nausea, constipation, and cold sensitivity. He is able to recover well. Labs reviewed, CBC and CMP are adequate for treatment. PLT improved to 129K, continue oxaliplatin dose-reduction with cycle 4 today. He will return for pump d/c and Udenyca on day 3, then office visit with Dr. Benay Spice in 2 weeks with cycle 5. I updated Dr. Benay Spice on the plan.   All questions were answered. The patient knows to call the clinic with any problems, questions or concerns. No barriers to learning was detected.     Oscar Feeling, NP 05/27/19

## 2019-05-27 ENCOUNTER — Inpatient Hospital Stay: Payer: BC Managed Care – PPO

## 2019-05-27 ENCOUNTER — Encounter: Payer: Self-pay | Admitting: Nurse Practitioner

## 2019-05-27 ENCOUNTER — Inpatient Hospital Stay: Payer: BC Managed Care – PPO | Attending: Oncology | Admitting: Nurse Practitioner

## 2019-05-27 ENCOUNTER — Other Ambulatory Visit: Payer: Self-pay

## 2019-05-27 VITALS — BP 115/80 | HR 76 | Temp 97.9°F | Resp 18 | Ht 67.0 in | Wt 167.2 lb

## 2019-05-27 DIAGNOSIS — Z5111 Encounter for antineoplastic chemotherapy: Secondary | ICD-10-CM | POA: Insufficient documentation

## 2019-05-27 DIAGNOSIS — Z23 Encounter for immunization: Secondary | ICD-10-CM

## 2019-05-27 DIAGNOSIS — Z5189 Encounter for other specified aftercare: Secondary | ICD-10-CM | POA: Insufficient documentation

## 2019-05-27 DIAGNOSIS — Z95828 Presence of other vascular implants and grafts: Secondary | ICD-10-CM

## 2019-05-27 DIAGNOSIS — C187 Malignant neoplasm of sigmoid colon: Secondary | ICD-10-CM

## 2019-05-27 LAB — CMP (CANCER CENTER ONLY)
ALT: 41 U/L (ref 0–44)
AST: 22 U/L (ref 15–41)
Albumin: 3.9 g/dL (ref 3.5–5.0)
Alkaline Phosphatase: 151 U/L — ABNORMAL HIGH (ref 38–126)
Anion gap: 10 (ref 5–15)
BUN: 11 mg/dL (ref 6–20)
CO2: 23 mmol/L (ref 22–32)
Calcium: 8.9 mg/dL (ref 8.9–10.3)
Chloride: 105 mmol/L (ref 98–111)
Creatinine: 0.83 mg/dL (ref 0.61–1.24)
GFR, Est AFR Am: >60 mL/min (ref >60)
GFR, Estimated: >60 mL/min (ref >60)
Glucose, Bld: 147 mg/dL — ABNORMAL HIGH (ref 70–99)
Potassium: 4.2 mmol/L (ref 3.5–5.1)
Sodium: 138 mmol/L (ref 135–145)
Total Bilirubin: 0.3 mg/dL (ref 0.3–1.2)
Total Protein: 7.3 g/dL (ref 6.5–8.1)

## 2019-05-27 LAB — CBC WITH DIFFERENTIAL (CANCER CENTER ONLY)
Abs Immature Granulocytes: 0.08 10*3/uL — ABNORMAL HIGH (ref 0.00–0.07)
Basophils Absolute: 0.1 10*3/uL (ref 0.0–0.1)
Basophils Relative: 1 %
Eosinophils Absolute: 0.1 10*3/uL (ref 0.0–0.5)
Eosinophils Relative: 1 %
HCT: 41.7 % (ref 39.0–52.0)
Hemoglobin: 13.5 g/dL (ref 13.0–17.0)
Immature Granulocytes: 1 %
Lymphocytes Relative: 19 %
Lymphs Abs: 1.9 10*3/uL (ref 0.7–4.0)
MCH: 25.6 pg — ABNORMAL LOW (ref 26.0–34.0)
MCHC: 32.4 g/dL (ref 30.0–36.0)
MCV: 79 fL — ABNORMAL LOW (ref 80.0–100.0)
Monocytes Absolute: 0.9 10*3/uL (ref 0.1–1.0)
Monocytes Relative: 9 %
Neutro Abs: 6.8 10*3/uL (ref 1.7–7.7)
Neutrophils Relative %: 69 %
Platelet Count: 129 10*3/uL — ABNORMAL LOW (ref 150–400)
RBC: 5.28 MIL/uL (ref 4.22–5.81)
RDW: 15.5 % (ref 11.5–15.5)
WBC Count: 9.8 10*3/uL (ref 4.0–10.5)
nRBC: 0 % (ref 0.0–0.2)

## 2019-05-27 MED ORDER — PALONOSETRON HCL INJECTION 0.25 MG/5ML
INTRAVENOUS | Status: AC
  Filled 2019-05-27: qty 5

## 2019-05-27 MED ORDER — SODIUM CHLORIDE 0.9% FLUSH
10.0000 mL | INTRAVENOUS | Status: DC | PRN
  Administered 2019-05-27: 11:00:00 10 mL
  Filled 2019-05-27: qty 10

## 2019-05-27 MED ORDER — FLUOROURACIL CHEMO INJECTION 2.5 GM/50ML
400.0000 mg/m2 | Freq: Once | INTRAVENOUS | Status: AC
  Administered 2019-05-27: 16:00:00 750 mg via INTRAVENOUS
  Filled 2019-05-27: qty 15

## 2019-05-27 MED ORDER — PALONOSETRON HCL INJECTION 0.25 MG/5ML
0.2500 mg | Freq: Once | INTRAVENOUS | Status: AC
  Administered 2019-05-27: 0.25 mg via INTRAVENOUS

## 2019-05-27 MED ORDER — DEXAMETHASONE SODIUM PHOSPHATE 10 MG/ML IJ SOLN
INTRAMUSCULAR | Status: AC
  Filled 2019-05-27: qty 1

## 2019-05-27 MED ORDER — SODIUM CHLORIDE 0.9 % IV SOLN
2400.0000 mg/m2 | INTRAVENOUS | Status: DC
  Administered 2019-05-27: 4500 mg via INTRAVENOUS
  Filled 2019-05-27: qty 90

## 2019-05-27 MED ORDER — DEXAMETHASONE SODIUM PHOSPHATE 10 MG/ML IJ SOLN
10.0000 mg | Freq: Once | INTRAMUSCULAR | Status: AC
  Administered 2019-05-27: 10 mg via INTRAVENOUS

## 2019-05-27 MED ORDER — OXALIPLATIN CHEMO INJECTION 100 MG/20ML
65.0000 mg/m2 | Freq: Once | INTRAVENOUS | Status: AC
  Administered 2019-05-27: 120 mg via INTRAVENOUS
  Filled 2019-05-27: qty 10

## 2019-05-27 MED ORDER — INFLUENZA VAC SPLIT QUAD 0.5 ML IM SUSY
0.5000 mL | PREFILLED_SYRINGE | Freq: Once | INTRAMUSCULAR | Status: AC
  Administered 2019-05-27: 0.5 mL via INTRAMUSCULAR

## 2019-05-27 MED ORDER — DEXTROSE 5 % IV SOLN
Freq: Once | INTRAVENOUS | Status: AC
  Administered 2019-05-27: 12:00:00 via INTRAVENOUS
  Filled 2019-05-27: qty 250

## 2019-05-27 MED ORDER — LEUCOVORIN CALCIUM INJECTION 350 MG
400.0000 mg/m2 | Freq: Once | INTRAVENOUS | Status: AC
  Administered 2019-05-27: 748 mg via INTRAVENOUS
  Filled 2019-05-27: qty 37.4

## 2019-05-27 MED ORDER — INFLUENZA VAC SPLIT QUAD 0.5 ML IM SUSY
PREFILLED_SYRINGE | INTRAMUSCULAR | Status: AC
  Filled 2019-05-27: qty 0.5

## 2019-05-27 NOTE — Patient Instructions (Signed)
[facility] Discharge Instructions for Patients Receiving Chemotherapy  Today you received the following chemotherapy agents: Oxaliplatin, Leucovorin, and 5FU.  To help prevent nausea and vomiting after your treatment, we encourage you to take your nausea medication as directed.   If you develop nausea and vomiting that is not controlled by your nausea medication, call the clinic.   BELOW ARE SYMPTOMS THAT SHOULD BE REPORTED IMMEDIATELY:  *FEVER GREATER THAN 100.5 F  *CHILLS WITH OR WITHOUT FEVER  NAUSEA AND VOMITING THAT IS NOT CONTROLLED WITH YOUR NAUSEA MEDICATION  *UNUSUAL SHORTNESS OF BREATH  *UNUSUAL BRUISING OR BLEEDING  TENDERNESS IN MOUTH AND THROAT WITH OR WITHOUT PRESENCE OF ULCERS  *URINARY PROBLEMS  *BOWEL PROBLEMS  UNUSUAL RASH Items with * indicate a potential emergency and should be followed up as soon as possible.  Feel free to call the clinic should you have any questions or concerns. The clinic phone number is [phone].  Please show the CHEMO ALERT CARD at check-in to the Emergency Department and triage nurse.    

## 2019-05-29 ENCOUNTER — Other Ambulatory Visit: Payer: Self-pay

## 2019-05-29 ENCOUNTER — Inpatient Hospital Stay: Payer: BC Managed Care – PPO

## 2019-05-29 VITALS — BP 118/71 | HR 79 | Temp 98.7°F | Resp 18

## 2019-05-29 DIAGNOSIS — C187 Malignant neoplasm of sigmoid colon: Secondary | ICD-10-CM | POA: Diagnosis not present

## 2019-05-29 MED ORDER — PEGFILGRASTIM-CBQV 6 MG/0.6ML ~~LOC~~ SOSY
PREFILLED_SYRINGE | SUBCUTANEOUS | Status: AC
  Filled 2019-05-29: qty 0.6

## 2019-05-29 MED ORDER — PEGFILGRASTIM-CBQV 6 MG/0.6ML ~~LOC~~ SOSY
6.0000 mg | PREFILLED_SYRINGE | Freq: Once | SUBCUTANEOUS | Status: AC
  Administered 2019-05-29: 6 mg via SUBCUTANEOUS

## 2019-05-29 MED ORDER — SODIUM CHLORIDE 0.9% FLUSH
10.0000 mL | INTRAVENOUS | Status: DC | PRN
  Administered 2019-05-29: 14:00:00 10 mL
  Filled 2019-05-29: qty 10

## 2019-05-29 MED ORDER — HEPARIN SOD (PORK) LOCK FLUSH 100 UNIT/ML IV SOLN
500.0000 [IU] | Freq: Once | INTRAVENOUS | Status: AC | PRN
  Administered 2019-05-29: 14:00:00 500 [IU]
  Filled 2019-05-29: qty 5

## 2019-05-29 NOTE — Patient Instructions (Signed)

## 2019-06-07 ENCOUNTER — Other Ambulatory Visit: Payer: Self-pay | Admitting: Oncology

## 2019-06-09 ENCOUNTER — Other Ambulatory Visit: Payer: Self-pay | Admitting: *Deleted

## 2019-06-09 DIAGNOSIS — C187 Malignant neoplasm of sigmoid colon: Secondary | ICD-10-CM

## 2019-06-10 ENCOUNTER — Inpatient Hospital Stay (HOSPITAL_BASED_OUTPATIENT_CLINIC_OR_DEPARTMENT_OTHER): Payer: BC Managed Care – PPO | Admitting: Oncology

## 2019-06-10 ENCOUNTER — Inpatient Hospital Stay: Payer: BC Managed Care – PPO

## 2019-06-10 ENCOUNTER — Encounter: Payer: Self-pay | Admitting: *Deleted

## 2019-06-10 ENCOUNTER — Other Ambulatory Visit: Payer: Self-pay

## 2019-06-10 VITALS — BP 127/80 | HR 79 | Temp 98.0°F | Resp 18 | Ht 67.0 in | Wt 165.8 lb

## 2019-06-10 DIAGNOSIS — Z95828 Presence of other vascular implants and grafts: Secondary | ICD-10-CM

## 2019-06-10 DIAGNOSIS — C187 Malignant neoplasm of sigmoid colon: Secondary | ICD-10-CM

## 2019-06-10 LAB — CBC WITH DIFFERENTIAL (CANCER CENTER ONLY)
Abs Immature Granulocytes: 0.42 10*3/uL — ABNORMAL HIGH (ref 0.00–0.07)
Basophils Absolute: 0.1 10*3/uL (ref 0.0–0.1)
Basophils Relative: 1 %
Eosinophils Absolute: 0.1 10*3/uL (ref 0.0–0.5)
Eosinophils Relative: 1 %
HCT: 42.3 % (ref 39.0–52.0)
Hemoglobin: 13.5 g/dL (ref 13.0–17.0)
Immature Granulocytes: 4 %
Lymphocytes Relative: 21 %
Lymphs Abs: 2.3 10*3/uL (ref 0.7–4.0)
MCH: 25.5 pg — ABNORMAL LOW (ref 26.0–34.0)
MCHC: 31.9 g/dL (ref 30.0–36.0)
MCV: 80 fL (ref 80.0–100.0)
Monocytes Absolute: 1 10*3/uL (ref 0.1–1.0)
Monocytes Relative: 9 %
Neutro Abs: 6.9 10*3/uL (ref 1.7–7.7)
Neutrophils Relative %: 64 %
Platelet Count: 124 10*3/uL — ABNORMAL LOW (ref 150–400)
RBC: 5.29 MIL/uL (ref 4.22–5.81)
RDW: 17.2 % — ABNORMAL HIGH (ref 11.5–15.5)
WBC Count: 10.9 10*3/uL — ABNORMAL HIGH (ref 4.0–10.5)
nRBC: 0 % (ref 0.0–0.2)

## 2019-06-10 LAB — CMP (CANCER CENTER ONLY)
ALT: 37 U/L (ref 0–44)
AST: 24 U/L (ref 15–41)
Albumin: 3.9 g/dL (ref 3.5–5.0)
Alkaline Phosphatase: 176 U/L — ABNORMAL HIGH (ref 38–126)
Anion gap: 11 (ref 5–15)
BUN: 10 mg/dL (ref 6–20)
CO2: 23 mmol/L (ref 22–32)
Calcium: 8.6 mg/dL — ABNORMAL LOW (ref 8.9–10.3)
Chloride: 104 mmol/L (ref 98–111)
Creatinine: 0.89 mg/dL (ref 0.61–1.24)
GFR, Est AFR Am: >60 mL/min (ref >60)
GFR, Estimated: >60 mL/min (ref >60)
Glucose, Bld: 225 mg/dL — ABNORMAL HIGH (ref 70–99)
Potassium: 4.2 mmol/L (ref 3.5–5.1)
Sodium: 138 mmol/L (ref 135–145)
Total Bilirubin: 0.3 mg/dL (ref 0.3–1.2)
Total Protein: 7.1 g/dL (ref 6.5–8.1)

## 2019-06-10 MED ORDER — SODIUM CHLORIDE 0.9% FLUSH
10.0000 mL | INTRAVENOUS | Status: DC | PRN
  Administered 2019-06-10: 10 mL
  Filled 2019-06-10: qty 10

## 2019-06-10 MED ORDER — FLUOROURACIL CHEMO INJECTION 2.5 GM/50ML
400.0000 mg/m2 | Freq: Once | INTRAVENOUS | Status: AC
  Administered 2019-06-10: 750 mg via INTRAVENOUS
  Filled 2019-06-10: qty 15

## 2019-06-10 MED ORDER — PALONOSETRON HCL INJECTION 0.25 MG/5ML
0.2500 mg | Freq: Once | INTRAVENOUS | Status: AC
  Administered 2019-06-10: 0.25 mg via INTRAVENOUS

## 2019-06-10 MED ORDER — DEXAMETHASONE SODIUM PHOSPHATE 10 MG/ML IJ SOLN
10.0000 mg | Freq: Once | INTRAMUSCULAR | Status: AC
  Administered 2019-06-10: 10 mg via INTRAVENOUS

## 2019-06-10 MED ORDER — SODIUM CHLORIDE 0.9 % IV SOLN
2400.0000 mg/m2 | INTRAVENOUS | Status: DC
  Administered 2019-06-10: 4500 mg via INTRAVENOUS
  Filled 2019-06-10: qty 90

## 2019-06-10 MED ORDER — LEUCOVORIN CALCIUM INJECTION 350 MG
400.0000 mg/m2 | Freq: Once | INTRAVENOUS | Status: AC
  Administered 2019-06-10: 748 mg via INTRAVENOUS
  Filled 2019-06-10: qty 37.4

## 2019-06-10 MED ORDER — OXALIPLATIN CHEMO INJECTION 100 MG/20ML
65.0000 mg/m2 | Freq: Once | INTRAVENOUS | Status: AC
  Administered 2019-06-10: 120 mg via INTRAVENOUS
  Filled 2019-06-10: qty 24

## 2019-06-10 MED ORDER — DEXTROSE 5 % IV SOLN
Freq: Once | INTRAVENOUS | Status: AC
  Administered 2019-06-10: 12:00:00 via INTRAVENOUS
  Filled 2019-06-10: qty 250

## 2019-06-10 MED ORDER — DEXAMETHASONE SODIUM PHOSPHATE 10 MG/ML IJ SOLN
INTRAMUSCULAR | Status: AC
  Filled 2019-06-10: qty 1

## 2019-06-10 MED ORDER — PALONOSETRON HCL INJECTION 0.25 MG/5ML
INTRAVENOUS | Status: AC
  Filled 2019-06-10: qty 5

## 2019-06-10 NOTE — Progress Notes (Signed)
At patient request faxed office note from 06/10/19 to [employer] STD att: Mervyn Skeeters at [phone]. Confirmation receipt received.

## 2019-06-10 NOTE — Patient Instructions (Signed)
[facility] Discharge Instructions for Patients Receiving Chemotherapy  Today you received the following chemotherapy agents: Oxaliplatin, Leucovorin, and 5FU.  To help prevent nausea and vomiting after your treatment, we encourage you to take your nausea medication as directed.   If you develop nausea and vomiting that is not controlled by your nausea medication, call the clinic.   BELOW ARE SYMPTOMS THAT SHOULD BE REPORTED IMMEDIATELY:  *FEVER GREATER THAN 100.5 F  *CHILLS WITH OR WITHOUT FEVER  NAUSEA AND VOMITING THAT IS NOT CONTROLLED WITH YOUR NAUSEA MEDICATION  *UNUSUAL SHORTNESS OF BREATH  *UNUSUAL BRUISING OR BLEEDING  TENDERNESS IN MOUTH AND THROAT WITH OR WITHOUT PRESENCE OF ULCERS  *URINARY PROBLEMS  *BOWEL PROBLEMS  UNUSUAL RASH Items with * indicate a potential emergency and should be followed up as soon as possible.  Feel free to call the clinic should you have any questions or concerns. The clinic phone number is [phone].  Please show the CHEMO ALERT CARD at check-in to the Emergency Department and triage nurse.    

## 2019-06-10 NOTE — Progress Notes (Signed)
  [place] OFFICE PROGRESS NOTE   Diagnosis: Colon cancer  INTERVAL HISTORY:   [name] completed another cycle of FOLFOX on 05/27/2019.  No nausea/vomiting, mouth sores, or diarrhea.  He reports "heartburn "beginning on the day after chemotherapy and lasting for a few days.  No associated symptoms.  The heartburn was relieved partially with drinking milk and taking Tums.  He has cold sensitivity following chemotherapy.  Mild numbness in the fingers.  He has cold sensitivity in the feet.  Objective:  Vital signs in last 24 hours:  Blood pressure 127/80, pulse 79, temperature 98 F (36.7 C), temperature source Temporal, resp. rate 18, height '5\' 7"'$  (1.702 m), weight 165 lb 12.8 oz (75.2 kg), SpO2 98 %.     Cardio: Regular rate and rhythm GI: No hepatomegaly, no mass, nontender Vascular: No leg edema Neuro: The vibratory sense is intact at the fingertips bilaterally Skin: Mild hyperpigmentation of the hands Musculoskeletal: No anterior chest wall tenderness  Portacath/PICC-without erythema  Lab Results:  Lab Results  Component Value Date   WBC 10.9 (H) 06/10/2019   HGB 13.5 06/10/2019   HCT 42.3 06/10/2019   MCV 80.0 06/10/2019   PLT 124 (L) 06/10/2019   NEUTROABS 6.9 06/10/2019    CMP  Lab Results  Component Value Date   NA 138 05/27/2019   K 4.2 05/27/2019   CL 105 05/27/2019   CO2 23 05/27/2019   GLUCOSE 147 (H) 05/27/2019   BUN 11 05/27/2019   CREATININE 0.83 05/27/2019   CALCIUM 8.9 05/27/2019   PROT 7.3 05/27/2019   ALBUMIN 3.9 05/27/2019   AST 22 05/27/2019   ALT 41 05/27/2019   ALKPHOS 151 (H) 05/27/2019   BILITOT 0.3 05/27/2019   GFRNONAA >60 05/27/2019   GFRAA >60 05/27/2019    No results found for: CEA1  No results found for: INR  Imaging:  No results found.  Medications: I have reviewed the patient's current medications.   Assessment/Plan: 1. Sigmoid colon cancer,stage IIIb (pT4a,pN1c), status post a partial left  colectomy 03/11/2019 ? Microsatellite stable, no loss of mismatch repair protein expression ? CTs 12/24/2018-sigmoid: Wall thickening, multiple enlarged nodes adjacent to the sigmoid colon, no evidence of metastatic disease ? Cycle 1 FOLFOX 04/14/2019 ? Cycle 2 FOLFOX 04/30/2019, Udenyca added ? Cycle 3 FOLFOX 05/14/2019 (oxaliplatin dose reduced to 65 mg/m due to thrombocytopenia) ? Cycle 4 FOLFOX 05/27/2019 (continue oxaliplatin dose reduction to '65mg'$ /m2, PLT 129K) ? Cycle 5 FOLFOX 06/10/2019 2. COVID-19 respiratory infection June 2020 3. Diabetes diagnosed June 2020 4. Port-A-Cath placement, Dr. Johney Maine, 04/09/2019 5. "Heartburn "following chemotherapy, Prilosec added 06/10/2019    Disposition: Mr. Quale continues to tolerate chemotherapy well.  He will try Prilosec for the dyspepsia.  He will let us know if this does not help.  He will complete another cycle of FOLFOX today.  He will return for an office visit and chemotherapy in 2 weeks.  Betsy Coder, MD  06/10/2019  11:32 AM

## 2019-06-11 ENCOUNTER — Telehealth: Payer: Self-pay | Admitting: *Deleted

## 2019-06-11 NOTE — Telephone Encounter (Addendum)
After faxing office note per patient request. Received via fax STD form to complete to extend his STD. Patient reports it ends 06/23/2019. Forwarded to inbox for disability nurse to complete. Patient called to inquire if his STD form sent by Mervyn Skeeters had been completed yet. Informed him it was forwarded to the nurse that completes disability paperwork and can take 7-10 business days. In future if call is regarding forms, he needs to ask for the disability/FMLA nurse.

## 2019-06-12 ENCOUNTER — Inpatient Hospital Stay: Payer: BC Managed Care – PPO

## 2019-06-12 ENCOUNTER — Other Ambulatory Visit: Payer: Self-pay

## 2019-06-12 VITALS — BP 123/75 | HR 81 | Temp 98.2°F | Resp 18

## 2019-06-12 DIAGNOSIS — C187 Malignant neoplasm of sigmoid colon: Secondary | ICD-10-CM | POA: Diagnosis not present

## 2019-06-12 MED ORDER — PEGFILGRASTIM-CBQV 6 MG/0.6ML ~~LOC~~ SOSY
6.0000 mg | PREFILLED_SYRINGE | Freq: Once | SUBCUTANEOUS | Status: AC
  Administered 2019-06-12: 6 mg via SUBCUTANEOUS

## 2019-06-12 MED ORDER — HEPARIN SOD (PORK) LOCK FLUSH 100 UNIT/ML IV SOLN
500.0000 [IU] | Freq: Once | INTRAVENOUS | Status: AC | PRN
  Administered 2019-06-12: 500 [IU]
  Filled 2019-06-12: qty 5

## 2019-06-12 MED ORDER — SODIUM CHLORIDE 0.9% FLUSH
10.0000 mL | INTRAVENOUS | Status: DC | PRN
  Administered 2019-06-12: 10 mL
  Filled 2019-06-12: qty 10

## 2019-06-12 MED ORDER — PEGFILGRASTIM-CBQV 6 MG/0.6ML ~~LOC~~ SOSY
PREFILLED_SYRINGE | SUBCUTANEOUS | Status: AC
  Filled 2019-06-12: qty 0.6

## 2019-06-12 NOTE — Patient Instructions (Signed)

## 2019-06-15 NOTE — Telephone Encounter (Signed)
Received another Return to Work Armed forces technical officer Form from [employer] at [place]. Forwarded to disability Haematologist.

## 2019-06-21 ENCOUNTER — Other Ambulatory Visit: Payer: Self-pay | Admitting: Oncology

## 2019-06-23 NOTE — Progress Notes (Signed)
[place]   Telephone:(336) [phone] Fax:(336) [phone]   Clinic Follow up Note   Patient Care Team: Enid Skeens., MD as PCP - General (Family Medicine) Ladell Pier, MD as Consulting Physician (Oncology) Jackquline Denmark, MD as Consulting Physician (Gastroenterology) Michael Boston, MD as Consulting Physician (General Surgery) 06/24/2019  CHIEF COMPLAINT: F/u colon cancer   CURRENT THERAPY: Adjuvant FOLFOX  INTERVAL HISTORY: Oscar Mccarty returns for f/u as scheduled. He completed cycle 5 FOLFOX on 06/10/19. He feel tired with lower appetite and mild nausea for up to 3 days after pump d/c, then recovers. Anti-emetics help. Heartburn resolves with prilosec. He has constipation during chemo that resolves with miralax. Denies diarrhea. No mouth sores. Drinks water, crystal light, and diet sodas. Cold sensitivity and sensitivity with clapping lasts 3-4 days, no other neuropathy. Denies recent fever, chills, cough, chest pain, dyspnea, leg swelling, or abdominal pain.    MEDICAL HISTORY:  Past Medical History:  Diagnosis Date  . Acute respiratory disease due to COVID-19 virus - June 2020 01/05/2019  . 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 Allergic rhinitis   . Chronic headaches   . Depression   . Diabetes mellitus, type 2 ([place]) 12/25/2018   new diagnosis, A1C 9.2% 12/25/2018  . Displaced fracture of distal phalanx of left middle finger 04/03/2016  . GERD (gastroesophageal reflux disease)   . History of COVID-19 respiratory pneumonitis June 2020 03/11/2019  . 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 COLONOSCOPY    . no surgical history    . PORTACATH PLACEMENT N/A 04/09/2019   Procedure: PLACEMENT OF PORT-A-CATHETER UNDER ULTRASOUND AND FLUOROSCOPIC GUIDANCE;  Surgeon:  Michael Boston, MD;  Location: [facility];  Service: General;  Laterality: N/A;  . PROCTOSCOPY N/A 03/11/2019   Procedure: RIGID PROCTOSCOPY;  Surgeon: Michael Boston, MD;  Location: [facility];  Service: General;  Laterality: N/A;  . XI ROBOTIC ASSISTED COLOSTOMY TAKEDOWN N/A 03/11/2019   Procedure: XI ROBOTIC ASSISTED LEFT COLECTOMY, MOBILIZATION OF SPLENIC FLEXURE, BILATERAL TAP BLOCK;  Surgeon: Michael Boston, MD;  Location: [facility];  Service: General;  Laterality: N/A;    I have reviewed the social history and family history with the patient and they are unchanged from previous note.  ALLERGIES:  has No Known Allergies.  MEDICATIONS:  Current Outpatient Medications  Medication Sig Dispense Refill  . atorvastatin (LIPITOR) 10 MG tablet Take 10 mg by mouth daily.    Marland Kitchen ibuprofen (ADVIL) 200 MG tablet Take 400 mg by mouth every 6 (six) hours as needed for headache or moderate pain.    Marland Kitchen lidocaine-prilocaine (EMLA) cream Apply 1 application topically as needed. 30 g 0  . lisinopril (ZESTRIL) 2.5 MG tablet Take 2.5 mg by mouth daily.    . metFORMIN (GLUCOPHAGE) 500 MG tablet Take 1 tablet (500 mg total) by mouth 2 (two) times daily with a meal. 60 tablet 11  . omeprazole (PRILOSEC OTC) 20 MG tablet Take 20 mg by mouth daily. Take for 4 days after each chemotherapy    . prochlorperazine (COMPAZINE) 10 MG tablet Take 1 tablet (10 mg total) by mouth every 6 (six) hours as needed for nausea or vomiting. 30 tablet 0  . traMADol (ULTRAM) 50 MG tablet Take 1-2 tablets (50-100 mg total) by mouth every 6 (six) hours as needed for  moderate pain or severe pain. 20 tablet 0   No current facility-administered medications for this visit.    Facility-Administered Medications Ordered in Other Visits  Medication Dose Route Frequency Provider Last Rate Last Dose  . fluorouracil (ADRUCIL) 4,500 mg in sodium chloride 0.9 % 60 mL chemo infusion  2,400 mg/m2 (Treatment Plan Recorded) Intravenous 1 day or 1 dose Ladell Pier, MD      . fluorouracil (ADRUCIL) chemo injection 750 mg  400 mg/m2 (Treatment Plan Recorded) Intravenous Once Ladell Pier, MD      . leucovorin 748 mg in dextrose 5 % 250 mL infusion  400 mg/m2 (Treatment Plan Recorded) Intravenous Once Ladell Pier, MD      . oxaliplatin (ELOXATIN) 120 mg in dextrose 5 % 500 mL chemo infusion  65 mg/m2 (Treatment Plan Recorded) Intravenous Once Ladell Pier, MD        PHYSICAL EXAMINATION: ECOG PERFORMANCE STATUS: 1 - Symptomatic but completely ambulatory  Vitals:   06/24/19 1108  BP: 107/72  Pulse: 73  Resp: 17  Temp: 98 F (36.7 C)  SpO2: 100%   Filed Weights   06/24/19 1108  Weight: 166 lb (75.3 kg)    GENERAL:alert, no distress and comfortable SKIN: no rash  EYES: sclera clear OROPHARYNX:no thrush or ulcers  NECK: without mass LUNGS: clear with normal breathing effort HEART: regular rate & rhythm ABDOMEN:abdomen soft, non-tender and normal bowel sounds Musculoskeletal: no lower extremity edema  NEURO: alert & oriented x 3 with fluent speech, no focal motor/sensory deficits PAC without erythema   LABORATORY DATA:  I have reviewed the data as listed CBC Latest Ref Rng & Units 06/24/2019 06/10/2019 05/27/2019  WBC 4.0 - 10.5 K/uL 8.4 10.9(H) 9.8  Hemoglobin 13.0 - 17.0 g/dL 13.4 13.5 13.5  Hematocrit 39.0 - 52.0 % 41.8 42.3 41.7  Platelets 150 - 400 K/uL 115(L) 124(L) 129(L)     CMP Latest Ref Rng & Units 06/24/2019 06/10/2019 05/27/2019  Glucose 70 - 99 mg/dL 204(H) 225(H) 147(H)  BUN 6 - 20 mg/dL 11 10 11   Creatinine 0.61 - 1.24 mg/dL 1.03 0.89 0.83  Sodium 135 - 145 mmol/L 137 138 138  Potassium 3.5 - 5.1 mmol/L 4.2 4.2 4.2  Chloride 98 - 111 mmol/L 104 104 105  CO2 22 - 32 mmol/L 24 23 23   Calcium 8.9 - 10.3 mg/dL 8.6(L) 8.6(L) 8.9  Total Protein 6.5 - 8.1 g/dL 6.9 7.1 7.3  Total Bilirubin 0.3 - 1.2 mg/dL 0.4 0.3 0.3  Alkaline Phos 38 - 126 U/L 178(H) 176(H) 151(H)  AST 15 - 41 U/L 27 24 22   ALT 0 - 44 U/L  42 37 41      RADIOGRAPHIC STUDIES: I have personally reviewed the radiological images as listed and agreed with the findings in the report. No results found.   ASSESSMENT & PLAN:  1. 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 Microsatellite stable, no loss of mismatch repair protein expression ? CTs 12/24/2018-sigmoid: Wall thickening, multiple enlarged nodes adjacent to the sigmoid colon, no evidence of metastatic disease ? Cycle 1 FOLFOX 04/14/2019 ? Cycle 2 FOLFOX 04/30/2019, Udenyca added ? Cycle 3 FOLFOX 05/14/2019 (oxaliplatin dose reduced to 65 mg/m due to thrombocytopenia) ? Cycle 4 FOLFOX 05/27/2019 (continue oxaliplatin dose reduction to 32m/m2, PLT 129K) ? Cycle 5 FOLFOX 06/10/2019 ? Cycle 6 FOLFOX 06/24/2019 2. COVID-19 respiratory infection June 2020 3. Diabetes diagnosed June 2020 4. Port-A-Cath placement, Dr. GJohney Maine 04/09/2019  5. Heartburn following chemotherapy, prilosec added 06/10/19 6. Periodic dizziness on standing, no significant orthostatic hypotension. Hold lisinopril as of 06/24/19   Disposition:  Mr. Nigh appears well. He completed 5 cycles of adjuvant FOLFOX, he tolerates treatment well with mild fatigue, low appetite, nausea and cold sensitivity. He is able to recover well. Labs reviewed, PLT remain stable with dose-reduced Oxaliplatin. He will proceed with cycle 6 FOLFOX today, continue current dose. He will return in 2 weeks for f/u and next cycle.   He developed intermittent positional dizziness, no fall. BP is low-normal for him with mild drop in SBP on standing. He thinks this started when he increased metformin to 2 tabs BID. BG 204 today. I recommend to hold lisinopril and increase po liquids. Will monitor closely.   All questions were answered. The patient knows to call the clinic with any problems, questions or concerns. No barriers to learning were detected. The plan was reviewed with Dr. Benay Spice.       Alla Feeling, NP 06/24/19

## 2019-06-24 ENCOUNTER — Other Ambulatory Visit: Payer: Self-pay

## 2019-06-24 ENCOUNTER — Telehealth: Payer: Self-pay | Admitting: Nurse Practitioner

## 2019-06-24 ENCOUNTER — Inpatient Hospital Stay: Payer: BC Managed Care – PPO

## 2019-06-24 ENCOUNTER — Encounter: Payer: Self-pay | Admitting: Nurse Practitioner

## 2019-06-24 ENCOUNTER — Inpatient Hospital Stay: Payer: BC Managed Care – PPO | Attending: Oncology

## 2019-06-24 ENCOUNTER — Inpatient Hospital Stay (HOSPITAL_BASED_OUTPATIENT_CLINIC_OR_DEPARTMENT_OTHER): Payer: BC Managed Care – PPO | Admitting: Nurse Practitioner

## 2019-06-24 VITALS — BP 107/72 | HR 73 | Temp 98.0°F | Resp 17 | Ht 67.0 in | Wt 166.0 lb

## 2019-06-24 DIAGNOSIS — Z5111 Encounter for antineoplastic chemotherapy: Secondary | ICD-10-CM | POA: Insufficient documentation

## 2019-06-24 DIAGNOSIS — C187 Malignant neoplasm of sigmoid colon: Secondary | ICD-10-CM

## 2019-06-24 DIAGNOSIS — Z5189 Encounter for other specified aftercare: Secondary | ICD-10-CM | POA: Insufficient documentation

## 2019-06-24 DIAGNOSIS — Z95828 Presence of other vascular implants and grafts: Secondary | ICD-10-CM

## 2019-06-24 LAB — CMP (CANCER CENTER ONLY)
ALT: 42 U/L (ref 0–44)
AST: 27 U/L (ref 15–41)
Albumin: 3.8 g/dL (ref 3.5–5.0)
Alkaline Phosphatase: 178 U/L — ABNORMAL HIGH (ref 38–126)
Anion gap: 9 (ref 5–15)
BUN: 11 mg/dL (ref 6–20)
CO2: 24 mmol/L (ref 22–32)
Calcium: 8.6 mg/dL — ABNORMAL LOW (ref 8.9–10.3)
Chloride: 104 mmol/L (ref 98–111)
Creatinine: 1.03 mg/dL (ref 0.61–1.24)
GFR, Est AFR Am: >60 mL/min (ref >60)
GFR, Estimated: >60 mL/min (ref >60)
Glucose, Bld: 204 mg/dL — ABNORMAL HIGH (ref 70–99)
Potassium: 4.2 mmol/L (ref 3.5–5.1)
Sodium: 137 mmol/L (ref 135–145)
Total Bilirubin: 0.4 mg/dL (ref 0.3–1.2)
Total Protein: 6.9 g/dL (ref 6.5–8.1)

## 2019-06-24 LAB — CBC WITH DIFFERENTIAL (CANCER CENTER ONLY)
Abs Immature Granulocytes: 0.05 10*3/uL (ref 0.00–0.07)
Basophils Absolute: 0.1 10*3/uL (ref 0.0–0.1)
Basophils Relative: 1 %
Eosinophils Absolute: 0.1 10*3/uL (ref 0.0–0.5)
Eosinophils Relative: 1 %
HCT: 41.8 % (ref 39.0–52.0)
Hemoglobin: 13.4 g/dL (ref 13.0–17.0)
Immature Granulocytes: 1 %
Lymphocytes Relative: 21 %
Lymphs Abs: 1.7 10*3/uL (ref 0.7–4.0)
MCH: 26 pg (ref 26.0–34.0)
MCHC: 32.1 g/dL (ref 30.0–36.0)
MCV: 81.2 fL (ref 80.0–100.0)
Monocytes Absolute: 0.7 10*3/uL (ref 0.1–1.0)
Monocytes Relative: 8 %
Neutro Abs: 5.8 10*3/uL (ref 1.7–7.7)
Neutrophils Relative %: 68 %
Platelet Count: 115 10*3/uL — ABNORMAL LOW (ref 150–400)
RBC: 5.15 MIL/uL (ref 4.22–5.81)
RDW: 19.5 % — ABNORMAL HIGH (ref 11.5–15.5)
WBC Count: 8.4 10*3/uL (ref 4.0–10.5)
nRBC: 0 % (ref 0.0–0.2)

## 2019-06-24 MED ORDER — DEXAMETHASONE SODIUM PHOSPHATE 10 MG/ML IJ SOLN
INTRAMUSCULAR | Status: AC
  Filled 2019-06-24: qty 1

## 2019-06-24 MED ORDER — PALONOSETRON HCL INJECTION 0.25 MG/5ML
0.2500 mg | Freq: Once | INTRAVENOUS | Status: AC
  Administered 2019-06-24: 0.25 mg via INTRAVENOUS

## 2019-06-24 MED ORDER — LEUCOVORIN CALCIUM INJECTION 350 MG
400.0000 mg/m2 | Freq: Once | INTRAVENOUS | Status: AC
  Administered 2019-06-24: 748 mg via INTRAVENOUS
  Filled 2019-06-24: qty 37.4

## 2019-06-24 MED ORDER — OXALIPLATIN CHEMO INJECTION 100 MG/20ML
65.0000 mg/m2 | Freq: Once | INTRAVENOUS | Status: AC
  Administered 2019-06-24: 120 mg via INTRAVENOUS
  Filled 2019-06-24: qty 20

## 2019-06-24 MED ORDER — SODIUM CHLORIDE 0.9% FLUSH
10.0000 mL | INTRAVENOUS | Status: DC | PRN
  Administered 2019-06-24: 10 mL
  Filled 2019-06-24: qty 10

## 2019-06-24 MED ORDER — DEXAMETHASONE SODIUM PHOSPHATE 10 MG/ML IJ SOLN
10.0000 mg | Freq: Once | INTRAMUSCULAR | Status: AC
  Administered 2019-06-24: 10 mg via INTRAVENOUS

## 2019-06-24 MED ORDER — SODIUM CHLORIDE 0.9 % IV SOLN
2400.0000 mg/m2 | INTRAVENOUS | Status: DC
  Administered 2019-06-24: 4500 mg via INTRAVENOUS
  Filled 2019-06-24: qty 90

## 2019-06-24 MED ORDER — PALONOSETRON HCL INJECTION 0.25 MG/5ML
INTRAVENOUS | Status: AC
  Filled 2019-06-24: qty 5

## 2019-06-24 MED ORDER — DEXTROSE 5 % IV SOLN
Freq: Once | INTRAVENOUS | Status: AC
  Administered 2019-06-24: 12:00:00 via INTRAVENOUS
  Filled 2019-06-24: qty 250

## 2019-06-24 MED ORDER — FLUOROURACIL CHEMO INJECTION 2.5 GM/50ML
400.0000 mg/m2 | Freq: Once | INTRAVENOUS | Status: AC
  Administered 2019-06-24: 750 mg via INTRAVENOUS
  Filled 2019-06-24: qty 15

## 2019-06-24 NOTE — Patient Instructions (Signed)
Hyperglycemia Hyperglycemia is when the sugar (glucose) level in your blood is too high. It may not cause symptoms. If you do have symptoms, they may include warning signs, such as:  Feeling more thirsty than normal.  Hunger.  Feeling tired.  Needing to pee (urinate) more than normal.  Blurry eyesight (vision). You may get other symptoms as it gets worse, such as:  Dry mouth.  Not being hungry (loss of appetite).  Fruity-smelling breath.  Weakness.  Weight gain or loss that is not planned. Weight loss may be fast.  A tingling or numb feeling in your hands or feet.  Headache.  Skin that does not bounce back quickly when it is lightly pinched and released (poor skin turgor).  Pain in your belly (abdomen).  Cuts or bruises that heal slowly. High blood sugar can happen to people who do or do not have diabetes. High blood sugar can happen slowly or quickly, and it can be an emergency. Follow these instructions at home: General instructions  Take over-the-counter and prescription medicines only as told by your doctor.  Do not use products that contain nicotine or tobacco, such as cigarettes and e-cigarettes. If you need help quitting, ask your doctor.  Limit alcohol intake to no more than 1 drink per day for nonpregnant women and 2 drinks per day for men. One drink equals 12 oz of beer, 5 oz of wine, or 1 oz of hard liquor.  Manage stress. If you need help with this, ask your doctor.  Keep all follow-up visits as told by your doctor. This is important. Eating and drinking   Stay at a healthy weight.  Exercise regularly, as told by your doctor.  Drink enough fluid, especially when you: ? Exercise. ? Get sick. ? Are in hot temperatures.  Eat healthy foods, such as: ? Low-fat (lean) proteins. ? Complex carbs (complex carbohydrates), such as whole wheat bread or brown rice. ? Fresh fruits and vegetables. ? Low-fat dairy products. ? Healthy fats.  Drink enough  fluid to keep your pee (urine) clear or pale yellow. If you have diabetes:   Make sure you know the symptoms of hyperglycemia.  Follow your diabetes management plan, as told by your doctor. Make sure you: ? Take insulin and medicines as told. ? Follow your exercise plan. ? Follow your meal plan. Eat on time. Do not skip meals. ? Check your blood sugar as often as told. Make sure to check before and after exercise. If you exercise longer or in a different way than you normally do, check your blood sugar more often. ? Follow your sick day plan whenever you cannot eat or drink normally. Make this plan ahead of time with your doctor.  Share your diabetes management plan with people in your workplace, school, and household.  Check your urine for ketones when you are ill and as told by your doctor.  Carry a card or wear jewelry that says that you have diabetes. Contact a doctor if:  Your blood sugar level is higher than 240 mg/dL (13.3 mmol/L) for 2 days in a row.  You have problems keeping your blood sugar in your target range.  High blood sugar happens often for you. Get help right away if:  You have trouble breathing.  You have a change in how you think, feel, or act (mental status).  You feel sick to your stomach (nauseous), and that feeling does not go away.  You cannot stop throwing up (vomiting). These symptoms may   be an emergency. Do not wait to see if the symptoms will go away. Get medical help right away. Call your local emergency services (911 in the [place] Do not drive yourself to the hospital. Summary  Hyperglycemia is when the sugar (glucose) level in your blood is too high.  High blood sugar can happen to people who do or do not have diabetes.  Make sure you drink enough fluids, eat healthy foods, and exercise regularly.  Contact your doctor if you have problems keeping your blood sugar in your target range. This information is not intended to replace advice given  to you by your health care provider. Make sure you discuss any questions you have with your health care provider. Document Released: 05/06/2009 Document Revised: 03/26/2016 Document Reviewed: 03/26/2016 Elsevier Patient Education  2020 [place] Discharge Instructions for Patients Receiving Chemotherapy  Today you received the following chemotherapy agents: Oxaliplatin, Leucovorin, and 5FU.  To help prevent nausea and vomiting after your treatment, we encourage you to take your nausea medication as directed.   If you develop nausea and vomiting that is not controlled by your nausea medication, call the clinic.   BELOW ARE SYMPTOMS THAT SHOULD BE REPORTED IMMEDIATELY:  *FEVER GREATER THAN 100.5 F  *CHILLS WITH OR WITHOUT FEVER  NAUSEA AND VOMITING THAT IS NOT CONTROLLED WITH YOUR NAUSEA MEDICATION  *UNUSUAL SHORTNESS OF BREATH  *UNUSUAL BRUISING OR BLEEDING  TENDERNESS IN MOUTH AND THROAT WITH OR WITHOUT PRESENCE OF ULCERS  *URINARY PROBLEMS  *BOWEL PROBLEMS  UNUSUAL RASH Items with * indicate a potential emergency and should be followed up as soon as possible.  Feel free to call the clinic should you have any questions or concerns. The clinic phone number is [phone].  Please show the [facility] at check-in to the Emergency Department and triage nurse.

## 2019-06-24 NOTE — Progress Notes (Signed)
CBG 204 today, pt states he has no s/s hyperglycemia. This RN went over s/s of hyperglycemia, advised pt to check CBG when gets home and to call [facility] if any changes, pt agreed and verbalized understanding. Notified  Cira Rue NP re: CBG 204. No new orders

## 2019-06-24 NOTE — Telephone Encounter (Signed)
Scheduled appt per 12/2 los. ° ° °

## 2019-06-25 ENCOUNTER — Telehealth: Payer: Self-pay | Admitting: *Deleted

## 2019-06-25 NOTE — Telephone Encounter (Signed)
"  [place] [phone]).  I have not worked since 03/08/2019, before surgery 03/11/2019.  I need forms completed for Long-Term Disability.  Currently on  My short-term disability ends December 29th.  Will drop off new forms tomorrow.  Would like to pick up by December 16th appt to get things changed without a gap due to Christmas Holiday coming up."

## 2019-06-26 ENCOUNTER — Other Ambulatory Visit: Payer: Self-pay

## 2019-06-26 ENCOUNTER — Inpatient Hospital Stay: Payer: BC Managed Care – PPO

## 2019-06-26 VITALS — BP 118/80 | HR 82 | Temp 98.6°F | Resp 18

## 2019-06-26 DIAGNOSIS — C187 Malignant neoplasm of sigmoid colon: Secondary | ICD-10-CM

## 2019-06-26 MED ORDER — HEPARIN SOD (PORK) LOCK FLUSH 100 UNIT/ML IV SOLN
500.0000 [IU] | Freq: Once | INTRAVENOUS | Status: AC | PRN
  Administered 2019-06-26: 500 [IU]
  Filled 2019-06-26: qty 5

## 2019-06-26 MED ORDER — SODIUM CHLORIDE 0.9% FLUSH
10.0000 mL | INTRAVENOUS | Status: DC | PRN
  Administered 2019-06-26: 10 mL
  Filled 2019-06-26: qty 10

## 2019-06-26 MED ORDER — PEGFILGRASTIM-CBQV 6 MG/0.6ML ~~LOC~~ SOSY
PREFILLED_SYRINGE | SUBCUTANEOUS | Status: AC
  Filled 2019-06-26: qty 0.6

## 2019-06-26 MED ORDER — PEGFILGRASTIM-CBQV 6 MG/0.6ML ~~LOC~~ SOSY
6.0000 mg | PREFILLED_SYRINGE | Freq: Once | SUBCUTANEOUS | Status: AC
  Administered 2019-06-26: 6 mg via SUBCUTANEOUS

## 2019-07-05 ENCOUNTER — Other Ambulatory Visit: Payer: Self-pay | Admitting: Oncology

## 2019-07-07 ENCOUNTER — Other Ambulatory Visit: Payer: Self-pay | Admitting: *Deleted

## 2019-07-07 DIAGNOSIS — C187 Malignant neoplasm of sigmoid colon: Secondary | ICD-10-CM

## 2019-07-08 ENCOUNTER — Inpatient Hospital Stay: Payer: BC Managed Care – PPO

## 2019-07-08 ENCOUNTER — Inpatient Hospital Stay: Payer: BC Managed Care – PPO | Admitting: Nutrition

## 2019-07-08 ENCOUNTER — Encounter: Payer: Self-pay | Admitting: Nurse Practitioner

## 2019-07-08 ENCOUNTER — Other Ambulatory Visit: Payer: Self-pay

## 2019-07-08 ENCOUNTER — Inpatient Hospital Stay (HOSPITAL_BASED_OUTPATIENT_CLINIC_OR_DEPARTMENT_OTHER): Payer: BC Managed Care – PPO | Admitting: Nurse Practitioner

## 2019-07-08 VITALS — BP 110/75 | HR 77 | Temp 97.9°F | Resp 18 | Ht 67.0 in | Wt 162.2 lb

## 2019-07-08 DIAGNOSIS — Z95828 Presence of other vascular implants and grafts: Secondary | ICD-10-CM

## 2019-07-08 DIAGNOSIS — C187 Malignant neoplasm of sigmoid colon: Secondary | ICD-10-CM

## 2019-07-08 LAB — CMP (CANCER CENTER ONLY)
ALT: 62 U/L — ABNORMAL HIGH (ref 0–44)
AST: 40 U/L (ref 15–41)
Albumin: 4 g/dL (ref 3.5–5.0)
Alkaline Phosphatase: 192 U/L — ABNORMAL HIGH (ref 38–126)
Anion gap: 9 (ref 5–15)
BUN: 10 mg/dL (ref 6–20)
CO2: 25 mmol/L (ref 22–32)
Calcium: 9.1 mg/dL (ref 8.9–10.3)
Chloride: 104 mmol/L (ref 98–111)
Creatinine: 0.87 mg/dL (ref 0.61–1.24)
GFR, Est AFR Am: >60 mL/min (ref >60)
GFR, Estimated: >60 mL/min (ref >60)
Glucose, Bld: 197 mg/dL — ABNORMAL HIGH (ref 70–99)
Potassium: 4.4 mmol/L (ref 3.5–5.1)
Sodium: 138 mmol/L (ref 135–145)
Total Bilirubin: 0.4 mg/dL (ref 0.3–1.2)
Total Protein: 7.2 g/dL (ref 6.5–8.1)

## 2019-07-08 LAB — CBC WITH DIFFERENTIAL (CANCER CENTER ONLY)
Abs Immature Granulocytes: 0.09 10*3/uL — ABNORMAL HIGH (ref 0.00–0.07)
Basophils Absolute: 0 10*3/uL (ref 0.0–0.1)
Basophils Relative: 1 %
Eosinophils Absolute: 0.2 10*3/uL (ref 0.0–0.5)
Eosinophils Relative: 2 %
HCT: 43.4 % (ref 39.0–52.0)
Hemoglobin: 13.9 g/dL (ref 13.0–17.0)
Immature Granulocytes: 1 %
Lymphocytes Relative: 20 %
Lymphs Abs: 1.7 10*3/uL (ref 0.7–4.0)
MCH: 26.4 pg (ref 26.0–34.0)
MCHC: 32 g/dL (ref 30.0–36.0)
MCV: 82.4 fL (ref 80.0–100.0)
Monocytes Absolute: 0.7 10*3/uL (ref 0.1–1.0)
Monocytes Relative: 8 %
Neutro Abs: 5.7 10*3/uL (ref 1.7–7.7)
Neutrophils Relative %: 68 %
Platelet Count: 104 10*3/uL — ABNORMAL LOW (ref 150–400)
RBC: 5.27 MIL/uL (ref 4.22–5.81)
RDW: 21.1 % — ABNORMAL HIGH (ref 11.5–15.5)
WBC Count: 8.4 10*3/uL (ref 4.0–10.5)
nRBC: 0 % (ref 0.0–0.2)

## 2019-07-08 MED ORDER — LEUCOVORIN CALCIUM INJECTION 350 MG
400.0000 mg/m2 | Freq: Once | INTRAVENOUS | Status: AC
  Administered 2019-07-08: 11:00:00 748 mg via INTRAVENOUS
  Filled 2019-07-08: qty 37.4

## 2019-07-08 MED ORDER — DEXAMETHASONE SODIUM PHOSPHATE 10 MG/ML IJ SOLN
INTRAMUSCULAR | Status: AC
  Filled 2019-07-08: qty 1

## 2019-07-08 MED ORDER — SODIUM CHLORIDE 0.9% FLUSH
10.0000 mL | INTRAVENOUS | Status: DC | PRN
  Administered 2019-07-08: 14:00:00 10 mL
  Filled 2019-07-08: qty 10

## 2019-07-08 MED ORDER — DEXTROSE 5 % IV SOLN
Freq: Once | INTRAVENOUS | Status: AC
  Filled 2019-07-08: qty 250

## 2019-07-08 MED ORDER — FLUOROURACIL CHEMO INJECTION 2.5 GM/50ML
400.0000 mg/m2 | Freq: Once | INTRAVENOUS | Status: AC
  Administered 2019-07-08: 13:00:00 750 mg via INTRAVENOUS
  Filled 2019-07-08: qty 15

## 2019-07-08 MED ORDER — DEXAMETHASONE SODIUM PHOSPHATE 10 MG/ML IJ SOLN
10.0000 mg | Freq: Once | INTRAMUSCULAR | Status: AC
  Administered 2019-07-08: 10:00:00 10 mg via INTRAVENOUS

## 2019-07-08 MED ORDER — PALONOSETRON HCL INJECTION 0.25 MG/5ML
0.2500 mg | Freq: Once | INTRAVENOUS | Status: AC
  Administered 2019-07-08: 0.25 mg via INTRAVENOUS

## 2019-07-08 MED ORDER — SODIUM CHLORIDE 0.9% FLUSH
10.0000 mL | INTRAVENOUS | Status: DC | PRN
  Administered 2019-07-08: 10 mL
  Filled 2019-07-08: qty 10

## 2019-07-08 MED ORDER — SODIUM CHLORIDE 0.9 % IV SOLN
2400.0000 mg/m2 | INTRAVENOUS | Status: DC
  Administered 2019-07-08: 4500 mg via INTRAVENOUS
  Filled 2019-07-08: qty 90

## 2019-07-08 MED ORDER — OXALIPLATIN CHEMO INJECTION 100 MG/20ML
65.0000 mg/m2 | Freq: Once | INTRAVENOUS | Status: AC
  Administered 2019-07-08: 11:00:00 120 mg via INTRAVENOUS
  Filled 2019-07-08: qty 20

## 2019-07-08 MED ORDER — PALONOSETRON HCL INJECTION 0.25 MG/5ML
INTRAVENOUS | Status: AC
  Filled 2019-07-08: qty 5

## 2019-07-08 NOTE — Progress Notes (Addendum)
Oscar Mccarty   [phone] [phone]   Clinic Follow up Note   Patient Care Team: Enid Skeens., MD as PCP - General (Family Medicine) Ladell Pier, MD as Consulting Physician (Oncology) Jackquline Denmark, MD as Consulting Physician (Gastroenterology) Michael Boston, MD as Consulting Physician (General Surgery) 07/08/2019  CHIEF COMPLAINT: F/u colon cancer   CURRENT THERAPY: Adjuvant FOLFOX   INTERVAL HISTORY: Oscar Mccarty returns for f/u and treatment as scheduled. He completed cycle 6 on 06/24/19. He has low appetite, fatigue, mild constipation and nausea on days 3-4 then recovers well. Uses miralax PRN which helps. Takes prilosec for a few days when he has heartburn then resolves. Cold sensitivity lasts 1 week, no other neuropathy. Denies fever, chills, cough, chest pain, dyspnea, or leg swelling. He has some back aches that he thinks is related to sitting/resting for long time. When it's cold he ambulates in the house. Oscar Mccarty makes him feel a little weak but otherwise tolerates it well. BG 98-104 when he checks it at home.    MEDICAL HISTORY:  Past Medical History:  Diagnosis Date  . Acute respiratory disease due to COVID-19 virus - June 2020 01/05/2019  . Adenocarcinoma of sigmoid colon ([place]) 12/22/2018   Malignant partially obstructing tumor in the proximal sigmoid colon  . Allergic rhinitis   . Chronic headaches   . Depression   . Diabetes mellitus, type 2 ([place]) 12/25/2018   new diagnosis, A1C 9.2% 12/25/2018  . Displaced fracture of distal phalanx of left middle finger 04/03/2016  . GERD (gastroesophageal reflux disease)   . History of COVID-19 respiratory pneumonitis June 2020 03/11/2019  . 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 COLONOSCOPY    . no surgical history    . PORTACATH PLACEMENT N/A 04/09/2019   Procedure:  PLACEMENT OF PORT-A-CATHETER UNDER ULTRASOUND AND FLUOROSCOPIC GUIDANCE;  Surgeon: Michael Boston, MD;  Location: [facility];  Service: General;  Laterality: N/A;  . PROCTOSCOPY N/A 03/11/2019   Procedure: RIGID PROCTOSCOPY;  Surgeon: Michael Boston, MD;  Location: [facility];  Service: General;  Laterality: N/A;  . XI ROBOTIC ASSISTED COLOSTOMY TAKEDOWN N/A 03/11/2019   Procedure: XI ROBOTIC ASSISTED LEFT COLECTOMY, MOBILIZATION OF SPLENIC FLEXURE, BILATERAL TAP BLOCK;  Surgeon: Michael Boston, MD;  Location: [facility];  Service: General;  Laterality: N/A;    I have reviewed the social history and family history with the patient and they are unchanged from previous note.  ALLERGIES:  has No Known Allergies.  MEDICATIONS:  Current Outpatient Medications  Medication Sig Dispense Refill  . atorvastatin (LIPITOR) 10 MG tablet Take 10 mg by mouth daily.    Marland Kitchen ibuprofen (ADVIL) 200 MG tablet Take 400 mg by mouth every 6 (six) hours as needed for headache or moderate pain.    Marland Kitchen lidocaine-prilocaine (EMLA) cream Apply 1 application topically as needed. 30 g 0  . metFORMIN (GLUCOPHAGE) 500 MG tablet Take 1 tablet (500 mg total) by mouth 2 (two) times daily with a meal. 60 tablet 11  . omeprazole (PRILOSEC OTC) 20 MG tablet Take 20 mg by mouth daily. Take for 4 days after each chemotherapy    . prochlorperazine (COMPAZINE) 10 MG tablet Take 1 tablet (10 mg total) by mouth every 6 (six) hours as needed for nausea or vomiting. 30 tablet 0  . traMADol (ULTRAM) 50 MG tablet Take 1-2 tablets (50-100 mg total)  by mouth every 6 (six) hours as needed for moderate pain or severe pain. 20 tablet 0  . lisinopril (ZESTRIL) 2.5 MG tablet Take 2.5 mg by mouth daily.     No current facility-administered medications for this visit.   Facility-Administered Medications Ordered in Other Visits  Medication Dose Route Frequency Provider Last Rate Last Admin  . dexamethasone (DECADRON) injection 10 mg  10 mg Intravenous Once Ladell Pier, MD      . fluorouracil (ADRUCIL) 4,500 mg in sodium chloride 0.9 % 60 mL chemo infusion  2,400 mg/m2 (Treatment Plan Recorded) Intravenous 1 day or 1 dose Ladell Pier, MD      . fluorouracil (ADRUCIL) chemo injection 750 mg  400 mg/m2 (Treatment Plan Recorded) Intravenous Once Ladell Pier, MD      . leucovorin 748 mg in dextrose 5 % 250 mL infusion  400 mg/m2 (Treatment Plan Recorded) Intravenous Once Ladell Pier, MD      . oxaliplatin (ELOXATIN) 120 mg in dextrose 5 % 500 mL chemo infusion  65 mg/m2 (Treatment Plan Recorded) Intravenous Once Ladell Pier, MD      . palonosetron (ALOXI) injection 0.25 mg  0.25 mg Intravenous Once Betsy Coder B, MD      . sodium chloride flush (NS) 0.9 % injection 10 mL  10 mL Intracatheter PRN Ladell Pier, MD        PHYSICAL EXAMINATION: ECOG PERFORMANCE STATUS: 1 - Symptomatic but completely ambulatory  Vitals:   07/08/19 0911  BP: 110/75  Pulse: 77  Resp: 18  Temp: 97.9 F (36.6 C)  SpO2: 97%   Filed Weights   07/08/19 0911  Weight: 162 lb 3.2 oz (73.6 kg)    GENERAL:alert, no distress and comfortable SKIN: palms with mild hyperpigmentation  EYES:  sclera clear LUNGS: clear with normal breathing effort HEART: regular rate & rhythm, no lower extremity edema ABDOMEN:abdomen soft, non-tender and normal bowel sounds NEURO: alert & oriented x 3 with fluent speech, no focal motor/sensory deficits PAC without erythema   LABORATORY DATA:  I have reviewed the data as listed CBC Latest Ref Rng & Units 07/08/2019 06/24/2019 06/10/2019  WBC 4.0 - 10.5 K/uL 8.4 8.4 10.9(H)  Hemoglobin 13.0 - 17.0 g/dL 13.9 13.4 13.5  Hematocrit 39.0 - 52.0 % 43.4 41.8 42.3  Platelets 150 - 400 K/uL 104(L) 115(L) 124(L)     CMP Latest Ref Rng & Units 07/08/2019 06/24/2019 06/10/2019  Glucose 70 - 99 mg/dL 197(H) 204(H) 225(H)  BUN 6 - 20 mg/dL _0 Creatinine 0.61 - 1.24 mg/dL 0.87 1.03 0.89  Sodium 135 - 145 mmol/L 138 137  138  Potassium 3.5 - 5.1 mmol/L 4.4 4.2 4.2  Chloride 98 - 111 mmol/L 104 104 104  CO2 22 - 32 mmol/L _1 Calcium 8.9 - 10.3 mg/dL 9.1 8.6(L) 8.6(L)  Total Protein 6.5 - 8.1 g/dL 7.2 6.9 7.1  Total Bilirubin 0.3 - 1.2 mg/dL 0.4 0.4 0.3  Alkaline Phos 38 - 126 U/L 192(H) 178(H) 176(H)  AST 15 - 41 U/L 40 27 24  ALT 0 - 44 U/L 62(H) 42 37      RADIOGRAPHIC STUDIES: I have personally reviewed the radiological images as listed and agreed with the findings in the report. No results found.   ASSESSMENT & PLAN:  1. 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 Microsatellite stable, no loss of mismatch repair protein expression ? CTs  12/24/2018-sigmoid: Wall thickening, multiple enlarged nodes adjacent to the sigmoid colon, no evidence of metastatic disease ? Cycle 1 FOLFOX 04/14/2019 ? Cycle 2 FOLFOX 04/30/2019, Udenyca added ? Cycle 3 FOLFOX 05/14/2019 (oxaliplatin dose reduced to 65 mg/m due to thrombocytopenia) ? Cycle 4 FOLFOX 05/27/2019 (continue oxaliplatin dose reduction to 61m/m2, PLT 129K) ? Cycle 5 FOLFOX 06/10/2019 ? Cycle 6 FOLFOX 06/24/2019 ? Cycle 7 FOLFOX 07/08/2019  2. COVID-19 respiratory infection June 2020 3. Diabetes diagnosed June 2020 4. Port-A-Cath placement, Dr. GJohney Maine 04/09/2019 5. Heartburn following chemotherapy, prilosec added 06/10/19 6. Periodic dizziness on standing, no significant orthostatic hypotension. Hold lisinopril as of 06/24/19   Disposition:  Mr. DCulottaappears well. He completed 6 cycles of adjuvant FOLFOX with dose-reduced Oxaliplatin. He continues to tolerate treatment well overall with mild fatigue, low appetite, and cold sensitivity lasting 1 week. He recovers well. GI side effects are well managed. Labs reviewed. PLT 104K. LFTs are mildly elevated but stable, likely related to Oxaliplatin. Adequate for treatment. He will proceed with cycle 7 FOLFOX today without further dose adjustments,  and Udenyca with pump d/c. He will return for f/u and next cycle on 07/22/19. The patient was seen with Dr. SBenay Spicetoday.   No problem-specific Assessment & Plan notes found for this encounter.   Orders Placed This Encounter  Procedures  . CBC with Differential (Cancer Center Only)    Standing Status:   Future    Standing Expiration Date:   07/07/2020  . CMP ([facility])    Standing Status:   Future    Standing Expiration Date:   07/07/2020   All questions were answered. The patient knows to call the clinic with any problems, questions or concerns. No barriers to learning was detected.     LAlla Feeling NP 07/08/19  This was a shared visit with LCira Rue  Mr. DThorappears stable.  He is tolerating the chemotherapy well.  He has mild thrombocytopenia.  The plan is to complete cycle 7 of adjuvant chemotherapy today.  BJulieanne Manson MD

## 2019-07-08 NOTE — Patient Instructions (Signed)
[facility] Discharge Instructions for Patients Receiving Chemotherapy  Today you received the following chemotherapy agents: Oxaliplatin, Leucovorin, and 5FU.  To help prevent nausea and vomiting after your treatment, we encourage you to take your nausea medication as directed.   If you develop nausea and vomiting that is not controlled by your nausea medication, call the clinic.   BELOW ARE SYMPTOMS THAT SHOULD BE REPORTED IMMEDIATELY:  *FEVER GREATER THAN 100.5 F  *CHILLS WITH OR WITHOUT FEVER  NAUSEA AND VOMITING THAT IS NOT CONTROLLED WITH YOUR NAUSEA MEDICATION  *UNUSUAL SHORTNESS OF BREATH  *UNUSUAL BRUISING OR BLEEDING  TENDERNESS IN MOUTH AND THROAT WITH OR WITHOUT PRESENCE OF ULCERS  *URINARY PROBLEMS  *BOWEL PROBLEMS  UNUSUAL RASH Items with * indicate a potential emergency and should be followed up as soon as possible.  Feel free to call the clinic should you have any questions or concerns. The clinic phone number is [phone].  Please show the CHEMO ALERT CARD at check-in to the Emergency Department and triage nurse.    

## 2019-07-08 NOTE — Progress Notes (Signed)
RN requested diabetic education for patient receiving chemotherapy for colon cancer. Noted patient has type 2 diabetes diagnosed in June. He is on Glucophage. Glucose was noted today to be 197.  Patient reports he has a limited eliminated all sweetened beverages such as soft drinks and sweet tea.  He very rarely eats sweets.  His diet mainly consists of meats and vegetables.  He does enjoy some fresh fruit.  He will occasionally consume a sweet dessert.  Provided brief education on no concentrated sweets diet.  Provided fact sheets for patient to take with him.  Questions were answered.  Teach back method used.  Contact information given.  No follow-up is scheduled patient has my contact information.  **Disclaimer: This note was dictated with voice recognition software. Similar sounding words can inadvertently be transcribed and this note may contain transcription errors which may not have been corrected upon publication of note.**

## 2019-07-08 NOTE — Patient Instructions (Signed)

## 2019-07-09 ENCOUNTER — Telehealth: Payer: Self-pay | Admitting: Nurse Practitioner

## 2019-07-09 NOTE — Telephone Encounter (Signed)
Scheduled appt per 12/16 los.  Patient will get a print out at the next scheduled appt.

## 2019-07-10 ENCOUNTER — Other Ambulatory Visit: Payer: Self-pay

## 2019-07-10 ENCOUNTER — Inpatient Hospital Stay: Payer: BC Managed Care – PPO

## 2019-07-10 VITALS — BP 110/76 | HR 78 | Temp 98.2°F | Resp 18

## 2019-07-10 DIAGNOSIS — C187 Malignant neoplasm of sigmoid colon: Secondary | ICD-10-CM

## 2019-07-10 MED ORDER — PEGFILGRASTIM-CBQV 6 MG/0.6ML ~~LOC~~ SOSY
PREFILLED_SYRINGE | SUBCUTANEOUS | Status: AC
  Filled 2019-07-10: qty 0.6

## 2019-07-10 MED ORDER — PEGFILGRASTIM-CBQV 6 MG/0.6ML ~~LOC~~ SOSY
6.0000 mg | PREFILLED_SYRINGE | Freq: Once | SUBCUTANEOUS | Status: AC
  Administered 2019-07-10: 6 mg via SUBCUTANEOUS

## 2019-07-10 MED ORDER — SODIUM CHLORIDE 0.9% FLUSH
10.0000 mL | INTRAVENOUS | Status: DC | PRN
  Administered 2019-07-10: 10 mL
  Filled 2019-07-10: qty 10

## 2019-07-10 MED ORDER — HEPARIN SOD (PORK) LOCK FLUSH 100 UNIT/ML IV SOLN
500.0000 [IU] | Freq: Once | INTRAVENOUS | Status: AC | PRN
  Administered 2019-07-10: 11:00:00 500 [IU]
  Filled 2019-07-10: qty 5

## 2019-07-19 ENCOUNTER — Other Ambulatory Visit: Payer: Self-pay | Admitting: Oncology

## 2019-07-22 ENCOUNTER — Inpatient Hospital Stay: Payer: BC Managed Care – PPO

## 2019-07-22 ENCOUNTER — Encounter: Payer: Self-pay | Admitting: Nurse Practitioner

## 2019-07-22 ENCOUNTER — Inpatient Hospital Stay (HOSPITAL_BASED_OUTPATIENT_CLINIC_OR_DEPARTMENT_OTHER): Payer: BC Managed Care – PPO | Admitting: Nurse Practitioner

## 2019-07-22 VITALS — BP 115/76 | HR 74 | Temp 98.0°F | Resp 18 | Ht 67.0 in | Wt 164.5 lb

## 2019-07-22 DIAGNOSIS — Z95828 Presence of other vascular implants and grafts: Secondary | ICD-10-CM

## 2019-07-22 DIAGNOSIS — C187 Malignant neoplasm of sigmoid colon: Secondary | ICD-10-CM | POA: Diagnosis not present

## 2019-07-22 LAB — CBC WITH DIFFERENTIAL (CANCER CENTER ONLY)
Abs Immature Granulocytes: 0.07 10*3/uL (ref 0.00–0.07)
Basophils Absolute: 0 10*3/uL (ref 0.0–0.1)
Basophils Relative: 0 %
Eosinophils Absolute: 0.1 10*3/uL (ref 0.0–0.5)
Eosinophils Relative: 1 %
HCT: 39.6 % (ref 39.0–52.0)
Hemoglobin: 12.7 g/dL — ABNORMAL LOW (ref 13.0–17.0)
Immature Granulocytes: 1 %
Lymphocytes Relative: 19 %
Lymphs Abs: 1.7 10*3/uL (ref 0.7–4.0)
MCH: 27 pg (ref 26.0–34.0)
MCHC: 32.1 g/dL (ref 30.0–36.0)
MCV: 84.3 fL (ref 80.0–100.0)
Monocytes Absolute: 0.9 10*3/uL (ref 0.1–1.0)
Monocytes Relative: 10 %
Neutro Abs: 6.2 10*3/uL (ref 1.7–7.7)
Neutrophils Relative %: 69 %
Platelet Count: 88 10*3/uL — ABNORMAL LOW (ref 150–400)
RBC: 4.7 MIL/uL (ref 4.22–5.81)
RDW: 21.5 % — ABNORMAL HIGH (ref 11.5–15.5)
WBC Count: 9 10*3/uL (ref 4.0–10.5)
nRBC: 0 % (ref 0.0–0.2)

## 2019-07-22 LAB — CMP (CANCER CENTER ONLY)
ALT: 43 U/L (ref 0–44)
AST: 29 U/L (ref 15–41)
Albumin: 3.8 g/dL (ref 3.5–5.0)
Alkaline Phosphatase: 174 U/L — ABNORMAL HIGH (ref 38–126)
Anion gap: 11 (ref 5–15)
BUN: 12 mg/dL (ref 6–20)
CO2: 24 mmol/L (ref 22–32)
Calcium: 8.5 mg/dL — ABNORMAL LOW (ref 8.9–10.3)
Chloride: 106 mmol/L (ref 98–111)
Creatinine: 0.75 mg/dL (ref 0.61–1.24)
GFR, Est AFR Am: >60 mL/min (ref >60)
GFR, Estimated: >60 mL/min (ref >60)
Glucose, Bld: 159 mg/dL — ABNORMAL HIGH (ref 70–99)
Potassium: 4.1 mmol/L (ref 3.5–5.1)
Sodium: 141 mmol/L (ref 135–145)
Total Bilirubin: 0.3 mg/dL (ref 0.3–1.2)
Total Protein: 6.8 g/dL (ref 6.5–8.1)

## 2019-07-22 MED ORDER — FLUOROURACIL CHEMO INJECTION 2.5 GM/50ML
400.0000 mg/m2 | Freq: Once | INTRAVENOUS | Status: AC
  Administered 2019-07-22: 750 mg via INTRAVENOUS
  Filled 2019-07-22: qty 15

## 2019-07-22 MED ORDER — PALONOSETRON HCL INJECTION 0.25 MG/5ML
INTRAVENOUS | Status: AC
  Filled 2019-07-22: qty 5

## 2019-07-22 MED ORDER — LIDOCAINE-PRILOCAINE 2.5-2.5 % EX CREA: 1.0000 "application " | TOPICAL_CREAM | CUTANEOUS | 0 refills | Status: DC | PRN

## 2019-07-22 MED ORDER — SODIUM CHLORIDE 0.9% FLUSH
10.0000 mL | Freq: Once | INTRAVENOUS | Status: AC
  Administered 2019-07-22: 10 mL
  Filled 2019-07-22: qty 10

## 2019-07-22 MED ORDER — SODIUM CHLORIDE 0.9% FLUSH
10.0000 mL | INTRAVENOUS | Status: DC | PRN
  Filled 2019-07-22: qty 10

## 2019-07-22 MED ORDER — SODIUM CHLORIDE 0.9 % IV SOLN
2400.0000 mg/m2 | INTRAVENOUS | Status: DC
  Administered 2019-07-22: 4500 mg via INTRAVENOUS
  Filled 2019-07-22: qty 90

## 2019-07-22 MED ORDER — HEPARIN SOD (PORK) LOCK FLUSH 100 UNIT/ML IV SOLN
500.0000 [IU] | Freq: Once | INTRAVENOUS | Status: DC | PRN
  Filled 2019-07-22: qty 5

## 2019-07-22 MED ORDER — DEXTROSE 5 % IV SOLN
Freq: Once | INTRAVENOUS | Status: AC
  Filled 2019-07-22: qty 250

## 2019-07-22 MED ORDER — DEXAMETHASONE SODIUM PHOSPHATE 10 MG/ML IJ SOLN
INTRAMUSCULAR | Status: AC
  Filled 2019-07-22: qty 1

## 2019-07-22 MED ORDER — OXALIPLATIN CHEMO INJECTION 100 MG/20ML
65.0000 mg/m2 | Freq: Once | INTRAVENOUS | Status: AC
  Administered 2019-07-22: 120 mg via INTRAVENOUS
  Filled 2019-07-22: qty 20

## 2019-07-22 MED ORDER — PALONOSETRON HCL INJECTION 0.25 MG/5ML
0.2500 mg | Freq: Once | INTRAVENOUS | Status: AC
  Administered 2019-07-22: 0.25 mg via INTRAVENOUS

## 2019-07-22 MED ORDER — DEXAMETHASONE SODIUM PHOSPHATE 10 MG/ML IJ SOLN
10.0000 mg | Freq: Once | INTRAMUSCULAR | Status: AC
  Administered 2019-07-22: 10 mg via INTRAVENOUS

## 2019-07-22 MED ORDER — LEUCOVORIN CALCIUM INJECTION 350 MG
400.0000 mg/m2 | Freq: Once | INTRAVENOUS | Status: AC
  Administered 2019-07-22: 748 mg via INTRAVENOUS
  Filled 2019-07-22: qty 37.4

## 2019-07-22 NOTE — Patient Instructions (Signed)
[facility] Discharge Instructions for Patients Receiving Chemotherapy  Today you received the following chemotherapy agents: Oxlaiplatin, Leucovorin, 5FU  To help prevent nausea and vomiting after your treatment, we encourage you to take your nausea medication as directed.   If you develop nausea and vomiting that is not controlled by your nausea medication, call the clinic.   BELOW ARE SYMPTOMS THAT SHOULD BE REPORTED IMMEDIATELY:  *FEVER GREATER THAN 100.5 F  *CHILLS WITH OR WITHOUT FEVER  NAUSEA AND VOMITING THAT IS NOT CONTROLLED WITH YOUR NAUSEA MEDICATION  *UNUSUAL SHORTNESS OF BREATH  *UNUSUAL BRUISING OR BLEEDING  TENDERNESS IN MOUTH AND THROAT WITH OR WITHOUT PRESENCE OF ULCERS  *URINARY PROBLEMS  *BOWEL PROBLEMS  UNUSUAL RASH Items with * indicate a potential emergency and should be followed up as soon as possible.  Feel free to call the clinic should you have any questions or concerns. The clinic phone number is [phone].  Please show the [place] at check-in to the Emergency Department and triage nurse.

## 2019-07-22 NOTE — Progress Notes (Signed)
  [place] OFFICE PROGRESS NOTE   Diagnosis: Colon cancer  INTERVAL HISTORY:   Oscar Mccarty returns as scheduled.  He completed cycle 7 FOLFOX 07/08/2019.  He had mild nausea on day 2.  No mouth sores.  No diarrhea.  Cold sensitivity lasted about 1 week.  No persistent neuropathy symptoms.  No hand or foot pain or redness.  No bleeding.  Objective:  Vital signs in last 24 hours:  Blood pressure 115/76, pulse 74, temperature 98 F (36.7 C), temperature source Temporal, resp. rate 18, height '5\' 7"'$  (1.702 m), weight 164 lb 8 oz (74.6 kg), SpO2 100 %.    HEENT: No thrush or ulcers. GI: Abdomen soft and nontender.  No hepatomegaly. Vascular: No leg edema. Neuro: Vibratory sense intact over the fingertips per tuning fork exam. Skin: Palms with hyperpigmentation, no erythema.  No skin breakdown. Port-A-Cath without erythema.   Lab Results:  Lab Results  Component Value Date   WBC 9.0 07/22/2019   HGB 12.7 (L) 07/22/2019   HCT 39.6 07/22/2019   MCV 84.3 07/22/2019   PLT 88 (L) 07/22/2019   NEUTROABS 6.2 07/22/2019    Imaging:  No results found.  Medications: I have reviewed the patient's current medications.  Assessment/Plan: 1. 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 Microsatellite stable, no loss of mismatch repair protein expression ? CTs 12/24/2018-sigmoid: Wall thickening, multiple enlarged nodes adjacent to the sigmoid colon, no evidence of metastatic disease ? Cycle 1 FOLFOX 04/14/2019 ? Cycle 2 FOLFOX 04/30/2019, Udenyca added ? Cycle 3 FOLFOX 05/14/2019 (oxaliplatin dose reduced to 65 mg/m due to thrombocytopenia) ? Cycle 4 FOLFOX 05/27/2019 (continue oxaliplatin dose reduction to '65mg'$ /m2, PLT 129K) ? Cycle 5 FOLFOX 06/10/2019 ? Cycle 6 FOLFOX 06/24/2019 ? Cycle 7 FOLFOX 07/08/2019  ? Cycle 8 FOLFOX 07/22/2019 2. COVID-19 respiratory infection June 2020 3. Diabetes diagnosed June  2020 4. Port-A-Cath placement, Dr. Johney Maine, 04/09/2019 5. Heartburn following chemotherapy, prilosec added 06/10/19 6. Periodic dizziness on standing, no significant orthostatic hypotension. Hold lisinopril as of 06/24/19    Disposition: Oscar Mccarty appears stable.  He has completed 7 cycles of adjuvant FOLFOX.  He continues to tolerate chemotherapy well.  Plan to proceed with cycle 8 today as scheduled.  We reviewed the CBC from today.  Counts are adequate to proceed with treatment.  He has mild thrombocytopenia.  He will contact the office with any bleeding.  He will return for lab, follow-up, cycle 9 FOLFOX in 2 weeks.  He will contact the office in the interim as outlined above or with any other problems.    Ned Card ANP/GNP-BC   07/22/2019  12:14 PM

## 2019-07-24 ENCOUNTER — Inpatient Hospital Stay: Payer: BC Managed Care – PPO | Attending: Oncology

## 2019-07-24 VITALS — BP 123/81 | HR 74 | Temp 98.9°F

## 2019-07-24 DIAGNOSIS — Z5189 Encounter for other specified aftercare: Secondary | ICD-10-CM | POA: Insufficient documentation

## 2019-07-24 DIAGNOSIS — Z5111 Encounter for antineoplastic chemotherapy: Secondary | ICD-10-CM | POA: Diagnosis present

## 2019-07-24 DIAGNOSIS — C187 Malignant neoplasm of sigmoid colon: Secondary | ICD-10-CM

## 2019-07-24 MED ORDER — PEGFILGRASTIM-CBQV 6 MG/0.6ML ~~LOC~~ SOSY
6.0000 mg | PREFILLED_SYRINGE | Freq: Once | SUBCUTANEOUS | Status: AC
  Administered 2019-07-24: 6 mg via SUBCUTANEOUS

## 2019-07-24 MED ORDER — SODIUM CHLORIDE 0.9% FLUSH
10.0000 mL | INTRAVENOUS | Status: DC | PRN
  Administered 2019-07-24: 14:00:00 10 mL
  Filled 2019-07-24: qty 10

## 2019-07-24 MED ORDER — HEPARIN SOD (PORK) LOCK FLUSH 100 UNIT/ML IV SOLN
500.0000 [IU] | Freq: Once | INTRAVENOUS | Status: AC | PRN
  Administered 2019-07-24: 500 [IU]
  Filled 2019-07-24: qty 5

## 2019-08-02 ENCOUNTER — Other Ambulatory Visit: Payer: Self-pay | Admitting: Oncology

## 2019-08-05 ENCOUNTER — Inpatient Hospital Stay: Payer: BC Managed Care – PPO

## 2019-08-05 ENCOUNTER — Other Ambulatory Visit: Payer: Self-pay

## 2019-08-05 ENCOUNTER — Inpatient Hospital Stay (HOSPITAL_BASED_OUTPATIENT_CLINIC_OR_DEPARTMENT_OTHER): Payer: BC Managed Care – PPO | Admitting: Oncology

## 2019-08-05 ENCOUNTER — Other Ambulatory Visit: Payer: Self-pay | Admitting: *Deleted

## 2019-08-05 VITALS — BP 123/76 | HR 75 | Temp 98.2°F | Resp 16 | Ht 67.0 in | Wt 164.2 lb

## 2019-08-05 DIAGNOSIS — C187 Malignant neoplasm of sigmoid colon: Secondary | ICD-10-CM | POA: Diagnosis not present

## 2019-08-05 DIAGNOSIS — Z95828 Presence of other vascular implants and grafts: Secondary | ICD-10-CM

## 2019-08-05 DIAGNOSIS — K625 Hemorrhage of anus and rectum: Secondary | ICD-10-CM

## 2019-08-05 DIAGNOSIS — C189 Malignant neoplasm of colon, unspecified: Secondary | ICD-10-CM

## 2019-08-05 LAB — CBC WITH DIFFERENTIAL (CANCER CENTER ONLY)
Abs Immature Granulocytes: 0.05 10*3/uL (ref 0.00–0.07)
Basophils Absolute: 0 10*3/uL (ref 0.0–0.1)
Basophils Relative: 0 %
Eosinophils Absolute: 0.1 10*3/uL (ref 0.0–0.5)
Eosinophils Relative: 1 %
HCT: 40.1 % (ref 39.0–52.0)
Hemoglobin: 13.3 g/dL (ref 13.0–17.0)
Immature Granulocytes: 1 %
Lymphocytes Relative: 18 %
Lymphs Abs: 1.5 10*3/uL (ref 0.7–4.0)
MCH: 28.5 pg (ref 26.0–34.0)
MCHC: 33.2 g/dL (ref 30.0–36.0)
MCV: 86.1 fL (ref 80.0–100.0)
Monocytes Absolute: 0.7 10*3/uL (ref 0.1–1.0)
Monocytes Relative: 9 %
Neutro Abs: 5.9 10*3/uL (ref 1.7–7.7)
Neutrophils Relative %: 71 %
Platelet Count: 86 10*3/uL — ABNORMAL LOW (ref 150–400)
RBC: 4.66 MIL/uL (ref 4.22–5.81)
RDW: 21.6 % — ABNORMAL HIGH (ref 11.5–15.5)
WBC Count: 8.3 10*3/uL (ref 4.0–10.5)
nRBC: 0 % (ref 0.0–0.2)

## 2019-08-05 LAB — CMP (CANCER CENTER ONLY)
ALT: 45 U/L — ABNORMAL HIGH (ref 0–44)
AST: 32 U/L (ref 15–41)
Albumin: 3.9 g/dL (ref 3.5–5.0)
Alkaline Phosphatase: 197 U/L — ABNORMAL HIGH (ref 38–126)
Anion gap: 10 (ref 5–15)
BUN: 11 mg/dL (ref 6–20)
CO2: 24 mmol/L (ref 22–32)
Calcium: 8.4 mg/dL — ABNORMAL LOW (ref 8.9–10.3)
Chloride: 105 mmol/L (ref 98–111)
Creatinine: 0.82 mg/dL (ref 0.61–1.24)
GFR, Est AFR Am: >60 mL/min (ref >60)
GFR, Estimated: >60 mL/min (ref >60)
Glucose, Bld: 174 mg/dL — ABNORMAL HIGH (ref 70–99)
Potassium: 4.2 mmol/L (ref 3.5–5.1)
Sodium: 139 mmol/L (ref 135–145)
Total Bilirubin: 0.5 mg/dL (ref 0.3–1.2)
Total Protein: 7 g/dL (ref 6.5–8.1)

## 2019-08-05 MED ORDER — OXALIPLATIN CHEMO INJECTION 100 MG/20ML
65.0000 mg/m2 | Freq: Once | INTRAVENOUS | Status: AC
  Administered 2019-08-05: 120 mg via INTRAVENOUS
  Filled 2019-08-05: qty 20

## 2019-08-05 MED ORDER — PALONOSETRON HCL INJECTION 0.25 MG/5ML
0.2500 mg | Freq: Once | INTRAVENOUS | Status: AC
  Administered 2019-08-05: 0.25 mg via INTRAVENOUS

## 2019-08-05 MED ORDER — LEUCOVORIN CALCIUM INJECTION 350 MG
400.0000 mg/m2 | Freq: Once | INTRAVENOUS | Status: AC
  Administered 2019-08-05: 748 mg via INTRAVENOUS
  Filled 2019-08-05: qty 37.4

## 2019-08-05 MED ORDER — DEXTROSE 5 % IV SOLN
Freq: Once | INTRAVENOUS | Status: AC
  Filled 2019-08-05: qty 250

## 2019-08-05 MED ORDER — FLUOROURACIL CHEMO INJECTION 2.5 GM/50ML
400.0000 mg/m2 | Freq: Once | INTRAVENOUS | Status: AC
  Administered 2019-08-05: 750 mg via INTRAVENOUS
  Filled 2019-08-05: qty 15

## 2019-08-05 MED ORDER — SODIUM CHLORIDE 0.9 % IV SOLN
2400.0000 mg/m2 | INTRAVENOUS | Status: DC
  Administered 2019-08-05: 4500 mg via INTRAVENOUS
  Filled 2019-08-05: qty 90

## 2019-08-05 MED ORDER — DEXAMETHASONE SODIUM PHOSPHATE 10 MG/ML IJ SOLN
10.0000 mg | Freq: Once | INTRAMUSCULAR | Status: AC
  Administered 2019-08-05: 10 mg via INTRAVENOUS

## 2019-08-05 MED ORDER — DEXAMETHASONE SODIUM PHOSPHATE 10 MG/ML IJ SOLN
INTRAMUSCULAR | Status: AC
  Filled 2019-08-05: qty 1

## 2019-08-05 MED ORDER — PALONOSETRON HCL INJECTION 0.25 MG/5ML
INTRAVENOUS | Status: AC
  Filled 2019-08-05: qty 5

## 2019-08-05 MED ORDER — SODIUM CHLORIDE 0.9% FLUSH
10.0000 mL | INTRAVENOUS | Status: DC | PRN
  Administered 2019-08-05: 10 mL
  Filled 2019-08-05: qty 10

## 2019-08-05 NOTE — Progress Notes (Signed)
  [place] OFFICE PROGRESS NOTE   Diagnosis: Colon cancer  INTERVAL HISTORY:   Oscar Mccarty completed another cycle of FOLFOX on 07/22/2019.  He had mild nausea following chemotherapy.  No bone pain with Neulasta.  Cold sensitivity lasted for 1 week and continues  when he touches something cold.  Chemotherapy.  No other neuropathy symptoms.  Objective:  Vital signs in last 24 hours:  Blood pressure 123/76, pulse 75, temperature 98.2 F (36.8 C), temperature source Temporal, resp. rate 16, height 5' 7" (1.702 m), weight 164 lb 3.2 oz (74.5 kg), SpO2 100 %.   Limited physical examination secondary to distancing with the Covid pandemic GI: No hepatosplenomegaly, nontender Vascular: No leg edema Neuro: The vibratory sense is intact at the fingertips bilaterally Skin: Hyperpigmentation of the hands  Portacath/PICC-without erythema  Lab Results:  Lab Results  Component Value Date   WBC 8.3 08/05/2019   HGB 13.3 08/05/2019   HCT 40.1 08/05/2019   MCV 86.1 08/05/2019   PLT 86 (L) 08/05/2019   NEUTROABS 5.9 08/05/2019    CMP  Lab Results  Component Value Date   NA 139 08/05/2019   K 4.2 08/05/2019   CL 105 08/05/2019   CO2 24 08/05/2019   GLUCOSE 174 (H) 08/05/2019   BUN 11 08/05/2019   CREATININE 0.82 08/05/2019   CALCIUM 8.4 (L) 08/05/2019   PROT 7.0 08/05/2019   ALBUMIN 3.9 08/05/2019   AST 32 08/05/2019   ALT 45 (H) 08/05/2019   ALKPHOS 197 (H) 08/05/2019   BILITOT 0.5 08/05/2019   GFRNONAA >60 08/05/2019   GFRAA >60 08/05/2019     Medications: I have reviewed the patient's current medications.   Assessment/Plan: 1. 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 Microsatellite stable, no loss of mismatch repair protein expression ? CTs 12/24/2018-sigmoid: Wall thickening, multiple enlarged nodes adjacent to the sigmoid colon, no evidence of metastatic disease ? Cycle 1 FOLFOX 04/14/2019 ? Cycle 2  FOLFOX 04/30/2019, Udenyca added ? Cycle 3 FOLFOX 05/14/2019 (oxaliplatin dose reduced to 65 mg/m due to thrombocytopenia) ? Cycle 4 FOLFOX 05/27/2019 (continue oxaliplatin dose reduction to 12m/m2, PLT 129K) ? Cycle 5 FOLFOX 06/10/2019 ? Cycle 6 FOLFOX 06/24/2019 ? Cycle 7 FOLFOX 07/08/2019  ? Cycle 8 FOLFOX 07/22/2019 ? Cycle 9 FOLFOX 08/05/2019 2. COVID-19 respiratory infection June 2020 3. Diabetes diagnosed June 2020 4. Port-A-Cath placement, Oscar Mccarty 04/09/2019 5. Heartburn following chemotherapy, prilosec added 06/10/19 6. Periodic dizziness on standing, no significant orthostatic hypotension. Hold lisinopril as of 06/24/19   Disposition: Mr. DKistlerappears stable.  He is tolerating the chemotherapy well.  He has prolonged cold sensitivity, but no other neuropathy symptoms.  The plan is to continue FOLFOX.  He will complete cycle 9 today.  Oxaliplatin will be eliminated from the chemotherapy regimen if he develops new neuropathy symptoms.  GBetsy Coder MD  08/05/2019  10:44 AM

## 2019-08-05 NOTE — Patient Instructions (Signed)
[facility] Discharge Instructions for Patients Receiving Chemotherapy  Today you received the following chemotherapy agents: Oxaliplatin, Leucovorin, and 5FU.  To help prevent nausea and vomiting after your treatment, we encourage you to take your nausea medication as directed.   If you develop nausea and vomiting that is not controlled by your nausea medication, call the clinic.   BELOW ARE SYMPTOMS THAT SHOULD BE REPORTED IMMEDIATELY:  *FEVER GREATER THAN 100.5 F  *CHILLS WITH OR WITHOUT FEVER  NAUSEA AND VOMITING THAT IS NOT CONTROLLED WITH YOUR NAUSEA MEDICATION  *UNUSUAL SHORTNESS OF BREATH  *UNUSUAL BRUISING OR BLEEDING  TENDERNESS IN MOUTH AND THROAT WITH OR WITHOUT PRESENCE OF ULCERS  *URINARY PROBLEMS  *BOWEL PROBLEMS  UNUSUAL RASH Items with * indicate a potential emergency and should be followed up as soon as possible.  Feel free to call the clinic should you have any questions or concerns. The clinic phone number is [phone].  Please show the CHEMO ALERT CARD at check-in to the Emergency Department and triage nurse.    

## 2019-08-05 NOTE — Progress Notes (Signed)
MD reviewed CBC/CMP today: OK to treat with platelets 86,000. No need to treat elevated glucose today.

## 2019-08-06 ENCOUNTER — Telehealth: Payer: Self-pay | Admitting: Oncology

## 2019-08-06 NOTE — Telephone Encounter (Signed)
Scheduled per los. Called and left msg. Mailed printout  °

## 2019-08-07 ENCOUNTER — Inpatient Hospital Stay: Payer: BC Managed Care – PPO

## 2019-08-07 ENCOUNTER — Other Ambulatory Visit: Payer: Self-pay

## 2019-08-07 VITALS — BP 128/72 | HR 72 | Temp 98.2°F | Resp 18

## 2019-08-07 DIAGNOSIS — C187 Malignant neoplasm of sigmoid colon: Secondary | ICD-10-CM | POA: Diagnosis not present

## 2019-08-07 MED ORDER — PEGFILGRASTIM-CBQV 6 MG/0.6ML ~~LOC~~ SOSY
PREFILLED_SYRINGE | SUBCUTANEOUS | Status: AC
  Filled 2019-08-07: qty 0.6

## 2019-08-07 MED ORDER — HEPARIN SOD (PORK) LOCK FLUSH 100 UNIT/ML IV SOLN
500.0000 [IU] | Freq: Once | INTRAVENOUS | Status: AC | PRN
  Administered 2019-08-07: 500 [IU]
  Filled 2019-08-07: qty 5

## 2019-08-07 MED ORDER — PEGFILGRASTIM-CBQV 6 MG/0.6ML ~~LOC~~ SOSY
6.0000 mg | PREFILLED_SYRINGE | Freq: Once | SUBCUTANEOUS | Status: AC
  Administered 2019-08-07: 6 mg via SUBCUTANEOUS

## 2019-08-07 MED ORDER — SODIUM CHLORIDE 0.9% FLUSH
10.0000 mL | INTRAVENOUS | Status: DC | PRN
  Administered 2019-08-07: 10 mL
  Filled 2019-08-07: qty 10

## 2019-08-07 NOTE — Patient Instructions (Signed)

## 2019-08-15 ENCOUNTER — Other Ambulatory Visit: Payer: Self-pay | Admitting: Oncology

## 2019-08-19 ENCOUNTER — Inpatient Hospital Stay: Payer: BC Managed Care – PPO

## 2019-08-19 ENCOUNTER — Other Ambulatory Visit: Payer: Self-pay

## 2019-08-19 ENCOUNTER — Other Ambulatory Visit: Payer: BC Managed Care – PPO

## 2019-08-19 ENCOUNTER — Ambulatory Visit: Payer: BC Managed Care – PPO | Admitting: Oncology

## 2019-08-19 ENCOUNTER — Ambulatory Visit: Payer: BC Managed Care – PPO

## 2019-08-19 ENCOUNTER — Inpatient Hospital Stay (HOSPITAL_BASED_OUTPATIENT_CLINIC_OR_DEPARTMENT_OTHER): Payer: BC Managed Care – PPO | Admitting: Oncology

## 2019-08-19 VITALS — BP 106/77 | HR 69 | Temp 97.5°F | Resp 17 | Ht 67.0 in | Wt 163.8 lb

## 2019-08-19 DIAGNOSIS — C187 Malignant neoplasm of sigmoid colon: Secondary | ICD-10-CM

## 2019-08-19 DIAGNOSIS — Z95828 Presence of other vascular implants and grafts: Secondary | ICD-10-CM

## 2019-08-19 LAB — CMP (CANCER CENTER ONLY)
ALT: 41 U/L (ref 0–44)
AST: 30 U/L (ref 15–41)
Albumin: 3.9 g/dL (ref 3.5–5.0)
Alkaline Phosphatase: 196 U/L — ABNORMAL HIGH (ref 38–126)
Anion gap: 8 (ref 5–15)
BUN: 11 mg/dL (ref 6–20)
CO2: 23 mmol/L (ref 22–32)
Calcium: 8.5 mg/dL — ABNORMAL LOW (ref 8.9–10.3)
Chloride: 107 mmol/L (ref 98–111)
Creatinine: 0.83 mg/dL (ref 0.61–1.24)
GFR, Est AFR Am: >60 mL/min (ref >60)
GFR, Estimated: >60 mL/min (ref >60)
Glucose, Bld: 217 mg/dL — ABNORMAL HIGH (ref 70–99)
Potassium: 4.1 mmol/L (ref 3.5–5.1)
Sodium: 138 mmol/L (ref 135–145)
Total Bilirubin: 0.3 mg/dL (ref 0.3–1.2)
Total Protein: 7 g/dL (ref 6.5–8.1)

## 2019-08-19 LAB — CBC WITH DIFFERENTIAL (CANCER CENTER ONLY)
Abs Immature Granulocytes: 0.06 10*3/uL (ref 0.00–0.07)
Basophils Absolute: 0.1 10*3/uL (ref 0.0–0.1)
Basophils Relative: 1 %
Eosinophils Absolute: 0.2 10*3/uL (ref 0.0–0.5)
Eosinophils Relative: 2 %
HCT: 39.8 % (ref 39.0–52.0)
Hemoglobin: 13.1 g/dL (ref 13.0–17.0)
Immature Granulocytes: 1 %
Lymphocytes Relative: 20 %
Lymphs Abs: 1.5 10*3/uL (ref 0.7–4.0)
MCH: 29.2 pg (ref 26.0–34.0)
MCHC: 32.9 g/dL (ref 30.0–36.0)
MCV: 88.6 fL (ref 80.0–100.0)
Monocytes Absolute: 0.6 10*3/uL (ref 0.1–1.0)
Monocytes Relative: 7 %
Neutro Abs: 5.1 10*3/uL (ref 1.7–7.7)
Neutrophils Relative %: 69 %
Platelet Count: 79 10*3/uL — ABNORMAL LOW (ref 150–400)
RBC: 4.49 MIL/uL (ref 4.22–5.81)
RDW: 19.1 % — ABNORMAL HIGH (ref 11.5–15.5)
WBC Count: 7.5 10*3/uL (ref 4.0–10.5)
nRBC: 0 % (ref 0.0–0.2)

## 2019-08-19 MED ORDER — PALONOSETRON HCL INJECTION 0.25 MG/5ML
0.2500 mg | Freq: Once | INTRAVENOUS | Status: AC
  Administered 2019-08-19: 0.25 mg via INTRAVENOUS

## 2019-08-19 MED ORDER — DEXTROSE 5 % IV SOLN
Freq: Once | INTRAVENOUS | Status: AC
  Filled 2019-08-19: qty 250

## 2019-08-19 MED ORDER — OXALIPLATIN CHEMO INJECTION 100 MG/20ML
65.0000 mg/m2 | Freq: Once | INTRAVENOUS | Status: AC
  Administered 2019-08-19: 120 mg via INTRAVENOUS
  Filled 2019-08-19: qty 24

## 2019-08-19 MED ORDER — PALONOSETRON HCL INJECTION 0.25 MG/5ML
INTRAVENOUS | Status: AC
  Filled 2019-08-19: qty 5

## 2019-08-19 MED ORDER — SODIUM CHLORIDE 0.9% FLUSH
10.0000 mL | INTRAVENOUS | Status: DC | PRN
  Administered 2019-08-19: 10 mL
  Filled 2019-08-19: qty 10

## 2019-08-19 MED ORDER — LEUCOVORIN CALCIUM INJECTION 350 MG
400.0000 mg/m2 | Freq: Once | INTRAVENOUS | Status: AC
  Administered 2019-08-19: 748 mg via INTRAVENOUS
  Filled 2019-08-19: qty 37.4

## 2019-08-19 MED ORDER — SODIUM CHLORIDE 0.9 % IV SOLN
2400.0000 mg/m2 | INTRAVENOUS | Status: DC
  Administered 2019-08-19: 4500 mg via INTRAVENOUS
  Filled 2019-08-19: qty 90

## 2019-08-19 MED ORDER — DEXAMETHASONE SODIUM PHOSPHATE 10 MG/ML IJ SOLN
INTRAMUSCULAR | Status: AC
  Filled 2019-08-19: qty 1

## 2019-08-19 MED ORDER — FLUOROURACIL CHEMO INJECTION 2.5 GM/50ML
400.0000 mg/m2 | Freq: Once | INTRAVENOUS | Status: AC
  Administered 2019-08-19: 750 mg via INTRAVENOUS
  Filled 2019-08-19: qty 15

## 2019-08-19 MED ORDER — DEXAMETHASONE SODIUM PHOSPHATE 10 MG/ML IJ SOLN
10.0000 mg | Freq: Once | INTRAMUSCULAR | Status: AC
  Administered 2019-08-19: 10 mg via INTRAVENOUS

## 2019-08-19 NOTE — Patient Instructions (Signed)
[facility] Discharge Instructions for Patients Receiving Chemotherapy  Today you received the following chemotherapy agents: oxaliplatin, leucovorin, and fluorouracil.  To help prevent nausea and vomiting after your treatment, we encourage you to take your nausea medication as directed.   If you develop nausea and vomiting that is not controlled by your nausea medication, call the clinic.   BELOW ARE SYMPTOMS THAT SHOULD BE REPORTED IMMEDIATELY:  *FEVER GREATER THAN 100.5 F  *CHILLS WITH OR WITHOUT FEVER  NAUSEA AND VOMITING THAT IS NOT CONTROLLED WITH YOUR NAUSEA MEDICATION  *UNUSUAL SHORTNESS OF BREATH  *UNUSUAL BRUISING OR BLEEDING  TENDERNESS IN MOUTH AND THROAT WITH OR WITHOUT PRESENCE OF ULCERS  *URINARY PROBLEMS  *BOWEL PROBLEMS  UNUSUAL RASH Items with * indicate a potential emergency and should be followed up as soon as possible.  Feel free to call the clinic should you have any questions or concerns. The clinic phone number is [phone].  Please show the CHEMO ALERT CARD at check-in to the Emergency Department and triage nurse.   

## 2019-08-19 NOTE — Progress Notes (Signed)
  [facility] OFFICE PROGRESS NOTE   Diagnosis: Colon cancer  INTERVAL HISTORY:   Oscar Mccarty completed another cycle of FOLFOX on 08/05/2019.  Nausea is relieved with Compazine.  No vomiting.  No mouth sores or diarrhea.  No hand or foot pain.  He has cold sensitivity for 1 week following chemotherapy.  This has resolved.  He notes an occasional "crawling" sensation over the face.  Tongue numbness.  He noted mild bleeding when he blew his nose.  No other bleeding.  Objective:  Vital signs in last 24 hours:  Blood pressure 106/77, pulse 69, temperature (!) 97.5 F (36.4 C), temperature source Temporal, resp. rate 17, height '5\' 7"'$  (1.702 m), weight 163 lb 12.8 oz (74.3 kg), SpO2 97 %.    Limited physical examination secondary to distancing with the Covid pandemic GI: No hepatosplenomegaly, nontender Vascular: No leg edema Neuro: The vibratory sense is intact in fingertips bilaterally Skin: Mild hyperpigmentation of the hands  Portacath/PICC-without erythema  Lab Results:  Lab Results  Component Value Date   WBC 7.5 08/19/2019   HGB 13.1 08/19/2019   HCT 39.8 08/19/2019   MCV 88.6 08/19/2019   PLT 79 (L) 08/19/2019   NEUTROABS 5.1 08/19/2019    CMP  Lab Results  Component Value Date   NA 138 08/19/2019   K 4.1 08/19/2019   CL 107 08/19/2019   CO2 23 08/19/2019   GLUCOSE 217 (H) 08/19/2019   BUN 11 08/19/2019   CREATININE 0.83 08/19/2019   CALCIUM 8.5 (L) 08/19/2019   PROT 7.0 08/19/2019   ALBUMIN 3.9 08/19/2019   AST 30 08/19/2019   ALT 41 08/19/2019   ALKPHOS 196 (H) 08/19/2019   BILITOT 0.3 08/19/2019   GFRNONAA >60 08/19/2019   GFRAA >60 08/19/2019     Medications: I have reviewed the patient's current medications.   Assessment/Plan: 1. 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 Microsatellite stable, no loss of mismatch repair protein expression ? CTs 12/24/2018-sigmoid: Wall thickening,  multiple enlarged nodes adjacent to the sigmoid colon, no evidence of metastatic disease ? Cycle 1 FOLFOX 04/14/2019 ? Cycle 2 FOLFOX 04/30/2019, Udenyca added ? Cycle 3 FOLFOX 05/14/2019 (oxaliplatin dose reduced to 65 mg/m due to thrombocytopenia) ? Cycle 4 FOLFOX 05/27/2019 (continue oxaliplatin dose reduction to '65mg'$ /m2, PLT 129K) ? Cycle 5 FOLFOX 06/10/2019 ? Cycle 6 FOLFOX 06/24/2019 ? Cycle 7 FOLFOX 07/08/2019  ? Cycle 8 FOLFOX 07/22/2019 ? Cycle 9 FOLFOX 08/05/2019 ? Cycle 10 FOLFOX 08/19/2019 2. COVID-19 respiratory infection June 2020 3. Diabetes diagnosed June 2020 4. Port-A-Cath placement, Dr. Johney Maine, 04/09/2019 5. Heartburn following chemotherapy, prilosec added 06/10/19 6. Periodic dizziness on standing, no significant orthostatic hypotension. Hold lisinopril as of 06/24/19     Disposition: Oscar Mccarty appears unchanged.  He has stable mild thrombocytopenia.  He has developed mild oxaliplatin neuropathy symptoms.  He will complete another treatment with FOLFOX today.  We will most likely discontinue oxaliplatin after this cycle.  He knows to call for bleeding or new symptoms.  Oscar Coder, MD  08/19/2019  9:47 AM

## 2019-08-19 NOTE — Progress Notes (Signed)
Per Dr. Sherrill: OK to treat w/platelet count 79,000 

## 2019-08-21 ENCOUNTER — Other Ambulatory Visit: Payer: Self-pay

## 2019-08-21 ENCOUNTER — Inpatient Hospital Stay: Payer: BC Managed Care – PPO

## 2019-08-21 VITALS — BP 128/68 | HR 78 | Temp 98.2°F | Resp 18

## 2019-08-21 DIAGNOSIS — C187 Malignant neoplasm of sigmoid colon: Secondary | ICD-10-CM

## 2019-08-21 MED ORDER — HEPARIN SOD (PORK) LOCK FLUSH 100 UNIT/ML IV SOLN
500.0000 [IU] | Freq: Once | INTRAVENOUS | Status: DC | PRN
  Filled 2019-08-21: qty 5

## 2019-08-21 MED ORDER — SODIUM CHLORIDE 0.9% FLUSH
10.0000 mL | INTRAVENOUS | Status: DC | PRN
  Filled 2019-08-21: qty 10

## 2019-08-21 MED ORDER — PEGFILGRASTIM-CBQV 6 MG/0.6ML ~~LOC~~ SOSY
PREFILLED_SYRINGE | SUBCUTANEOUS | Status: AC
  Filled 2019-08-21: qty 0.6

## 2019-08-21 MED ORDER — PEGFILGRASTIM-CBQV 6 MG/0.6ML ~~LOC~~ SOSY
6.0000 mg | PREFILLED_SYRINGE | Freq: Once | SUBCUTANEOUS | Status: AC
  Administered 2019-08-21: 09:00:00 6 mg via SUBCUTANEOUS

## 2019-08-21 NOTE — Patient Instructions (Signed)

## 2019-08-30 ENCOUNTER — Other Ambulatory Visit: Payer: Self-pay | Admitting: Oncology

## 2019-09-02 ENCOUNTER — Inpatient Hospital Stay: Payer: BC Managed Care – PPO

## 2019-09-02 ENCOUNTER — Other Ambulatory Visit: Payer: Self-pay

## 2019-09-02 ENCOUNTER — Inpatient Hospital Stay: Payer: BC Managed Care – PPO | Attending: Oncology | Admitting: Oncology

## 2019-09-02 VITALS — BP 113/80 | HR 82 | Temp 97.8°F | Resp 17 | Ht 67.0 in | Wt 165.4 lb

## 2019-09-02 DIAGNOSIS — Z5189 Encounter for other specified aftercare: Secondary | ICD-10-CM | POA: Insufficient documentation

## 2019-09-02 DIAGNOSIS — C187 Malignant neoplasm of sigmoid colon: Secondary | ICD-10-CM | POA: Diagnosis present

## 2019-09-02 DIAGNOSIS — Z5111 Encounter for antineoplastic chemotherapy: Secondary | ICD-10-CM | POA: Insufficient documentation

## 2019-09-02 DIAGNOSIS — Z95828 Presence of other vascular implants and grafts: Secondary | ICD-10-CM

## 2019-09-02 LAB — CBC WITH DIFFERENTIAL (CANCER CENTER ONLY)
Abs Immature Granulocytes: 0.01 10*3/uL (ref 0.00–0.07)
Basophils Absolute: 0 10*3/uL (ref 0.0–0.1)
Basophils Relative: 1 %
Eosinophils Absolute: 0.1 10*3/uL (ref 0.0–0.5)
Eosinophils Relative: 1 %
HCT: 38.6 % — ABNORMAL LOW (ref 39.0–52.0)
Hemoglobin: 12.5 g/dL — ABNORMAL LOW (ref 13.0–17.0)
Immature Granulocytes: 0 %
Lymphocytes Relative: 18 %
Lymphs Abs: 1.1 10*3/uL (ref 0.7–4.0)
MCH: 29.2 pg (ref 26.0–34.0)
MCHC: 32.4 g/dL (ref 30.0–36.0)
MCV: 90.2 fL (ref 80.0–100.0)
Monocytes Absolute: 0.6 10*3/uL (ref 0.1–1.0)
Monocytes Relative: 10 %
Neutro Abs: 4.2 10*3/uL (ref 1.7–7.7)
Neutrophils Relative %: 70 %
Platelet Count: 83 10*3/uL — ABNORMAL LOW (ref 150–400)
RBC: 4.28 MIL/uL (ref 4.22–5.81)
RDW: 17.2 % — ABNORMAL HIGH (ref 11.5–15.5)
WBC Count: 6 10*3/uL (ref 4.0–10.5)
nRBC: 0 % (ref 0.0–0.2)

## 2019-09-02 LAB — CMP (CANCER CENTER ONLY)
ALT: 39 U/L (ref 0–44)
AST: 31 U/L (ref 15–41)
Albumin: 3.8 g/dL (ref 3.5–5.0)
Alkaline Phosphatase: 175 U/L — ABNORMAL HIGH (ref 38–126)
Anion gap: 8 (ref 5–15)
BUN: 9 mg/dL (ref 6–20)
CO2: 26 mmol/L (ref 22–32)
Calcium: 8.6 mg/dL — ABNORMAL LOW (ref 8.9–10.3)
Chloride: 106 mmol/L (ref 98–111)
Creatinine: 0.78 mg/dL (ref 0.61–1.24)
GFR, Est AFR Am: >60 mL/min (ref >60)
GFR, Estimated: >60 mL/min (ref >60)
Glucose, Bld: 162 mg/dL — ABNORMAL HIGH (ref 70–99)
Potassium: 4 mmol/L (ref 3.5–5.1)
Sodium: 140 mmol/L (ref 135–145)
Total Bilirubin: 0.3 mg/dL (ref 0.3–1.2)
Total Protein: 6.8 g/dL (ref 6.5–8.1)

## 2019-09-02 MED ORDER — SODIUM CHLORIDE 0.9 % IV SOLN
2400.0000 mg/m2 | INTRAVENOUS | Status: DC
  Administered 2019-09-02: 4500 mg via INTRAVENOUS
  Filled 2019-09-02: qty 90

## 2019-09-02 MED ORDER — DEXTROSE 5 % IV SOLN
Freq: Once | INTRAVENOUS | Status: AC
  Filled 2019-09-02: qty 250

## 2019-09-02 MED ORDER — LEUCOVORIN CALCIUM INJECTION 350 MG
400.0000 mg/m2 | Freq: Once | INTRAVENOUS | Status: AC
  Administered 2019-09-02: 748 mg via INTRAVENOUS
  Filled 2019-09-02: qty 37.4

## 2019-09-02 MED ORDER — PALONOSETRON HCL INJECTION 0.25 MG/5ML
0.2500 mg | Freq: Once | INTRAVENOUS | Status: AC
  Administered 2019-09-02: 0.25 mg via INTRAVENOUS

## 2019-09-02 MED ORDER — OXALIPLATIN CHEMO INJECTION 100 MG/20ML
65.0000 mg/m2 | Freq: Once | INTRAVENOUS | Status: AC
  Administered 2019-09-02: 120 mg via INTRAVENOUS
  Filled 2019-09-02: qty 24

## 2019-09-02 MED ORDER — PALONOSETRON HCL INJECTION 0.25 MG/5ML
INTRAVENOUS | Status: AC
  Filled 2019-09-02: qty 5

## 2019-09-02 MED ORDER — SODIUM CHLORIDE 0.9% FLUSH
10.0000 mL | INTRAVENOUS | Status: DC | PRN
  Administered 2019-09-02: 11:00:00 10 mL
  Filled 2019-09-02: qty 10

## 2019-09-02 MED ORDER — DEXAMETHASONE SODIUM PHOSPHATE 10 MG/ML IJ SOLN
INTRAMUSCULAR | Status: AC
  Filled 2019-09-02: qty 1

## 2019-09-02 MED ORDER — FLUOROURACIL CHEMO INJECTION 2.5 GM/50ML
400.0000 mg/m2 | Freq: Once | INTRAVENOUS | Status: AC
  Administered 2019-09-02: 750 mg via INTRAVENOUS
  Filled 2019-09-02: qty 15

## 2019-09-02 MED ORDER — DEXAMETHASONE SODIUM PHOSPHATE 10 MG/ML IJ SOLN
10.0000 mg | Freq: Once | INTRAMUSCULAR | Status: AC
  Administered 2019-09-02: 10 mg via INTRAVENOUS

## 2019-09-02 NOTE — Progress Notes (Signed)
  [facility] OFFICE PROGRESS NOTE   Diagnosis: Colon cancer  INTERVAL HISTORY:   Oscar Mccarty completed FOLFOX on 08/19/2019.  He noted a white coating on the tongue on day 3.  This has resolved.  No nausea or diarrhea.  Cold sensitivity lasted for 1 week following chemotherapy.  No neuropathy symptoms at present.  He has intermittent discomfort at the right posterior lateral chest.  No dyspnea or cough.   Objective:  Vital signs in last 24 hours:  Blood pressure 113/80, pulse 82, temperature 97.8 F (36.6 C), temperature source Temporal, resp. rate 17, height '5\' 7"'$  (1.702 m), weight 165 lb 6.4 oz (75 kg), SpO2 100 %.    HEENT: No thrush or ulcers Resp: Lungs clear bilaterally Cardio: Regular rate and rhythm GI: No hepatosplenomegaly, no mass, nontender Vascular: No leg edema Neuro: The vibratory sense is intact at the fingertips bilaterally  Portacath/PICC-without erythema  Lab Results:  Lab Results  Component Value Date   WBC 6.0 09/02/2019   HGB 12.5 (L) 09/02/2019   HCT 38.6 (L) 09/02/2019   MCV 90.2 09/02/2019   PLT 83 (L) 09/02/2019   NEUTROABS 4.2 09/02/2019    CMP  Lab Results  Component Value Date   NA 140 09/02/2019   K 4.0 09/02/2019   CL 106 09/02/2019   CO2 26 09/02/2019   GLUCOSE 162 (H) 09/02/2019   BUN 9 09/02/2019   CREATININE 0.78 09/02/2019   CALCIUM 8.6 (L) 09/02/2019   PROT 6.8 09/02/2019   ALBUMIN 3.8 09/02/2019   AST 31 09/02/2019   ALT 39 09/02/2019   ALKPHOS 175 (H) 09/02/2019   BILITOT 0.3 09/02/2019   GFRNONAA >60 09/02/2019   GFRAA >60 09/02/2019     Medications: I have reviewed the patient's current medications.   Assessment/Plan: 1. 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 Microsatellite stable, no loss of mismatch repair protein expression ? CTs 12/24/2018-sigmoid: Wall thickening, multiple enlarged nodes adjacent to the sigmoid colon, no evidence of  metastatic disease ? Cycle 1 FOLFOX 04/14/2019 ? Cycle 2 FOLFOX 04/30/2019, Udenyca added ? Cycle 3 FOLFOX 05/14/2019 (oxaliplatin dose reduced to 65 mg/m due to thrombocytopenia) ? Cycle 4 FOLFOX 05/27/2019 (continue oxaliplatin dose reduction to '65mg'$ /m2, PLT 129K) ? Cycle 5 FOLFOX 06/10/2019 ? Cycle 6 FOLFOX 06/24/2019 ? Cycle 7 FOLFOX 07/08/2019  ? Cycle 8 FOLFOX 07/22/2019 ? Cycle 9 FOLFOX 08/05/2019 ? Cycle 10 FOLFOX 08/19/2019 ? Cycle 11 FOLFOX 09/02/2019 2. COVID-19 respiratory infection June 2020 3. Diabetes diagnosed June 2020 4. Port-A-Cath placement, Dr. Johney Maine, 04/09/2019 5. Heartburn following chemotherapy, prilosec added 06/10/19 6. Periodic dizziness on standing, no significant orthostatic hypotension. Hold lisinopril as of 06/24/19      Disposition: Oscar Mccarty appears unchanged.  He continues to tolerate the FOLFOX well.  He does not have significant neuropathy symptoms.  He will complete cycle 11 today.  He will return for an office visit and chemotherapy in 2 weeks.  Betsy Coder, MD  09/02/2019  12:26 PM

## 2019-09-02 NOTE — Patient Instructions (Signed)
[facility] Discharge Instructions for Patients Receiving Chemotherapy  Today you received the following chemotherapy agents: oxaliplatin, leucovorin, and fluorouracil.  To help prevent nausea and vomiting after your treatment, we encourage you to take your nausea medication as directed.   If you develop nausea and vomiting that is not controlled by your nausea medication, call the clinic.   BELOW ARE SYMPTOMS THAT SHOULD BE REPORTED IMMEDIATELY:  *FEVER GREATER THAN 100.5 F  *CHILLS WITH OR WITHOUT FEVER  NAUSEA AND VOMITING THAT IS NOT CONTROLLED WITH YOUR NAUSEA MEDICATION  *UNUSUAL SHORTNESS OF BREATH  *UNUSUAL BRUISING OR BLEEDING  TENDERNESS IN MOUTH AND THROAT WITH OR WITHOUT PRESENCE OF ULCERS  *URINARY PROBLEMS  *BOWEL PROBLEMS  UNUSUAL RASH Items with * indicate a potential emergency and should be followed up as soon as possible.  Feel free to call the clinic should you have any questions or concerns. The clinic phone number is [phone].  Please show the CHEMO ALERT CARD at check-in to the Emergency Department and triage nurse.   

## 2019-09-02 NOTE — Progress Notes (Signed)
Per Dr. Benay Spice: OK to treat w/platelet count 83,000

## 2019-09-04 ENCOUNTER — Inpatient Hospital Stay: Payer: BC Managed Care – PPO

## 2019-09-04 ENCOUNTER — Other Ambulatory Visit: Payer: Self-pay

## 2019-09-04 VITALS — BP 118/72 | HR 78 | Temp 98.2°F | Resp 18

## 2019-09-04 DIAGNOSIS — C187 Malignant neoplasm of sigmoid colon: Secondary | ICD-10-CM | POA: Diagnosis not present

## 2019-09-04 MED ORDER — SODIUM CHLORIDE 0.9% FLUSH
10.0000 mL | INTRAVENOUS | Status: DC | PRN
  Administered 2019-09-04: 10 mL
  Filled 2019-09-04: qty 10

## 2019-09-04 MED ORDER — PEGFILGRASTIM-CBQV 6 MG/0.6ML ~~LOC~~ SOSY
PREFILLED_SYRINGE | SUBCUTANEOUS | Status: AC
  Filled 2019-09-04: qty 0.6

## 2019-09-04 MED ORDER — PEGFILGRASTIM-CBQV 6 MG/0.6ML ~~LOC~~ SOSY
6.0000 mg | PREFILLED_SYRINGE | Freq: Once | SUBCUTANEOUS | Status: AC
  Administered 2019-09-04: 14:00:00 6 mg via SUBCUTANEOUS

## 2019-09-04 MED ORDER — HEPARIN SOD (PORK) LOCK FLUSH 100 UNIT/ML IV SOLN
500.0000 [IU] | Freq: Once | INTRAVENOUS | Status: AC | PRN
  Administered 2019-09-04: 500 [IU]
  Filled 2019-09-04: qty 5

## 2019-09-04 NOTE — Patient Instructions (Signed)

## 2019-09-12 ENCOUNTER — Other Ambulatory Visit: Payer: Self-pay | Admitting: Oncology

## 2019-09-16 ENCOUNTER — Inpatient Hospital Stay: Payer: BC Managed Care – PPO

## 2019-09-16 ENCOUNTER — Inpatient Hospital Stay (HOSPITAL_BASED_OUTPATIENT_CLINIC_OR_DEPARTMENT_OTHER): Payer: BC Managed Care – PPO | Admitting: Oncology

## 2019-09-16 ENCOUNTER — Other Ambulatory Visit: Payer: Self-pay

## 2019-09-16 VITALS — BP 121/65 | HR 78 | Temp 98.2°F | Resp 18 | Ht 67.0 in | Wt 163.8 lb

## 2019-09-16 DIAGNOSIS — C187 Malignant neoplasm of sigmoid colon: Secondary | ICD-10-CM

## 2019-09-16 DIAGNOSIS — Z95828 Presence of other vascular implants and grafts: Secondary | ICD-10-CM

## 2019-09-16 LAB — CBC WITH DIFFERENTIAL (CANCER CENTER ONLY)
Abs Immature Granulocytes: 0.01 10*3/uL (ref 0.00–0.07)
Basophils Absolute: 0 10*3/uL (ref 0.0–0.1)
Basophils Relative: 1 %
Eosinophils Absolute: 0.1 10*3/uL (ref 0.0–0.5)
Eosinophils Relative: 2 %
HCT: 39.2 % (ref 39.0–52.0)
Hemoglobin: 12.7 g/dL — ABNORMAL LOW (ref 13.0–17.0)
Immature Granulocytes: 0 %
Lymphocytes Relative: 19 %
Lymphs Abs: 1.1 10*3/uL (ref 0.7–4.0)
MCH: 29.2 pg (ref 26.0–34.0)
MCHC: 32.4 g/dL (ref 30.0–36.0)
MCV: 90.1 fL (ref 80.0–100.0)
Monocytes Absolute: 0.6 10*3/uL (ref 0.1–1.0)
Monocytes Relative: 11 %
Neutro Abs: 4 10*3/uL (ref 1.7–7.7)
Neutrophils Relative %: 67 %
Platelet Count: 75 10*3/uL — ABNORMAL LOW (ref 150–400)
RBC: 4.35 MIL/uL (ref 4.22–5.81)
RDW: 16.2 % — ABNORMAL HIGH (ref 11.5–15.5)
WBC Count: 5.9 10*3/uL (ref 4.0–10.5)
nRBC: 0 % (ref 0.0–0.2)

## 2019-09-16 LAB — CMP (CANCER CENTER ONLY)
ALT: 37 U/L (ref 0–44)
AST: 31 U/L (ref 15–41)
Albumin: 3.9 g/dL (ref 3.5–5.0)
Alkaline Phosphatase: 184 U/L — ABNORMAL HIGH (ref 38–126)
Anion gap: 9 (ref 5–15)
BUN: 11 mg/dL (ref 6–20)
CO2: 25 mmol/L (ref 22–32)
Calcium: 8.8 mg/dL — ABNORMAL LOW (ref 8.9–10.3)
Chloride: 108 mmol/L (ref 98–111)
Creatinine: 0.77 mg/dL (ref 0.61–1.24)
GFR, Est AFR Am: >60 mL/min (ref >60)
GFR, Estimated: >60 mL/min (ref >60)
Glucose, Bld: 156 mg/dL — ABNORMAL HIGH (ref 70–99)
Potassium: 4.2 mmol/L (ref 3.5–5.1)
Sodium: 142 mmol/L (ref 135–145)
Total Bilirubin: 0.4 mg/dL (ref 0.3–1.2)
Total Protein: 7.1 g/dL (ref 6.5–8.1)

## 2019-09-16 LAB — CEA (IN HOUSE-CHCC): CEA (CHCC-In House): 2.49 ng/mL (ref 0.00–5.00)

## 2019-09-16 MED ORDER — SODIUM CHLORIDE 0.9% FLUSH
10.0000 mL | INTRAVENOUS | Status: DC | PRN
  Filled 2019-09-16: qty 10

## 2019-09-16 MED ORDER — PALONOSETRON HCL INJECTION 0.25 MG/5ML
0.2500 mg | Freq: Once | INTRAVENOUS | Status: AC
  Administered 2019-09-16: 0.25 mg via INTRAVENOUS

## 2019-09-16 MED ORDER — PALONOSETRON HCL INJECTION 0.25 MG/5ML
INTRAVENOUS | Status: AC
  Filled 2019-09-16: qty 5

## 2019-09-16 MED ORDER — SODIUM CHLORIDE 0.9 % IV SOLN
2400.0000 mg/m2 | INTRAVENOUS | Status: DC
  Administered 2019-09-16: 4500 mg via INTRAVENOUS
  Filled 2019-09-16: qty 90

## 2019-09-16 MED ORDER — DEXAMETHASONE SODIUM PHOSPHATE 10 MG/ML IJ SOLN
INTRAMUSCULAR | Status: AC
  Filled 2019-09-16: qty 1

## 2019-09-16 MED ORDER — HEPARIN SOD (PORK) LOCK FLUSH 100 UNIT/ML IV SOLN
500.0000 [IU] | Freq: Once | INTRAVENOUS | Status: DC | PRN
  Filled 2019-09-16: qty 5

## 2019-09-16 MED ORDER — SODIUM CHLORIDE 0.9% FLUSH
10.0000 mL | INTRAVENOUS | Status: DC | PRN
  Administered 2019-09-16: 10 mL
  Filled 2019-09-16: qty 10

## 2019-09-16 MED ORDER — DEXAMETHASONE SODIUM PHOSPHATE 10 MG/ML IJ SOLN
10.0000 mg | Freq: Once | INTRAMUSCULAR | Status: AC
  Administered 2019-09-16: 10 mg via INTRAVENOUS

## 2019-09-16 MED ORDER — DEXTROSE 5 % IV SOLN
Freq: Once | INTRAVENOUS | Status: AC
  Filled 2019-09-16: qty 250

## 2019-09-16 MED ORDER — LEUCOVORIN CALCIUM INJECTION 350 MG
400.0000 mg/m2 | Freq: Once | INTRAVENOUS | Status: AC
  Administered 2019-09-16: 748 mg via INTRAVENOUS
  Filled 2019-09-16: qty 37.4

## 2019-09-16 MED ORDER — OXALIPLATIN CHEMO INJECTION 100 MG/20ML
65.0000 mg/m2 | Freq: Once | INTRAVENOUS | Status: AC
  Administered 2019-09-16: 120 mg via INTRAVENOUS
  Filled 2019-09-16: qty 20

## 2019-09-16 MED ORDER — FLUOROURACIL CHEMO INJECTION 2.5 GM/50ML
400.0000 mg/m2 | Freq: Once | INTRAVENOUS | Status: AC
  Administered 2019-09-16: 750 mg via INTRAVENOUS
  Filled 2019-09-16: qty 15

## 2019-09-16 NOTE — Progress Notes (Signed)
  [place] OFFICE PROGRESS NOTE   Diagnosis: Colon cancer  INTERVAL HISTORY:   Oscar Mccarty completed another cycle of FOLFOX on 09/02/2019.  He reports mild nausea following chemotherapy relieved with Compazine.  Cold sensitivity lasts for 1 week following chemotherapy.  No other neuropathy symptoms.  He plans to return to work at the completion of chemotherapy.  No mouth sores or diarrhea.  Objective:  Vital signs in last 24 hours:  Blood pressure 121/65, pulse 78, temperature 98.2 F (36.8 C), temperature source Temporal, resp. rate 18, height '5\' 7"'$  (1.702 m), weight 163 lb 12.8 oz (74.3 kg), SpO2 100 %.    Lymphatics: No cervical, supraclavicular, axillary, or inguinal nodes Resp: Lungs clear bilaterally Cardio: Regular rate and rhythm GI: Nontender, no mass, no hepatosplenomegaly Vascular: No leg edema Neuro: The vibratory sense is intact at the fingertips bilaterally Skin: Mild hyperpigmentation of the hands  Portacath/PICC-without erythema  Lab Results:  Lab Results  Component Value Date   WBC 5.9 09/16/2019   HGB 12.7 (L) 09/16/2019   HCT 39.2 09/16/2019   MCV 90.1 09/16/2019   PLT 75 (L) 09/16/2019   NEUTROABS 4.0 09/16/2019    CMP  Lab Results  Component Value Date   NA 142 09/16/2019   K 4.2 09/16/2019   CL 108 09/16/2019   CO2 25 09/16/2019   GLUCOSE 156 (H) 09/16/2019   BUN 11 09/16/2019   CREATININE 0.77 09/16/2019   CALCIUM 8.8 (L) 09/16/2019   PROT 7.1 09/16/2019   ALBUMIN 3.9 09/16/2019   AST 31 09/16/2019   ALT 37 09/16/2019   ALKPHOS 184 (H) 09/16/2019   BILITOT 0.4 09/16/2019   GFRNONAA >60 09/16/2019   GFRAA >60 09/16/2019    Medications: I have reviewed the patient's current medications.   Assessment/Plan: 1. 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 Microsatellite stable, no loss of mismatch repair protein expression ? CTs 12/24/2018-sigmoid: Wall thickening,  multiple enlarged nodes adjacent to the sigmoid colon, no evidence of metastatic disease ? Cycle 1 FOLFOX 04/14/2019 ? Cycle 2 FOLFOX 04/30/2019, Udenyca added ? Cycle 3 FOLFOX 05/14/2019 (oxaliplatin dose reduced to 65 mg/m due to thrombocytopenia) ? Cycle 4 FOLFOX 05/27/2019 (continue oxaliplatin dose reduction to '65mg'$ /m2, PLT 129K) ? Cycle 5 FOLFOX 06/10/2019 ? Cycle 6 FOLFOX 06/24/2019 ? Cycle 7 FOLFOX 07/08/2019  ? Cycle 8 FOLFOX 07/22/2019 ? Cycle 9 FOLFOX 08/05/2019 ? Cycle 10 FOLFOX 08/19/2019 ? Cycle 11 FOLFOX 09/02/2019 ? Cycle 12 FOLFOX 09/16/2019 2. COVID-19 respiratory infection June 2020 3. Diabetes diagnosed June 2020 4. Port-A-Cath placement, Dr. Johney Maine, 04/09/2019 5. Heartburn following chemotherapy, prilosec added 06/10/19 6. Periodic dizziness on standing, no significant orthostatic hypotension. Hold lisinopril as of 06/24/19   Disposition: Oscar Mccarty appears unchanged.  He has completed 11 cycles of adjuvant FOLFOX.  He will complete the final cycle of chemotherapy today.  He does not have significant neuropathy symptoms.  The plan is to continue oxaliplatin.  He has moderate thrombocytopenia, but the platelet count has not changed significantly over the last several cycles of chemotherapy.  He will return for an office visit and CBC on 10/02/2019.  He will return to work on 10/05/2019, initially on a reduced schedule.  We will refer him to Dr. Johney Maine for Port-A-Cath removal if he is stable when we see him on 10/02/2019.  Oscar Coder, MD  09/16/2019  10:33 AM

## 2019-09-16 NOTE — Patient Instructions (Signed)
[facility] Discharge Instructions for Patients Receiving Chemotherapy  Today you received the following chemotherapy agents: oxaliplatin, leucovorin, and fluorouracil.  To help prevent nausea and vomiting after your treatment, we encourage you to take your nausea medication as directed.   If you develop nausea and vomiting that is not controlled by your nausea medication, call the clinic.   BELOW ARE SYMPTOMS THAT SHOULD BE REPORTED IMMEDIATELY:  *FEVER GREATER THAN 100.5 F  *CHILLS WITH OR WITHOUT FEVER  NAUSEA AND VOMITING THAT IS NOT CONTROLLED WITH YOUR NAUSEA MEDICATION  *UNUSUAL SHORTNESS OF BREATH  *UNUSUAL BRUISING OR BLEEDING  TENDERNESS IN MOUTH AND THROAT WITH OR WITHOUT PRESENCE OF ULCERS  *URINARY PROBLEMS  *BOWEL PROBLEMS  UNUSUAL RASH Items with * indicate a potential emergency and should be followed up as soon as possible.  Feel free to call the clinic should you have any questions or concerns. The clinic phone number is [phone].  Please show the CHEMO ALERT CARD at check-in to the Emergency Department and triage nurse.   

## 2019-09-16 NOTE — Progress Notes (Signed)
Per Dr. Benay Spice: OK to treat w/platelet count 75,000

## 2019-09-17 ENCOUNTER — Telehealth: Payer: Self-pay | Admitting: Oncology

## 2019-09-17 NOTE — Telephone Encounter (Signed)
Scheduled per los. Called and spoke with patient. Confirmed appt 

## 2019-09-18 ENCOUNTER — Other Ambulatory Visit: Payer: Self-pay

## 2019-09-18 ENCOUNTER — Inpatient Hospital Stay: Payer: BC Managed Care – PPO

## 2019-09-18 VITALS — BP 128/72 | HR 72 | Temp 98.2°F | Resp 18

## 2019-09-18 DIAGNOSIS — C187 Malignant neoplasm of sigmoid colon: Secondary | ICD-10-CM

## 2019-09-18 MED ORDER — HEPARIN SOD (PORK) LOCK FLUSH 100 UNIT/ML IV SOLN
500.0000 [IU] | Freq: Once | INTRAVENOUS | Status: AC | PRN
  Administered 2019-09-18: 12:00:00 500 [IU]
  Filled 2019-09-18: qty 5

## 2019-09-18 MED ORDER — SODIUM CHLORIDE 0.9% FLUSH
10.0000 mL | INTRAVENOUS | Status: DC | PRN
  Administered 2019-09-18: 12:00:00 10 mL
  Filled 2019-09-18: qty 10

## 2019-09-18 MED ORDER — PEGFILGRASTIM-CBQV 6 MG/0.6ML ~~LOC~~ SOSY
6.0000 mg | PREFILLED_SYRINGE | Freq: Once | SUBCUTANEOUS | Status: AC
  Administered 2019-09-18: 12:00:00 6 mg via SUBCUTANEOUS

## 2019-09-18 MED ORDER — PEGFILGRASTIM-CBQV 6 MG/0.6ML ~~LOC~~ SOSY
PREFILLED_SYRINGE | SUBCUTANEOUS | Status: AC
  Filled 2019-09-18: qty 0.6

## 2019-09-18 NOTE — Patient Instructions (Signed)

## 2019-10-02 ENCOUNTER — Telehealth: Payer: Self-pay | Admitting: Oncology

## 2019-10-02 ENCOUNTER — Other Ambulatory Visit: Payer: Self-pay

## 2019-10-02 ENCOUNTER — Encounter: Payer: Self-pay | Admitting: Nurse Practitioner

## 2019-10-02 ENCOUNTER — Inpatient Hospital Stay: Payer: BC Managed Care – PPO

## 2019-10-02 ENCOUNTER — Inpatient Hospital Stay: Payer: BC Managed Care – PPO | Attending: Oncology | Admitting: Nurse Practitioner

## 2019-10-02 VITALS — BP 126/76 | HR 71 | Temp 98.3°F | Resp 16 | Ht 67.0 in | Wt 162.1 lb

## 2019-10-02 DIAGNOSIS — D6959 Other secondary thrombocytopenia: Secondary | ICD-10-CM | POA: Diagnosis not present

## 2019-10-02 DIAGNOSIS — C187 Malignant neoplasm of sigmoid colon: Secondary | ICD-10-CM | POA: Diagnosis not present

## 2019-10-02 DIAGNOSIS — Z95828 Presence of other vascular implants and grafts: Secondary | ICD-10-CM

## 2019-10-02 DIAGNOSIS — T451X5A Adverse effect of antineoplastic and immunosuppressive drugs, initial encounter: Secondary | ICD-10-CM | POA: Diagnosis not present

## 2019-10-02 DIAGNOSIS — E119 Type 2 diabetes mellitus without complications: Secondary | ICD-10-CM | POA: Insufficient documentation

## 2019-10-02 LAB — CBC WITH DIFFERENTIAL (CANCER CENTER ONLY)
Abs Immature Granulocytes: 0.07 10*3/uL (ref 0.00–0.07)
Basophils Absolute: 0 10*3/uL (ref 0.0–0.1)
Basophils Relative: 0 %
Eosinophils Absolute: 0.1 10*3/uL (ref 0.0–0.5)
Eosinophils Relative: 1 %
HCT: 40 % (ref 39.0–52.0)
Hemoglobin: 12.9 g/dL — ABNORMAL LOW (ref 13.0–17.0)
Immature Granulocytes: 1 %
Lymphocytes Relative: 14 %
Lymphs Abs: 0.9 10*3/uL (ref 0.7–4.0)
MCH: 29.3 pg (ref 26.0–34.0)
MCHC: 32.3 g/dL (ref 30.0–36.0)
MCV: 90.9 fL (ref 80.0–100.0)
Monocytes Absolute: 0.5 10*3/uL (ref 0.1–1.0)
Monocytes Relative: 8 %
Neutro Abs: 4.7 10*3/uL (ref 1.7–7.7)
Neutrophils Relative %: 76 %
Platelet Count: 77 10*3/uL — ABNORMAL LOW (ref 150–400)
RBC: 4.4 MIL/uL (ref 4.22–5.81)
RDW: 15.7 % — ABNORMAL HIGH (ref 11.5–15.5)
WBC Count: 6.3 10*3/uL (ref 4.0–10.5)
nRBC: 0 % (ref 0.0–0.2)

## 2019-10-02 MED ORDER — FLUCONAZOLE 100 MG PO TABS: 100.0000 mg | ORAL_TABLET | Freq: Every day | ORAL | 0 refills | Status: DC

## 2019-10-02 MED ORDER — SODIUM CHLORIDE 0.9% FLUSH
10.0000 mL | INTRAVENOUS | Status: DC | PRN
  Administered 2019-10-02: 10 mL
  Filled 2019-10-02: qty 10

## 2019-10-02 MED ORDER — HEPARIN SOD (PORK) LOCK FLUSH 100 UNIT/ML IV SOLN
500.0000 [IU] | Freq: Once | INTRAVENOUS | Status: AC | PRN
  Administered 2019-10-02: 500 [IU]
  Filled 2019-10-02: qty 5

## 2019-10-02 NOTE — Progress Notes (Addendum)
[facility] OFFICE PROGRESS NOTE   Diagnosis: Colon cancer  INTERVAL HISTORY:   Oscar Mccarty returns as scheduled.  He completed cycle 12 FOLFOX on 09/16/2019.  Overall he is feeling well.  No nausea or vomiting.  No constipation or diarrhea.  He denies pain.  Fingertips feel numb.  This does not interfere with activity.  He notes abnormal taste as well as an abnormal sensation involving the tongue.  No bleeding.  Objective:  Vital signs in last 24 hours:  Blood pressure 126/76, pulse 71, temperature 98.3 F (36.8 C), temperature source Temporal, resp. rate 16, height _0  (1.702 m), weight 162 lb 1.6 oz (73.5 kg), SpO2 100 %.    HEENT: Thick white coating over tongue. Lymphatics: No palpable cervical, supraclavicular, axillary or inguinal lymph nodes. GI: Abdomen soft and nontender.  No hepatomegaly. Vascular: No leg edema. Neuro: Vibratory sense intact over the fingertips per tuning fork exam. Skin: Palms with hyperpigmentation.  No erythema or skin breakdown. Port-A-Cath without erythema.   Lab Results:  Lab Results  Component Value Date   WBC 6.3 10/02/2019   HGB 12.9 (L) 10/02/2019   HCT 40.0 10/02/2019   MCV 90.9 10/02/2019   PLT 77 (L) 10/02/2019   NEUTROABS 4.7 10/02/2019    Imaging:  No results found.  Medications: I have reviewed the patient's current medications.  Assessment/Plan: 1. 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 Microsatellite Mccarty, no loss of mismatch repair protein expression ? CTs 12/24/2018-sigmoid: Wall thickening, multiple enlarged nodes adjacent to the sigmoid colon, no evidence of metastatic disease ? Cycle 1 FOLFOX 04/14/2019 ? Cycle 2 FOLFOX 04/30/2019, Udenyca added ? Cycle 3 FOLFOX 05/14/2019 (oxaliplatin dose reduced to 65 mg/m due to thrombocytopenia) ? Cycle 4 FOLFOX 05/27/2019 (continue oxaliplatin dose reduction to 26m/m2, PLT 129K) ? Cycle 5 FOLFOX  06/10/2019 ? Cycle 6 FOLFOX 06/24/2019 ? Cycle 7 FOLFOX 07/08/2019 ? Cycle 8 FOLFOX 07/22/2019 ? Cycle 9 FOLFOX 08/05/2019 ? Cycle 10 FOLFOX 08/19/2019 ? Cycle 11 FOLFOX 09/02/2019 ? Cycle 12 FOLFOX 09/16/2019 2. COVID-19 respiratory infection June 2020 3. Diabetes diagnosed June 2020 4. Port-A-Cath placement, Dr. GJohney Maine 04/09/2019 5. Heartburn following chemotherapy, prilosec added 06/10/19 6. Periodic dizziness on standing, no significant orthostatic hypotension. Hold lisinopril as of 06/24/19   Disposition: Mr. DGomillionappears Mccarty.  He has completed the course of adjuvant chemotherapy.    We reviewed the CBC from today.  He has persistent thrombocytopenia secondary to chemotherapy.  He will contact the office with any bleeding.  He has developed numbness in the fingers, abnormal sensation over the tongue.  He understands this is most likely related to the oxaliplatin and will hopefully improve over the next several months.  In addition he appears to have an oral yeast infection.  He will complete a 4-day course of Diflucan.  He will hold Lipitor while taking Diflucan.  We made a referral to Dr. GJohney Mainefor Port-A-Cath removal.  Mr. DTrebilcockwill Mccarty for lab and CT scans in approximately 2 months.  We will see him in follow-up a few days later to review those results.  He will contact the office in the interim as outlined above or with any other problems.  Patient seen with Dr. SBenay Spice    LNed CardANP/GNP-BC   10/02/2019  10:24 AM This was a shared visit with LNed Card  [name] completed adjuvant therapy.  He has mild oxaliplatin neuropathy. He plans to Mccarty to work  on 10/05/2019.  We referred him to Dr. Johney Maine for Port-A-Cath removal.  Julieanne Manson, MD

## 2019-10-02 NOTE — Telephone Encounter (Signed)
Scheduled per 3/12 los. Gave avs, calendar, and contrast

## 2019-11-03 ENCOUNTER — Ambulatory Visit: Payer: Self-pay | Admitting: Surgery

## 2019-11-03 NOTE — H&P (Signed)
? Oscar Mccarty ? Documented: 11/03/2019 12:01 PM ? Location: [facility] ? Patient #: [id number] ? DOB: [birth year] ? Married / Language: English / Race: White ? Male ?  ? History of Present Illness Oscar Mccarty; 11/03/2019 12:48 PM) ? The patient is a 54 year old male who presents with colorectal cancer. Note for "Colorectal cancer": ` ? ` ? ` ? The patient returns s/p laparoscopic splenic flexure mobilization and left sided colectomy for ascending colon cancer 03/11/2019 ?  ? Patient is feeling better overall. He completed 12 cycles of FOLFOX in late February. Sent to consider port removal. He is due for his staging post adjuvant CT scans May 6. His daughter is getting married on May 15. He is hoping to have it done on May 19 or 20th. He otherwise feels well. Appetite good. No fevers or chills. ?  ?  ?  ?  ? Pathology T4 N1. ? 1. 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 ? Microsatellite stable, no loss of mismatch repair protein expression ? ? CTs 12/24/2018- sigmoid: Wall thickening, multiple enlarged nodes adjacent to the sigmoid colon, no evidence of metastatic disease ? ? Cycle 1 FOLFOX 04/14/2019 ? ? Cycle 2 FOLFOX 04/30/2019, Udenyca added ? ? Cycle 3 FOLFOX 05/14/2019 (oxaliplatin dose reduced to 65 mg/m due to thrombocytopenia) ? ? Cycle 4 FOLFOX 05/27/2019 (continue oxaliplatin dose reduction to 33m/m2, PLT 129K) ? ? Cycle 5 FOLFOX 06/10/2019 ? ? Cycle 6 FOLFOX 06/24/2019 ? ? Cycle 7 FOLFOX 07/08/2019 ? ? Cycle 8 FOLFOX 07/22/2019 ? ? Cycle 9 FOLFOX 08/05/2019 ? ? Cycle 10 FOLFOX 08/19/2019 ? ? Cycle 11 FOLFOX 09/02/2019 ? ? Cycle 12 FOLFOX 09/16/2019 ?  ?  ?  ? ` ?  ? Pathology: ? Diagnosis 1. Colon, segmental resection for tumor, sigmoid colon and descending colon open end proximal - ADENOCARCINOMA, MODERATELY DIFFERENTIATED, 4.0 CM - CARCINOMA FOCALLY INVOLVES SEROSA - ONE TUMOR DEPOSIT -  LYMPHOVASCULAR SPACE INVASION PRESENT - NO CARCINOMA IDENTIFIED IN THIRTEEN LYMPH NODES (0/13) - SEE ONCOLOGY TABLE AND COMMENT BELOW 2. Colon, resection margin (donut), final distal margin - BENIGN COLON - NO MALIGNANCY IDENTIFIED Microscopic Comment 1. COLON AND RECTUM: Resection, Including Transanal Disk Excision of Rectal Neoplasms Procedure: Left hemicolectomy Tumor Site: Sigmoid Tumor Size: 4.0 cm Macroscopic Tumor Perforation: Not identified Histologic Type: Adenocarcinoma Histologic Grade: G2 (Moderately differentiated) Tumor Extension: Tumor invades the visceral peritoneum Margins: All margins uninvolved by invasive carcinoma, high grade dysplasia/intramucosal carcinoma, and low grade dysplasia Treatment effect: No known presurgical therapy Lymphovascular Invasion: Present Perineural Invasion: Not identified Tumor Deposits: Present, one Regional Lymph Nodes: Number of Lymph Nodes Involved: 13 Number of Lymph Nodes Examined: 0 Pathologic Stage Classification (pTNM, AJCC 8th Edition): pT4a, pN1c Ancillary Studies: Pending 1 of 3 FINAL for Oscar Mccarty, Oscar Mccarty (([id number] Microscopic Comment(continued) Representative tumor block: 1C Comments: Dr. KVic Ripperreviewed the case and agrees with the above diagnosis. (v4.0.1.0) DThressa ShellerMD Pathologist, Electronic Signature (Case signed 03/16/2019) Specimen Oscar Mccarty and Clinical Information Specimen(s) Obtained: 1. Colon, segmental resection for tumor, sigmoid colon and descending colon open end proximal 2. Colon, resection margin (donut), final distal margin Specimen Clinical Information 1. colon cancer (kp) Oscar Mccarty 1. Specimen: Descending colon and sigmoid, open end is proximal. Specimen integrity: Intact. Specimen length: 28 cm. ?  ?  ? Mesorectal intactness: Not applicable. Tumor size: 4 cm in length, 4 cm in width, tan-red firm sessile mass with rolled edges. Percent of  bowel circumference involved: 100%. Tumor distance  to margins: Proximal: 7 cm. Distal: 17 cm. Mesenteric (sigmoid and transverse): 5 cm. Macroscopic extent of tumor invasion: On sectioning, the tumor mass is up to 1.6 cm thick, involving the entire thickness of the wall and into underlying fat. The tumor also abuts the serosa, with focal possible involvement of serosa. Total presumed lymph nodes: Found are fourteen possible lymph nodes ranging from 0.3 to 2.1 cm. The largest has gray-white to yellow-red firm to friable cut surfaces. Extramural satellite tumor nodules: None. Mucosal polyp(s): None. Additional findings: None. Block summary: Thirteen blocks A = proximal margin B = distal margin C = deepest tumor invasion into fat D-E = tumor mass with possible serosal involvement. F = tumor and adjacent mucosa G = tissue for molecular testing H = four nodes I = three nodes J = three nodes K = three nodes L-M = largest node. 2. In formalin is a 1.4 cm in length and 2.2 cm in diameter ring of tan-pink smooth to wrinkled mucosa with underlying embedded metallic staples in the wall. Representative sections in one block. (SSW:ah 03/13/19) 2 of 3 FINAL for Oscar Mccarty, Oscar Mccarty ([id number]) Disclaimer Some of these immunohistochemical stains may have been developed and the performance characteristics determined by [facility]. Some may not have been cleared or approved by the U.S. Food and Drug Administration. The FDA has determined that such clearance or approval is not necessary. This test is used for clinical purposes. It should not be regarded as investigational or for research. This laboratory is certified under the [place] (CLIA-88) as qualified to perform high complexity clinical laboratory testing. Report signed out from the following location(s) Technical Component was performed at [facility] [address], [place], [place] [address]. CLIA #: [id number],  Interpretation was performed at [address], [place], [place] [address]. CLIA #: [id number], 3 of 3 ?  ?  ? Result Notes for Surgical pathology ?  ? Notes recorded by Michael Boston, Mccarty on 03/16/2019 at 12:11 PM EDT Patient s/p surgery for Descending colon cancer 03/11/2019 pT4a, pN1c Alisha, CCS CMA: 1. Please set the patient up for GI Tumor Board. Patient Care Team: Enid Skeens., Mccarty as PCP - General (Family Medicine) Ladell Pier, Mccarty as Consulting Physician (Oncology) Jackquline Denmark, Mccarty as Consulting Physician (Gastroenterology) Michael Boston, Mccarty as Consulting Physician (General Surgery) 2. Please set up consult(s): Dr Benay Spice Diagnosis 1. Colon, segmental resection for tumor, sigmoid colon and descending colon open end proximal - ADENOCARCINOMA, MODERATELY DIFFERENTIATED, 4.0 CM - CARCINOMA FOCALLY INVOLVES SEROSA - ONE TUMOR DEPOSIT - LYMPHOVASCULAR SPACE INVASION PRESENT - NO CARCINOMA IDENTIFIED IN THIRTEEN LYMPH NODES (0/13) - SEE ONCOLOGY TABLE AND COMMENT BELOW 2. Colon, resection margin (donut), final distal margin - BENIGN COLON - NO MALIGNANCY IDENTIFIED Microscopic Comment 1. COLON AND RECTUM: Resection, Including Transanal Disk Excision of Rectal Neoplasms Procedure: Left hemicolectomy Tumor Site: Sigmoid Tumor Size: 4.0 cm Macroscopic Tumor Perforation: Not identified Histologic Type: Adenocarcinoma Histologic Grade: G2 (Moderately differentiated) Tumor Extension: Tumor invades the visceral peritoneum Margins: All margins uninvolved by invasive carcinoma, high grade dysplasia/intramucosal carcinoma, and low grade dysplasia Treatment effect: No known presurgical therapy Lymphovascular Invasion: Present Perineural Invasion: Not identified Tumor Deposits: Present, one Regional Lymph Nodes: Number of Lymph Nodes Involved: 13 Number of Lymph Nodes Examined: 0 Pathologic Stage Classification (pTNM, AJCC 8th  Edition): pT4a, pN1c Ancillary Studies: Pending 1 of 3 FINAL for  Oscar Mccarty, Oscar Mccarty ([id number]) Microscopic Comment(continued) Representative tumor block: 1C Comments: Dr. Vic Ripper reviewed the case and agrees with the above diagnosis. (v4.0.1.0) Thressa Sheller Mccarty Pathologist, Electronic Signature (Case signed 03/16/2019) Specimen Oscar Mccarty and Clinical Information Specimen(s) Obtained: 1. Colon, segmental resection for tumor, sigmoid colon and descending colon open end proximal 2. Colon, resection margin (donut), final distal margin Specimen Clinical Information 1. colon cancer (kp) Oscar Mccarty 1. Specimen: Descending colon and sigmoid, open end is proximal. Specimen integrity: Intact. Specimen length: 28 cm. Mesorectal intactness: Not applicable. Tumor size: 4 cm in length, 4 cm in width, tan-red firm sessile mass with rolled edges. Percent of bowel circumference involved: 100%. Tumor distance to margins: Proximal: 7 cm. Distal: 17 cm. Mesenteric (sigmoid and transverse): 5 cm. Macroscopic extent of tumor invasion: On sectioning, the tumor mass is up to 1.6 cm thick, involving the entire thickness of the wall and into underlying fat. The tumor also abuts the serosa, with focal possible involvement of serosa. Total presumed lymph nodes: Found are fourteen possible lymph nodes ranging from 0.3 to 2.1 cm. The largest has gray-white to yellow-red firm to friable cut surfaces. Extramural satellite tumor nodules: None. Mucosal polyp(s): None. Additional findings: None. Block summary: Thirteen blocks A = proximal margin B = distal margin C = deepest tumor invasion into fat D-E = tumor mass with possible serosal involvement. F = tumor and adjacent mucosa G = tissue for molecular testing H = four nodes I = three nodes J = three nodes K = three nodes L-M = largest node. 2. In formalin is a 1.4 cm in length and 2.2 cm in diameter ring of tan-pink smooth to wrinkled mucosa with underlying  embedded metallic staples in the wall. Representative sections in one block. (SSW:ah 03/13/19) 2 of 3 FINAL for Oscar Mccarty, Oscar Mccarty ([id number]) Disclaimer Some of these immunohistochemical stains may have been developed and the performance characteristics determined by [facility]. Some may not have been cleared or approved by the U.S. Food and Drug Administration. The FDA has determined that such clearance or approval is not necessary. This test is used for clinical purposes. It should not be regarded as investigational or for research. This laboratory is certified under the [place] (CLIA-88) as qualified to perform high complexity clinical laboratory testing. Report signed out from the following location(s) Technical Component was performed at [facility] [address], [place], [place] [address]. CLIA #: [id number], Interpretation was performed at [address], [place], [place] [address]. CLIA #: [id number], 3 of 3 ?  ? ` ? ` ? ` ?  ? 03/11/2019 ?  ? 5:12 PM ?  ? PATIENT: Oscar Mccarty 54 y.o. male ?  ? Patient Care Team: ? Enid Skeens., Mccarty as PCP - General (Family Medicine) ? Ladell Pier, Mccarty as Consulting Physician (Oncology) ? Jackquline Denmark, Mccarty as Consulting Physician (Gastroenterology) ? Michael Boston, Mccarty as Consulting Physician (General Surgery) ?  ? PRE-OPERATIVE DIAGNOSIS: SIGMOID COLON CANCER ?  ? POST-OPERATIVE DIAGNOSIS: DISTAL DESCENDING COLON CANCER ?  ? PROCEDURE:  ? XI ROBOTIC ASSISTED PARTIAL COLECTOMY (DESCENDING & SIGMOID COLON) ? MOBILIZATION OF SPLENIC FLEXURE ? BILATERAL TAP BLOCK ? RIGID PROCTOSCOPY ?  ? SURGEON: Oscar Hector, Mccarty ?  ? ASSISTANT: Carlena Hurl, PA-C ?  ? ANESTHESIA: local and general ?  ? EBL: Total I/O ? In: 3000 [I.V.:3000] ? Out: 200 [Urine:150; Blood:50] ?  ? Delay start of Pharmacological VTE agent (>  24hrs)  due to surgical blood loss or risk of bleeding: no ?  ? DRAINS: No ?  ? SPECIMEN: Descending and sigmoid colon. Open end proximal. Strongest tattooed near the obvious cancer ?  ? DISPOSITION OF SPECIMEN: PATHOLOGY ?  ? COUNTS: YES ?  ? PLAN OF CARE: Admit to inpatient ?  ? PATIENT DISPOSITION: PACU - hemodynamically stable. ?  ? INDICATION:  ?  ? Pleasant gentleman developed worsening rectal bleeding. Found to have bulky tumor in upper sigmoid colon. Surgery recommended. He ended up developing COVID pneumonitis requiring hospitalization in June. He is recovered and is now COVID negative. Because of his bleeding surgery moved up to this month. I recommended segmental resection: ?  ? The anatomy & physiology of the digestive tract was discussed. The pathophysiology was discussed. Natural history risks without surgery was discussed. I worked to give an overview of the disease and the frequent need to have multispecialty involvement. I feel the risks of no intervention will lead to serious problems that outweigh the operative risks; therefore, I recommended a partial colectomy to remove the pathology. Laparoscopic & open techniques were discussed.  ? Risks such as bleeding, infection, abscess, leak, reoperation, possible ostomy, hernia, heart attack, death, and other risks were discussed. I noted a good likelihood this will help address the problem. Goals of post-operative recovery were discussed as well. We will work to minimize complications. Educational materials on the pathology had been given in the office. Questions were answered.  ?  ? The patient expressed understanding & wished to proceed with surgery. ?  ? OR FINDINGS: ?  ? Patient had redundant descending and sigmoid colon with bulky tumor at the distal descending colon. Left conization done. Splenic flexure mobilization done to have tension-free reaching to the pelvis. ?  ? No obvious metastatic disease  on visceral parietal peritoneum or liver. ?  ? The anastomosis rests 14 cm from the anal verge by rigid proctoscopy. ?  ? DESCRIPTION: ?  ? Informed consent was confirmed. The patient underwent general anaesthesia without difficulty. The patient was positioned appropriately. VTE prevention in place. The patient was clipped, prepped, & draped in a sterile fashion. Surgical timeout confirmed our plan. ?  ? The patient was positioned in reverse Trendelenburg. Abdominal entry was gained using Varess technique at the left subcostal ridge on the anterior abdominal wall. No elevated EtCO2 noted. Port placed. Camera inspection revealed no injury. Extra ports were carefully placed under direct laparoscopic visualization. Could see strongest tattoo at the distal descending colon. Sigmoid rather redundant and corkscrewed upon itself. I reflected the greater omentum and the upper abdomen the small bowel in the upper abdomen. The patient was carefully positioned. The Intuitive daVinci robot was docked with camera & instruments carefully placed. ?  ? I scored the base of peritoneum of the medial side of the mesentery of the elevated left colon from the ligament of Treitz to the peritoneal reflection of the mid rectum. I elevated the sigmoid mesentery and entered into the retro-mesenteric plane. We were able to identify the left ureter and gonadal vessels. We kept those posterior within the retroperitoneum and elevated the left colon mesentery off that. I did isolate the inferior mesenteric artery (IMA) pedicle but did not ligate it yet. I continued distally and got into the avascular plane posterior to the mesorectum. His tissues were somewhat fibrotic in areas and very stretched out/rubbery and others. Eventually I was able to mobilize the rectum as well by freeing the mesorectum  off the sacrum. Identified the nervi ergentes days and kept them in the natural retroperitoneal midline position in its  usual wishbone pattern. I mobilized the peritoneal coverings towards the peritoneal reflection on both the right and left sides down to the junction between the proximal and mid rectum. I stayed away from the right and left ureters. I kept the lateral vascular pedicles to the rectum intact. ?  ? I was able to come between the window between the inferior mesenteric vein and artery. Free off the mid and proximal colon off the kidney. Identify and preserve the ureter and gonadal vessels and keep them in the natural retroperitoneal position. I skeletonized the lymph nodes off the inferior mesenteric artery pedicle. I went down to its takeoff from the aorta. I isolated the inferior mesenteric vein off of the ligament of Treitz just cephalad to that as well. After confirming the left ureter was out of the way, I went ahead and ligated the inferior mesenteric artery pedicle just near its takeoff from the aorta. I did ligate the inferior mesenteric vein in a similar fashion. We ensured hemostasis. I skeletonized the mesorectum at the junction at the proximal rectum for the distal point of resection. I mobilized the left colon in a lateral to medial fashion off the line of Toldt up towards the splenic flexure to ensure good mobilization of the remaining left colon to reach into the pelvis. Unfortunately he did not seem to wish to mobilize well. ?  ? Therefore I decided to do more formal splenic flexure mobilization. I reposition the patient in reversal [place]. Transected the greater omentum off the mid transverse colon and follow that distally. Was able to get the lesser sac and free the great omentum towards the splenic flexure. I could see his pancreas and I was able to get a window between the inferior pancreatic edge and the retroperitoneum to connect with my prior retroperitoneal dissection. Without I transected the retroperitoneal attachments of the distal transverse colon and splenic flexure  off the retroperitoneum especially to the kidney. Did that towards the midline. With that I got excellent mobilization of the entire left colon. That released the kinked up splenic flexure straighten it out and it came down much more easily off tension. Repositioned the patient in Trendelenburg. ?  ? I skeletonized at the proximal mesorectum and transected at the proximal rectum using a robotic 45 mm stapler. I chose a region at the proximal descending colon close to the splenic flexure that was soft and easily reached down to the rectal stump. I transected the mesentery of the colon radially to preserve remaining colon blood supply, ensuring that the inferior mesenteric vein and arterial pedicles were with the specimen. Preserving a good marginal artery. ?  ? I created an extraction incision through a small Pfannenstiel incision in the suprapubic region. Placed a wound protector. I was able to eviscerate the rectosigmoid and descending colon out the wound. I clamped the colon proximal to this area using a reusable pursestringer device. Passed a 2-0 Keith needle. I transected at the descending/sigmoid junction with a scalpel. I got healthy bleeding mucosa. We sent the rectosigmoid colon specimen off to go to pathology. We sized the colon orifice. I chose a 31 EEA anvil stapler system. I reinforced the prolene pursestring with interrupted silk suture. I placed the anvil to the open end of the proximal remaining colon and closed around it using the pursestring. We did copious irrigation with crystalloid solution. Hemostasis was good. The  distal end of the remaining colon easily reached down to the rectal stump, therefore, further colone mobilization was not needed.  ?  ? Ms Lemar Lofty scrubbed down and did gentle anal dilation and advanced the EEA stapler up the rectal stump. The spike was brought out at the provimal end of the rectal stump under direct visualization. I attached the anvil of  the proximal colon the spike of the stapler. Anvil was tightened down and held clamped for 60 seconds. The EEA stapler was fired and held clamped for 30 seconds. The stapler was released & removed. We noted 2 excellent anastomotic rings. Blue stitch is in the proximal ring. Ms Lemar Lofty We did a final irrigation of antibiotic solution (900 mg clindamycin/240 mg gentamicin in a liter of crystalloid) & held that for the pelvic air leak test . The rectum was insufflated the rectum while clamping the colon proximal to that anastomosis. There was a negative air leak test. There was no tension of mesentery or bowel at the anastomosis. Tissues looked viable. Ureters & bowel uninjured. The anastomosis looked healthy. ?  ? Endoluminal gas was evacuated. Ports & wound protector removed. I did rigid proctoscopy noted the anastomosis was at 14 cm from the anal verge consistent with the proximal rectum. We changed gloves & redraped the patient per colon SSI prevention protocol. We aspirated the antibiotic irrigation. Hemostasis was good. Sterile unused instruments were used from this point. I closed the skin at the port sites using Monocryl stitch and sterile dressing. I closed the extraction wound using a 0 Vicryl vertical peritoneal closure and a #1 PDS transverse anterior rectal fascial closure like a small Pfannenstiel closure. I closed the skin at the port sites and Pfannenstiel incision with 4-0 Monocryl stitches.  ?  ? Patient is being extubated go to recovery room. I had discussed postop care with the patient in detail the office & in the holding area. Instructions are written. I discussed operative findings, updated the patient's status, discussed probable steps to recovery, and gave postoperative recommendations to the patient's significant other. Myrtie Soman. Recommendations were made. Questions were answered. She expressed understanding & appreciation. ?  ? Oscar Hector, M.D.,  F.A.C.S. ? Gastrointestinal and Minimally Invasive Surgery ? [facility] ? [address] ? [place], [place] [address] ? [phone] Main / Paging ?  ?  ? Allergies Sabino Gasser, CMA; 11/03/2019 12:01 PM) ? No Known Drug Allergies [02/02/2019]: (Marked as Inactive) ? No Known Allergies [02/02/2019]: (Marked as Inactive) ? Allergies Reconciled ?  ? Medication History Sabino Gasser, [place]; 11/03/2019 12:01 PM) ? traMADol HCl (50MG Tablet, 1-2 Oral every six hours, as needed, Taken starting 04/01/2019) Active. ? Neomycin Sulfate (500MG Tablet, 2 (two) Oral SEE NOTE, Taken starting 02/02/2019) Active. (TAKE TWO TABLETS AT 2 PM, 3 PM, AND 10 PM THE DAY PRIOR TO SURGERY) ? Flagyl (500MG Tablet, 2 (two) Oral SEE NOTE, Taken starting 02/02/2019) Active. (Take at 2pm, 3pm, and 10pm the day prior to your colon operation) ? Ondansetron (8MG Tablet Disint, Oral) Active. ? glipiZIDE (5MG Tablet, Oral) Active. ? Ibuprofen (400MG Tablet, Oral) Active. ? midazolam Active. ? metFORMIN HCl (500MG Tablet, Oral) Active. ? Medications Reconciled ?  ?  ?  ? Vitals Sabino Gasser CMA; 11/03/2019 12:02 PM) ? 11/03/2019 12:01 PM ? Weight: 166.6 lb Height: 67in ? Body Surface Area: 1.87 m Body Mass Index: 26.09 kg/m  ? Temp.: 97.14F(Tympanic)  Pulse: 96 (Regular)  ? BP: 112/72 (Sitting, Left  Arm, Standard) ?  ?  ?  ?  ?  ?  ?  ? Physical Exam Oscar Mccarty; 11/03/2019 12:47 PM) ?  ? General ? Mental Status-Alert. ? General Appearance-Not in acute distress. ? Voice-Normal. ? Note: Relaxed. Nontoxic. ?  ? Integumentary ? Global Assessment ? Upon inspection and palpation of skin surfaces of the - Distribution of scalp and body hair is normal. ? General Characteristics ? Overall examination of the patient's skin reveals - no rashes and no suspicious lesions. ?  ? Head and Neck ? Head-normocephalic, atraumatic with no  lesions or palpable masses. ? Face ? Global Assessment - atraumatic, no absence of expression. ? Neck ? Global Assessment - no abnormal movements, no decreased range of motion. ? Trachea-midline. ? Thyroid ? Gland Characteristics - non-tender. ?  ? Eye ? Eyeball - Left-Extraocular movements intact, No Nystagmus - Left. ? Eyeball - Right-Extraocular movements intact, No Nystagmus - Right. ? Upper Eyelid - Left-No Cyanotic - Left. ? Upper Eyelid - Right-No Cyanotic - Right. ?  ? Chest and Lung Exam ? Inspection ? Accessory muscles - No use of accessory muscles in breathing. ? Note: Chest soft. Port at infraclavicular space. Mild dimpling on his thin dermis but no erosion. ?  ? Abdomen ? Note: Incisions with normal healing ridges. No active bleeding. No cellulitis. No guarding/rebound tenderness ?  ? Peripheral Vascular ? Upper Extremity ? Inspection - Left - Not Gangrenous, No Petechiae. Inspection - Right - Not Gangrenous, No Petechiae. ?  ? Neurologic ? Neurologic evaluation reveals -normal attention span and ability to concentrate, able to name objects and repeat phrases. Appropriate fund of knowledge and normal coordination. ?  ? Neuropsychiatric ? Mental status exam performed with findings of-able to articulate well with normal speech/language, rate, volume and coherence and no evidence of hallucinations, delusions, obsessions or homicidal/suicidal ideation. ? Orientation-oriented X3. ?  ? Musculoskeletal ? Global Assessment ? Gait and Station - normal gait and station. ?  ? Lymphatic ? General Lymphatics ? Description - No Generalized lymphadenopathy. ?  ?  ?  ? Assessment & Plan Oscar Mccarty; 11/03/2019 12:46 PM) ?  ? 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 Impression: Recovering relatively well status post robotic left hemicolectomy with splenic flexure mobilization for T4 N1 descending colon cancer. ?  ? Completed post-adjuvant  chemotherapy. As long as there are no surprises on his CAT scans on May 6, can discuss removal. He would like to wait until after his daughter's wedding on 15 May. Therefore the following week. He specifically was hoping 74 or 20th of May at [facility]. We can do that or the [facility]. I think it is reasonable go back to light duty work within a couple days and then unrestricted about 2 weeks later. ?  ? Current Plans ? I recommended surgery to remove the catheter. I explained the technique of removal with use of local anesthesia & possible need for more aggressive sedation/anesthesia for patient comfort. ?  ? Risks such as bleeding, infection, and other risks were discussed. Post-operative dressing/incision care was discussed. I noted a good likelihood this will help address the problem. We will work to minimize complications. Questions were answered. The patient expresses understanding & wishes to proceed with surgery. ?  ? You are being scheduled for surgery- Our schedulers will call you. ?  ? You should hear from our office's scheduling department within 5 working days about the location, date, and time of  surgery. We try to make accommodations for patient's preferences in scheduling surgery, but sometimes the OR schedule or the surgeon's schedule prevents [place] from making those accommodations. ?  ? If you have not heard from our office [phone]) in 5 working days, call the office and ask for your surgeon's nurse. ?  ? If you have other questions about your diagnosis, plan, or surgery, call the office and ask for your surgeon's nurse. ?  ?  ? HX CANCER OF COLORECTAL REGION - F/U PRN (Z85.048) ?  ? Current Plans ? Return to clinic as needed. ?  ? Soreness, decreased appetite, and poor energy level are common problems after surgery. While many people can struggle with a bad day, these concerns should gradually fade away or at least improve. Much of your recovery  depends on your health & the severity of your operation. Please call if you have any further questions / concerns related to surgery. ?  ? Increase activity as tolerated to regular everyday activity. Consider daily low impact exercise every day such as walking an hour a day. ?  ? Do not push through pain. If it hurts to do it, then don't do it. ?  ? Diet as tolerated. Low fat high fiber diet ideal. 30 g fiber a day ideal. Consider taking a daily fiber supplement to keep your bowels regular. ?  ? Followup with your primary care physician for other health issues as would normally be done. ?  ? Consider screening for malignancies (breast, prostate, colon, melanoma, etc) as appropriate. Discuss with you primary care physician. ?  ? Consider follow up colonoscopy by your gastroenterologist. ?  ? Since you had a colorectal cancer resected by surgery, you should strongly consider getting a colonoscopy by your gastroenterologict in one year after the surgery that removed your cancer. Call your gastroenterologist for advice ?  ? Pt Education - [facility] Colorectal Cancer (AT): discussed with patient and provided information.  Oscar Hector, Mccarty, FACS, MASCRS Gastrointestinal and Minimally Invasive Surgery  [facility] [address], [place], [place] [address] [phone] Main / Paging [phone] Fax

## 2019-11-26 ENCOUNTER — Encounter (HOSPITAL_COMMUNITY): Payer: Self-pay

## 2019-11-26 ENCOUNTER — Other Ambulatory Visit: Payer: Self-pay

## 2019-11-26 ENCOUNTER — Telehealth: Payer: Self-pay | Admitting: *Deleted

## 2019-11-26 ENCOUNTER — Ambulatory Visit (HOSPITAL_COMMUNITY)
Admission: RE | Admit: 2019-11-26 | Discharge: 2019-11-26 | Disposition: A | Discharge status: Home / Self Care | Payer: BC Managed Care – PPO | Source: Ambulatory Visit | Attending: Nurse Practitioner | Admitting: Nurse Practitioner

## 2019-11-26 ENCOUNTER — Inpatient Hospital Stay: Payer: BC Managed Care – PPO | Attending: Oncology

## 2019-11-26 DIAGNOSIS — C187 Malignant neoplasm of sigmoid colon: Secondary | ICD-10-CM | POA: Insufficient documentation

## 2019-11-26 DIAGNOSIS — Z9221 Personal history of antineoplastic chemotherapy: Secondary | ICD-10-CM | POA: Diagnosis not present

## 2019-11-26 DIAGNOSIS — I700 Atherosclerosis of aorta: Secondary | ICD-10-CM | POA: Diagnosis not present

## 2019-11-26 DIAGNOSIS — E119 Type 2 diabetes mellitus without complications: Secondary | ICD-10-CM | POA: Diagnosis not present

## 2019-11-26 DIAGNOSIS — G620 Drug-induced polyneuropathy: Secondary | ICD-10-CM | POA: Insufficient documentation

## 2019-11-26 LAB — CBC WITH DIFFERENTIAL (CANCER CENTER ONLY)
Abs Immature Granulocytes: 0.01 10*3/uL (ref 0.00–0.07)
Basophils Absolute: 0 10*3/uL (ref 0.0–0.1)
Basophils Relative: 1 %
Eosinophils Absolute: 0.1 10*3/uL (ref 0.0–0.5)
Eosinophils Relative: 3 %
HCT: 43.8 % (ref 39.0–52.0)
Hemoglobin: 14.5 g/dL (ref 13.0–17.0)
Immature Granulocytes: 0 %
Lymphocytes Relative: 35 %
Lymphs Abs: 1.2 10*3/uL (ref 0.7–4.0)
MCH: 28.2 pg (ref 26.0–34.0)
MCHC: 33.1 g/dL (ref 30.0–36.0)
MCV: 85 fL (ref 80.0–100.0)
Monocytes Absolute: 0.4 10*3/uL (ref 0.1–1.0)
Monocytes Relative: 10 %
Neutro Abs: 1.8 10*3/uL (ref 1.7–7.7)
Neutrophils Relative %: 51 %
Platelet Count: 106 10*3/uL — ABNORMAL LOW (ref 150–400)
RBC: 5.15 MIL/uL (ref 4.22–5.81)
RDW: 13.3 % (ref 11.5–15.5)
WBC Count: 3.5 10*3/uL — ABNORMAL LOW (ref 4.0–10.5)
nRBC: 0 % (ref 0.0–0.2)

## 2019-11-26 LAB — CMP (CANCER CENTER ONLY)
ALT: 36 U/L (ref 0–44)
AST: 29 U/L (ref 15–41)
Albumin: 4.3 g/dL (ref 3.5–5.0)
Alkaline Phosphatase: 100 U/L (ref 38–126)
Anion gap: 10 (ref 5–15)
BUN: 12 mg/dL (ref 6–20)
CO2: 24 mmol/L (ref 22–32)
Calcium: 9.3 mg/dL (ref 8.9–10.3)
Chloride: 107 mmol/L (ref 98–111)
Creatinine: 0.77 mg/dL (ref 0.61–1.24)
GFR, Est AFR Am: >60 mL/min (ref >60)
GFR, Estimated: >60 mL/min (ref >60)
Glucose, Bld: 98 mg/dL (ref 70–99)
Potassium: 4 mmol/L (ref 3.5–5.1)
Sodium: 141 mmol/L (ref 135–145)
Total Bilirubin: 0.6 mg/dL (ref 0.3–1.2)
Total Protein: 7.6 g/dL (ref 6.5–8.1)

## 2019-11-26 LAB — CEA (IN HOUSE-CHCC): CEA (CHCC-In House): 1.28 ng/mL (ref 0.00–5.00)

## 2019-11-26 MED ORDER — SODIUM CHLORIDE (PF) 0.9 % IJ SOLN
INTRAMUSCULAR | Status: AC
  Filled 2019-11-26: qty 50

## 2019-11-26 MED ORDER — HEPARIN SOD (PORK) LOCK FLUSH 100 UNIT/ML IV SOLN
500.0000 [IU] | Freq: Once | INTRAVENOUS | Status: AC
  Administered 2019-11-26: 12:00:00 500 [IU] via INTRAVENOUS

## 2019-11-26 MED ORDER — IOHEXOL 300 MG/ML  SOLN
100.0000 mL | Freq: Once | INTRAMUSCULAR | Status: AC | PRN
  Administered 2019-11-26: 100 mL via INTRAVENOUS

## 2019-11-26 MED ORDER — HEPARIN SOD (PORK) LOCK FLUSH 100 UNIT/ML IV SOLN
INTRAVENOUS | Status: AC
  Filled 2019-11-26: qty 5

## 2019-11-26 NOTE — Telephone Encounter (Signed)
Called to inquire about his lab results today and was informed. Asking what is the appointment on Monday for? Informed him it is to review the CT results and determine what f/u is required for surveillance.

## 2019-11-27 ENCOUNTER — Ambulatory Visit (HOSPITAL_COMMUNITY): Payer: BC Managed Care – PPO

## 2019-11-27 ENCOUNTER — Other Ambulatory Visit: Payer: BC Managed Care – PPO

## 2019-11-30 ENCOUNTER — Inpatient Hospital Stay (HOSPITAL_BASED_OUTPATIENT_CLINIC_OR_DEPARTMENT_OTHER): Payer: BC Managed Care – PPO | Admitting: Oncology

## 2019-11-30 ENCOUNTER — Other Ambulatory Visit: Payer: Self-pay

## 2019-11-30 VITALS — BP 121/77 | HR 59 | Temp 98.7°F | Resp 17 | Ht 67.0 in | Wt 166.8 lb

## 2019-11-30 DIAGNOSIS — C187 Malignant neoplasm of sigmoid colon: Secondary | ICD-10-CM | POA: Diagnosis not present

## 2019-11-30 NOTE — Progress Notes (Signed)
[facility] OFFICE PROGRESS NOTE   Diagnosis: Colon cancer  INTERVAL HISTORY:   Oscar Mccarty returns for a scheduled visit.  He feels well.  He is working.  Good appetite.  He has mild numbness in the hands and feet.  This does not interfere with activity.   Objective:  Vital signs in last 24 hours:  Blood pressure 121/77, pulse (!) 59, temperature 98.7 F (37.1 C), temperature source Temporal, resp. rate 17, height 5' 7"  (1.702 m), weight 166 lb 12.8 oz (75.7 kg), SpO2 97 %.    Lymphatics: No cervical, supraclavicular, axillary, or inguinal nodes  GI: No hepatosplenomegaly, nontender, no mass Vascular: No leg edema   Portacath/PICC-without erythema  Lab Results:  Lab Results  Component Value Date   WBC 3.5 (L) 11/26/2019   HGB 14.5 11/26/2019   HCT 43.8 11/26/2019   MCV 85.0 11/26/2019   PLT 106 (L) 11/26/2019   NEUTROABS 1.8 11/26/2019    CMP  Lab Results  Component Value Date   NA 141 11/26/2019   K 4.0 11/26/2019   CL 107 11/26/2019   CO2 24 11/26/2019   GLUCOSE 98 11/26/2019   BUN 12 11/26/2019   CREATININE 0.77 11/26/2019   CALCIUM 9.3 11/26/2019   PROT 7.6 11/26/2019   ALBUMIN 4.3 11/26/2019   AST 29 11/26/2019   ALT 36 11/26/2019   ALKPHOS 100 11/26/2019   BILITOT 0.6 11/26/2019   GFRNONAA >60 11/26/2019   GFRAA >60 11/26/2019    Lab Results  Component Value Date   CEA1 1.28 11/26/2019     Imaging:  CT Chest W Contrast  Result Date: 11/26/2019 CLINICAL DATA:  Sigmoid colon cancer diagnosed May 2020, surgically treated and August 2020. Chemotherapy, radiation therapy, and immunotherapy completed. EXAM: CT CHEST, ABDOMEN, AND PELVIS WITH CONTRAST TECHNIQUE: Multidetector CT imaging of the chest, abdomen and pelvis was performed following the standard protocol during bolus administration of intravenous contrast. CONTRAST:  12m OMNIPAQUE IOHEXOL 300 MG/ML  SOLN COMPARISON:  01/20/2019 FINDINGS: CT CHEST FINDINGS Cardiovascular:  Right Port-A-Cath tip: Cavoatrial junction. Mild atherosclerotic calcification in the aortic arch. Mediastinum/Nodes: Unremarkable Lungs/Pleura: Biapical pleuroparenchymal scarring. Old granulomatous disease. 3 by 4 mm left upper lobe nodule on image 88/6 is chronically stable. Subsegmental atelectasis both lower lobes. Musculoskeletal: Unremarkable CT ABDOMEN PELVIS FINDINGS Hepatobiliary: Unremarkable Pancreas: Unremarkable Spleen: Unremarkable Adrenals/Urinary Tract: Unremarkable Stomach/Bowel: Partial colectomy. Staple line appears normal in the pelvis, image 106/2. There is some frothy fluid in the distal colon and rectum, diarrheal process suspected. Vascular/Lymphatic: Aortoiliac atherosclerotic vascular disease. No pathologic adenopathy observed. Reproductive: Unremarkable Other: No supplemental non-categorized findings. Musculoskeletal: Lumbar spondylosis and degenerative disc disease causing impingement at L4-5 and L5-S1. IMPRESSION: 1. No findings of recurrent malignancy. 2. Other imaging findings of potential clinical significance: Old granulomatous disease. Lumbar spondylosis and degenerative disc disease causing impingement at L4-5 and L5-S1. 3. Aortic atherosclerosis. Aortic Atherosclerosis (ICD10-I70.0). Electronically Signed   By: WVan ClinesM.D.   On: 11/26/2019 13:36   CT Abdomen Pelvis W Contrast  Result Date: 11/26/2019 CLINICAL DATA:  Sigmoid colon cancer diagnosed May 2020, surgically treated and August 2020. Chemotherapy, radiation therapy, and immunotherapy completed. EXAM: CT CHEST, ABDOMEN, AND PELVIS WITH CONTRAST TECHNIQUE: Multidetector CT imaging of the chest, abdomen and pelvis was performed following the standard protocol during bolus administration of intravenous contrast. CONTRAST:  1038mOMNIPAQUE IOHEXOL 300 MG/ML  SOLN COMPARISON:  01/20/2019 FINDINGS: CT CHEST FINDINGS Cardiovascular: Right Port-A-Cath tip: Cavoatrial junction. Mild atherosclerotic calcification in  the aortic  arch. Mediastinum/Nodes: Unremarkable Lungs/Pleura: Biapical pleuroparenchymal scarring. Old granulomatous disease. 3 by 4 mm left upper lobe nodule on image 88/6 is chronically stable. Subsegmental atelectasis both lower lobes. Musculoskeletal: Unremarkable CT ABDOMEN PELVIS FINDINGS Hepatobiliary: Unremarkable Pancreas: Unremarkable Spleen: Unremarkable Adrenals/Urinary Tract: Unremarkable Stomach/Bowel: Partial colectomy. Staple line appears normal in the pelvis, image 106/2. There is some frothy fluid in the distal colon and rectum, diarrheal process suspected. Vascular/Lymphatic: Aortoiliac atherosclerotic vascular disease. No pathologic adenopathy observed. Reproductive: Unremarkable Other: No supplemental non-categorized findings. Musculoskeletal: Lumbar spondylosis and degenerative disc disease causing impingement at L4-5 and L5-S1. IMPRESSION: 1. No findings of recurrent malignancy. 2. Other imaging findings of potential clinical significance: Old granulomatous disease. Lumbar spondylosis and degenerative disc disease causing impingement at L4-5 and L5-S1. 3. Aortic atherosclerosis. Aortic Atherosclerosis (ICD10-I70.0). Electronically Signed   By: Van Clines M.D.   On: 11/26/2019 13:36    Medications: I have reviewed the patient's current medications.   Assessment/Plan: 1. 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 Microsatellite stable, no loss of mismatch repair protein expression ? CTs 12/24/2018-sigmoid: Wall thickening, multiple enlarged nodes adjacent to the sigmoid colon, no evidence of metastatic disease ? Cycle 1 FOLFOX 04/14/2019 ? Cycle 2 FOLFOX 04/30/2019, Udenyca added ? Cycle 3 FOLFOX 05/14/2019 (oxaliplatin dose reduced to 65 mg/m due to thrombocytopenia) ? Cycle 4 FOLFOX 05/27/2019 (continue oxaliplatin dose reduction to 43m/m2, PLT 129K) ? Cycle 5 FOLFOX 06/10/2019 ? Cycle 6 FOLFOX 06/24/2019 ? Cycle 7  FOLFOX 07/08/2019 ? Cycle 8 FOLFOX 07/22/2019 ? Cycle 9 FOLFOX 08/05/2019 ? Cycle 10 FOLFOX 08/19/2019 ? Cycle 11 FOLFOX 09/02/2019 ? Cycle 12 FOLFOX 09/16/2019 ? CTs 11/26/2019-negative 2. COVID-19 respiratory infection June 2020 3. Diabetes diagnosed June 2020 4. Port-A-Cath placement, Dr. GJohney Maine 04/09/2019 5. Heartburn following chemotherapy, prilosec added 06/10/19 6. Periodic dizziness on standing, no significant orthostatic hypotension. Hold lisinopril as of 06/24/19 7.  Oxaliplatin neuropathy    Disposition: Mr. DMizunois in clinical remission from colon cancer.  He will return for an office visit and CEA in 6 months.  Hopefully the neuropathy symptoms will improve over the next several months.  He will call for worsening of neuropathy symptoms.  Mr. DGipeplans to receive the COVID-19 vaccine.  We will refer him to Dr. GLyndel Safefor a surveillance colonoscopy.  He is scheduled for Port-A-Cath removal by Dr. GJohney Maine  GBetsy Coder MD  11/30/2019  9:41 AM

## 2019-12-02 ENCOUNTER — Encounter (HOSPITAL_BASED_OUTPATIENT_CLINIC_OR_DEPARTMENT_OTHER): Payer: Self-pay | Admitting: Surgery

## 2019-12-03 ENCOUNTER — Telehealth: Payer: Self-pay | Admitting: Oncology

## 2019-12-03 NOTE — Telephone Encounter (Signed)
Scheduled per los. Called and left msg. Mailed printout  °

## 2019-12-04 ENCOUNTER — Encounter (HOSPITAL_BASED_OUTPATIENT_CLINIC_OR_DEPARTMENT_OTHER): Payer: Self-pay | Admitting: Surgery

## 2019-12-07 ENCOUNTER — Other Ambulatory Visit (HOSPITAL_COMMUNITY)
Admission: RE | Admit: 2019-12-07 | Discharge: 2019-12-07 | Disposition: A | Discharge status: Home / Self Care | Payer: BC Managed Care – PPO | Source: Ambulatory Visit | Attending: Surgery | Admitting: Surgery

## 2019-12-07 ENCOUNTER — Other Ambulatory Visit: Payer: Self-pay

## 2019-12-07 ENCOUNTER — Encounter (HOSPITAL_BASED_OUTPATIENT_CLINIC_OR_DEPARTMENT_OTHER): Payer: Self-pay | Admitting: Surgery

## 2019-12-07 DIAGNOSIS — Z01812 Encounter for preprocedural laboratory examination: Secondary | ICD-10-CM | POA: Diagnosis present

## 2019-12-07 DIAGNOSIS — Z20822 Contact with and (suspected) exposure to covid-19: Secondary | ICD-10-CM | POA: Insufficient documentation

## 2019-12-07 NOTE — Progress Notes (Signed)
Spoke w/ via phone for pre-op interview---patient and lynette [place] interpreters number [phone] lynette Lab needs dos---I stat 8-              Lab results------ekg 03-05-2019 epic, chest ct [id number] epic COVID test ------12-07-2019 1500 Arrive at -------530 am 12-10-2019 NPO after ------midnight  Medications to take morning of surgery -----none Diabetic medication -----none day of surgery Patient Special Instructions -----none Pre-Op special Istructions -----none Patient verbalized understanding of instructions that were given at this phone interview. Patient denies shortness of breath, chest pain, fever, cough a this phone interview.  Patient speaks english well and states he does not need spanish interpreter for day of surgery 12-10-2019.

## 2019-12-08 LAB — SARS CORONAVIRUS 2 (TAT 6-24 HRS): SARS Coronavirus 2: NEGATIVE

## 2019-12-09 NOTE — Anesthesia Preprocedure Evaluation (Addendum)
Anesthesia Evaluation  Patient identified by MRN, date of birth, ID band Patient awake    Reviewed: Allergy & Precautions, NPO status , Patient's Chart, lab work & pertinent test results  Airway Mallampati: II  TM Distance: >3 FB Neck ROM: Full    Dental no notable dental hx. (+) Teeth Intact, Dental Advisory Given   Pulmonary neg pulmonary ROS,    Pulmonary exam normal breath sounds clear to auscultation       Cardiovascular Exercise Tolerance: Good Normal cardiovascular exam Rhythm:Regular Rate:Normal     Neuro/Psych  Headaches, negative psych ROS   GI/Hepatic Neg liver ROS, GERD  ,Stage 3 colorectal CA   Endo/Other  diabetes, Type 2, Oral Hypoglycemic Agents  Renal/GU negative Renal ROS     Musculoskeletal negative musculoskeletal ROS (+)   Abdominal   Peds  Hematology negative hematology ROS (+)   Anesthesia Other Findings   Reproductive/Obstetrics                            Anesthesia Physical Anesthesia Plan  ASA: III  Anesthesia Plan: MAC   Post-op Pain Management:    Induction: Intravenous  PONV Risk Score and Plan: 2 and Treatment may vary due to age or medical condition, Ondansetron, Dexamethasone and Midazolam  Airway Management Planned: Nasal Cannula and Natural Airway  Additional Equipment: None  Intra-op Plan:   Post-operative Plan:   Informed Consent: I have reviewed the patients History and Physical, chart, labs and discussed the procedure including the risks, benefits and alternatives for the proposed anesthesia with the patient or authorized representative who has indicated his/her understanding and acceptance.     Dental advisory given  Plan Discussed with: CRNA  Anesthesia Plan Comments: (Removal of portacath)       Anesthesia Quick Evaluation

## 2019-12-10 ENCOUNTER — Ambulatory Visit (HOSPITAL_BASED_OUTPATIENT_CLINIC_OR_DEPARTMENT_OTHER): Payer: BC Managed Care – PPO | Admitting: Anesthesiology

## 2019-12-10 ENCOUNTER — Encounter (HOSPITAL_BASED_OUTPATIENT_CLINIC_OR_DEPARTMENT_OTHER): Payer: Self-pay | Admitting: Surgery

## 2019-12-10 ENCOUNTER — Other Ambulatory Visit: Payer: Self-pay

## 2019-12-10 ENCOUNTER — Ambulatory Visit (HOSPITAL_BASED_OUTPATIENT_CLINIC_OR_DEPARTMENT_OTHER)
Admission: RE | Admit: 2019-12-10 | Discharge: 2019-12-10 | Disposition: A | Discharge status: Home / Self Care | Payer: BC Managed Care – PPO | Attending: Surgery | Admitting: Surgery

## 2019-12-10 ENCOUNTER — Encounter (HOSPITAL_BASED_OUTPATIENT_CLINIC_OR_DEPARTMENT_OTHER)
Admission: RE | Disposition: A | Discharge status: Home / Self Care | Payer: Self-pay | Source: Home / Self Care | Attending: Surgery

## 2019-12-10 DIAGNOSIS — Z7984 Long term (current) use of oral hypoglycemic drugs: Secondary | ICD-10-CM | POA: Diagnosis not present

## 2019-12-10 DIAGNOSIS — C186 Malignant neoplasm of descending colon: Secondary | ICD-10-CM | POA: Diagnosis not present

## 2019-12-10 DIAGNOSIS — Z9049 Acquired absence of other specified parts of digestive tract: Secondary | ICD-10-CM | POA: Insufficient documentation

## 2019-12-10 DIAGNOSIS — Z452 Encounter for adjustment and management of vascular access device: Secondary | ICD-10-CM | POA: Diagnosis present

## 2019-12-10 DIAGNOSIS — Z79899 Other long term (current) drug therapy: Secondary | ICD-10-CM | POA: Diagnosis not present

## 2019-12-10 DIAGNOSIS — C187 Malignant neoplasm of sigmoid colon: Secondary | ICD-10-CM | POA: Diagnosis not present

## 2019-12-10 HISTORY — PX: PORT-A-CATH REMOVAL: SHX5289

## 2019-12-10 HISTORY — DX: Migraine, unspecified, not intractable, without status migrainosus: G43.909

## 2019-12-10 LAB — GLUCOSE, CAPILLARY
Glucose-Capillary: 87 mg/dL (ref 70–99)
Glucose-Capillary: 119 mg/dL — ABNORMAL HIGH (ref 70–99)

## 2019-12-10 SURGERY — REMOVAL PORT-A-CATH
Anesthesia: Monitor Anesthesia Care | Site: Chest | Laterality: Right

## 2019-12-10 MED ORDER — FENTANYL CITRATE (PF) 100 MCG/2ML IJ SOLN
INTRAMUSCULAR | Status: DC | PRN
  Administered 2019-12-10 (×2): 50 ug via INTRAVENOUS

## 2019-12-10 MED ORDER — ACETAMINOPHEN 500 MG PO TABS
1000.0000 mg | ORAL_TABLET | Freq: Once | ORAL | Status: AC
  Administered 2019-12-10: 1000 mg via ORAL

## 2019-12-10 MED ORDER — DEXAMETHASONE SODIUM PHOSPHATE 4 MG/ML IJ SOLN
INTRAMUSCULAR | Status: DC | PRN
  Administered 2019-12-10: 4 mg via INTRAVENOUS

## 2019-12-10 MED ORDER — ONDANSETRON HCL 4 MG/2ML IJ SOLN: 4.0000 mg | Freq: Once | INTRAMUSCULAR | Status: DC | PRN

## 2019-12-10 MED ORDER — LIDOCAINE 2% (20 MG/ML) 5 ML SYRINGE
INTRAMUSCULAR | Status: AC
  Filled 2019-12-10: qty 5

## 2019-12-10 MED ORDER — LIDOCAINE-EPINEPHRINE (PF) 1 %-1:200000 IJ SOLN
INTRAMUSCULAR | Status: DC | PRN
  Administered 2019-12-10: 20 mL

## 2019-12-10 MED ORDER — LIDOCAINE HCL (CARDIAC) PF 100 MG/5ML IV SOSY
PREFILLED_SYRINGE | INTRAVENOUS | Status: DC | PRN
  Administered 2019-12-10: 60 mg via INTRAVENOUS

## 2019-12-10 MED ORDER — LACTATED RINGERS IV SOLN: INTRAVENOUS | Status: DC

## 2019-12-10 MED ORDER — CEFAZOLIN SODIUM-DEXTROSE 2-4 GM/100ML-% IV SOLN
INTRAVENOUS | Status: AC
  Filled 2019-12-10: qty 100

## 2019-12-10 MED ORDER — OXYCODONE HCL 5 MG/5ML PO SOLN: 5.0000 mg | Freq: Once | ORAL | Status: DC | PRN

## 2019-12-10 MED ORDER — MIDAZOLAM HCL 5 MG/5ML IJ SOLN
INTRAMUSCULAR | Status: DC | PRN
  Administered 2019-12-10: 2 mg via INTRAVENOUS

## 2019-12-10 MED ORDER — MIDAZOLAM HCL 2 MG/2ML IJ SOLN
INTRAMUSCULAR | Status: AC
  Filled 2019-12-10: qty 2

## 2019-12-10 MED ORDER — FENTANYL CITRATE (PF) 100 MCG/2ML IJ SOLN
INTRAMUSCULAR | Status: AC
  Filled 2019-12-10: qty 2

## 2019-12-10 MED ORDER — ONDANSETRON HCL 4 MG/2ML IJ SOLN
INTRAMUSCULAR | Status: AC
  Filled 2019-12-10: qty 2

## 2019-12-10 MED ORDER — ONDANSETRON HCL 4 MG/2ML IJ SOLN
INTRAMUSCULAR | Status: DC | PRN
  Administered 2019-12-10: 4 mg via INTRAVENOUS

## 2019-12-10 MED ORDER — CEFAZOLIN SODIUM-DEXTROSE 2-4 GM/100ML-% IV SOLN
2.0000 g | INTRAVENOUS | Status: AC
  Administered 2019-12-10: 2 g via INTRAVENOUS

## 2019-12-10 MED ORDER — CHLORHEXIDINE GLUCONATE CLOTH 2 % EX PADS
6.0000 | MEDICATED_PAD | Freq: Once | CUTANEOUS | Status: AC
  Administered 2019-12-10: 6 via TOPICAL

## 2019-12-10 MED ORDER — PROPOFOL 500 MG/50ML IV EMUL
INTRAVENOUS | Status: DC | PRN
  Administered 2019-12-10: 100 ug/kg/min via INTRAVENOUS

## 2019-12-10 MED ORDER — OXYCODONE HCL 5 MG PO TABS: 5.0000 mg | ORAL_TABLET | Freq: Once | ORAL | Status: DC | PRN

## 2019-12-10 MED ORDER — DEXAMETHASONE SODIUM PHOSPHATE 10 MG/ML IJ SOLN
INTRAMUSCULAR | Status: AC
  Filled 2019-12-10: qty 1

## 2019-12-10 MED ORDER — FENTANYL CITRATE (PF) 100 MCG/2ML IJ SOLN: 25.0000 ug | INTRAMUSCULAR | Status: DC | PRN

## 2019-12-10 MED ORDER — ACETAMINOPHEN 500 MG PO TABS
ORAL_TABLET | ORAL | Status: AC
  Filled 2019-12-10: qty 2

## 2019-12-10 MED ORDER — PROPOFOL 10 MG/ML IV BOLUS
INTRAVENOUS | Status: DC | PRN
  Administered 2019-12-10: 30 mg via INTRAVENOUS

## 2019-12-10 MED ORDER — PROPOFOL 10 MG/ML IV BOLUS
INTRAVENOUS | Status: AC
  Filled 2019-12-10: qty 20

## 2019-12-10 MED ORDER — KETOROLAC TROMETHAMINE 30 MG/ML IJ SOLN: 30.0000 mg | Freq: Once | INTRAMUSCULAR | Status: DC | PRN

## 2019-12-10 SURGICAL SUPPLY — 31 items
BLADE SURG 15 STRL LF DISP TIS (BLADE) ×1 IMPLANT
BLADE SURG 15 STRL SS (BLADE) ×2
CLOSURE WOUND 1/2 X4 (GAUZE/BANDAGES/DRESSINGS)
COVER BACK TABLE 60X90IN (DRAPES) ×3 IMPLANT
COVER MAYO STAND STRL (DRAPES) ×3 IMPLANT
COVER WAND RF STERILE (DRAPES) ×3 IMPLANT
DECANTER SPIKE VIAL GLASS SM (MISCELLANEOUS) IMPLANT
DRAPE LAPAROTOMY 100X72 PEDS (DRAPES) ×3 IMPLANT
DRAPE UTILITY XL STRL (DRAPES) ×3 IMPLANT
DRSG TEGADERM 2-3/8X2-3/4 SM (GAUZE/BANDAGES/DRESSINGS) ×3 IMPLANT
ELECT REM PT RETURN 9FT ADLT (ELECTROSURGICAL) ×3
ELECTRODE REM PT RTRN 9FT ADLT (ELECTROSURGICAL) ×1 IMPLANT
GAUZE SPONGE 2X2 12PLY NS (GAUZE/BANDAGES/DRESSINGS) ×3 IMPLANT
GAUZE SPONGE 4X4 12PLY STRL LF (GAUZE/BANDAGES/DRESSINGS) IMPLANT
GLOVE BIOGEL PI IND STRL 8 (GLOVE) ×1 IMPLANT
GLOVE BIOGEL PI INDICATOR 8 (GLOVE) ×2
GLOVE ECLIPSE 8.0 STRL XLNG CF (GLOVE) ×3 IMPLANT
GOWN STRL REUS W/ TWL LRG LVL3 (GOWN DISPOSABLE) ×1 IMPLANT
GOWN STRL REUS W/TWL LRG LVL3 (GOWN DISPOSABLE) ×2
KIT TURNOVER CYSTO (KITS) ×3 IMPLANT
NEEDLE HYPO 25X1 1.5 SAFETY (NEEDLE) ×3 IMPLANT
PENCIL SMOKE EVACUATOR (MISCELLANEOUS) ×3 IMPLANT
SET BASIN DAY SURGERY F.S. (CUSTOM PROCEDURE TRAY) ×3 IMPLANT
STRIP CLOSURE SKIN 1/2X4 (GAUZE/BANDAGES/DRESSINGS) IMPLANT
SUT MNCRL AB 4-0 PS2 18 (SUTURE) ×3 IMPLANT
SUT PROLENE 4 0 PS 2 18 (SUTURE) IMPLANT
SUT VIC AB 3-0 SH 27 (SUTURE) ×2
SUT VIC AB 3-0 SH 27X BRD (SUTURE) ×1 IMPLANT
SYR CONTROL 10ML LL (SYRINGE) ×3 IMPLANT
TOWEL OR 17X26 10 PK STRL BLUE (TOWEL DISPOSABLE) ×3 IMPLANT
TRAY DSU PREP LF (CUSTOM PROCEDURE TRAY) ×3 IMPLANT

## 2019-12-10 NOTE — H&P (Signed)
Oscar Mccarty  Documented: 11/03/2019 12:01 PM  Location: [facility]  Patient #: [id number]  DOB: 12-Sep-1965  Married / Language: English / Race: White  Male  History of Present Illness [name] MD; 11/03/2019 12:48 PM)  The patient is a 54 year old male who presents with colorectal cancer. Note for "Colorectal cancer": `  `  `  The patient returns s/p laparoscopic splenic flexure mobilization and left sided colectomy for descending colon cancer 03/11/2019  Patient is feeling better overall. He completed 12 cycles of FOLFOX in late February. Sent to consider port removal. He is due for his staging post adjuvant CT scans May 6. His daughter is getting married on May 15. He is hoping to have it done on May 19 or 20th. He otherwise feels well. Appetite good. No fevers or chills.  Pathology T4 N1.  1. 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 Microsatellite stable, no loss of mismatch repair protein expression  ? CTs 12/24/2018- sigmoid: Wall thickening, multiple enlarged nodes adjacent to the sigmoid colon, no evidence of metastatic disease  ? Cycle 1 FOLFOX 04/14/2019  ? Cycle 2 FOLFOX 04/30/2019, Udenyca added  ? Cycle 3 FOLFOX 05/14/2019 (oxaliplatin dose reduced to 65 mg/m due to thrombocytopenia)  ? Cycle 4 FOLFOX 05/27/2019 (continue oxaliplatin dose reduction to 29m/m2, PLT 129K)  ? Cycle 5 FOLFOX 06/10/2019  ? Cycle 6 FOLFOX 06/24/2019  ? Cycle 7 FOLFOX 07/08/2019  ? Cycle 8 FOLFOX 07/22/2019  ? Cycle 9 FOLFOX 08/05/2019  ? Cycle 10 FOLFOX 08/19/2019  ? Cycle 11 FOLFOX 09/02/2019  ? Cycle 12 FOLFOX 09/16/2019  `  Pathology:  Diagnosis 1. Colon, segmental resection for tumor, sigmoid colon and descending colon open end proximal - ADENOCARCINOMA, MODERATELY DIFFERENTIATED, 4.0 CM - CARCINOMA FOCALLY INVOLVES SEROSA - ONE TUMOR DEPOSIT - LYMPHOVASCULAR SPACE INVASION PRESENT - NO CARCINOMA IDENTIFIED IN THIRTEEN LYMPH NODES  (0/13) - SEE ONCOLOGY TABLE AND COMMENT BELOW 2. Colon, resection margin (donut), final distal margin - BENIGN COLON - NO MALIGNANCY IDENTIFIED Microscopic Comment 1. COLON AND RECTUM: Resection, Including Transanal Disk Excision of Rectal Neoplasms Procedure: Left hemicolectomy Tumor Site: Sigmoid Tumor Size: 4.0 cm Macroscopic Tumor Perforation: Not identified Histologic Type: Adenocarcinoma Histologic Grade: G2 (Moderately differentiated) Tumor Extension: Tumor invades the visceral peritoneum Margins: All margins uninvolved by invasive carcinoma, high grade dysplasia/intramucosal carcinoma, and low grade dysplasia Treatment effect: No known presurgical therapy Lymphovascular Invasion: Present Perineural Invasion: Not identified Tumor Deposits: Present, one Regional Lymph Nodes: Number of Lymph Nodes Involved: 13 Number of Lymph Nodes Examined: 0 Pathologic Stage Classification (pTNM, AJCC 8th Edition): pT4a, pN1c Ancillary Studies: Pending 1 of 3 FINAL for Helwig, Yiannis (([id number] Microscopic Comment(continued) Representative tumor block: 1C Comments: Dr. KVic Ripperreviewed the case and agrees with the above diagnosis. (v4.0.1.0) DThressa ShellerMD Pathologist, Electronic Signature (Case signed 03/16/2019) Specimen Addy Mcmannis and Clinical Information Specimen(s) Obtained: 1. Colon, segmental resection for tumor, sigmoid colon and descending colon open end proximal 2. Colon, resection margin (donut), final distal margin Specimen Clinical Information 1. colon cancer (kp) Isabellarose Kope 1. Specimen: Descending colon and sigmoid, open end is proximal. Specimen integrity: Intact. Specimen length: 28 cm.  Mesorectal intactness: Not applicable. Tumor size: 4 cm in length, 4 cm in width, tan-red firm sessile mass with rolled edges. Percent of bowel circumference involved: 100%. Tumor distance to margins: Proximal: 7 cm. Distal: 17 cm. Mesenteric (sigmoid and transverse): 5 cm. Macroscopic extent of tumor invasion:  On sectioning, the  tumor mass is up to 1.6 cm thick, involving the entire thickness of the wall and into underlying fat. The tumor also abuts the serosa, with focal possible involvement of serosa. Total presumed lymph nodes: Found are fourteen possible lymph nodes ranging from 0.3 to 2.1 cm. The largest has gray-white to yellow-red firm to friable cut surfaces. Extramural satellite tumor nodules: None. Mucosal polyp(s): None. Additional findings: None. Block summary: Thirteen blocks A = proximal margin B = distal margin C = deepest tumor invasion into fat D-E = tumor mass with possible serosal involvement. F = tumor and adjacent mucosa G = tissue for molecular testing H = four nodes I = three nodes J = three nodes K = three nodes L-M = largest node. 2. In formalin is a 1.4 cm in length and 2.2 cm in diameter ring of tan-pink smooth to wrinkled mucosa with underlying embedded metallic staples in the wall. Representative sections in one block. (SSW:ah 03/13/19) 2 of 3 FINAL for Winchell, Niles ([id number]) Disclaimer Some of these immunohistochemical stains may have been developed and the performance characteristics determined by [facility]. Some may not have been cleared or approved by the U.S. Food and Drug Administration. The FDA has determined that such clearance or approval is not necessary. This test is used for clinical purposes. It should not be regarded as investigational or for research. This laboratory is certified under the [place] (CLIA-88) as qualified to perform high complexity clinical laboratory testing. Report signed out from the following location(s) Technical Component was performed at [facility] [place], [place], [place] [address]. CLIA #: [id number], Interpretation was performed at [place] [address], [place], [place] [address]. CLIA #: [id number], 3 of 3  Result Notes for Surgical pathology  Notes recorded by  Michael Boston, MD on 03/16/2019 at 12:11 PM EDT Patient s/p surgery for Descending colon cancer 03/11/2019 pT4a, pN1c Alisha, CCS CMA: 1. Please set the patient up for GI Tumor Board. Patient Care Team: Enid Skeens., MD as PCP - General (Family Medicine) Ladell Pier, MD as Consulting Physician (Oncology) Jackquline Denmark, MD as Consulting Physician (Gastroenterology) Michael Boston, MD as Consulting Physician (General Surgery) 2. Please set up consult(s): Dr Benay Spice Diagnosis 1. Colon, segmental resection for tumor, sigmoid colon and descending colon open end proximal - ADENOCARCINOMA, MODERATELY DIFFERENTIATED, 4.0 CM - CARCINOMA FOCALLY INVOLVES SEROSA - ONE TUMOR DEPOSIT - LYMPHOVASCULAR SPACE INVASION PRESENT - NO CARCINOMA IDENTIFIED IN THIRTEEN LYMPH NODES (0/13) - SEE ONCOLOGY TABLE AND COMMENT BELOW 2. Colon, resection margin (donut), final distal margin - BENIGN COLON - NO MALIGNANCY IDENTIFIED Microscopic Comment 1. COLON AND RECTUM: Resection, Including Transanal Disk Excision of Rectal Neoplasms Procedure: Left hemicolectomy Tumor Site: Sigmoid Tumor Size: 4.0 cm Macroscopic Tumor Perforation: Not identified Histologic Type: Adenocarcinoma Histologic Grade: G2 (Moderately differentiated) Tumor Extension: Tumor invades the visceral peritoneum Margins: All margins uninvolved by invasive carcinoma, high grade dysplasia/intramucosal carcinoma, and low grade dysplasia Treatment effect: No known presurgical therapy Lymphovascular Invasion: Present Perineural Invasion: Not identified Tumor Deposits: Present, one Regional Lymph Nodes: Number of Lymph Nodes Involved: 13 Number of Lymph Nodes Examined: 0 Pathologic Stage Classification (pTNM, AJCC 8th Edition): pT4a, pN1c Ancillary Studies: Pending 1 of 3 FINAL for Meter, Cordale ([id number]) Microscopic Comment(continued) Representative tumor block: 1C Comments: Dr. Vic Ripper reviewed the case and agrees with the above diagnosis. (v4.0.1.0) Thressa Sheller MD  Pathologist, Electronic Signature (Case signed 03/16/2019)  Specimen Avett Reineck and Clinical Information Specimen(s) Obtained: 1. Colon, segmental resection for tumor, sigmoid colon and descending colon open end proximal 2. Colon, resection margin (donut), final distal margin Specimen Clinical Information 1. colon cancer (kp) Delynn Olvera 1. Specimen: Descending colon and sigmoid, open end is proximal. Specimen integrity: Intact. Specimen length: 28 cm. Mesorectal intactness: Not applicable. Tumor size: 4 cm in length, 4 cm in width, tan-red firm sessile mass with rolled edges. Percent of bowel circumference involved: 100%. Tumor distance to margins: Proximal: 7 cm. Distal: 17 cm. Mesenteric (sigmoid and transverse): 5 cm. Macroscopic extent of tumor invasion: On sectioning, the tumor mass is up to 1.6 cm thick, involving the entire thickness of the wall and into underlying fat. The tumor also abuts the serosa, with focal possible involvement of serosa. Total presumed lymph nodes: Found are fourteen possible lymph nodes ranging from 0.3 to 2.1 cm. The largest has gray-white to yellow-red firm to friable cut surfaces. Extramural satellite tumor nodules: None. Mucosal polyp(s): None. Additional findings: None. Block summary: Thirteen blocks A = proximal margin B = distal margin C = deepest tumor invasion into fat D-E = tumor mass with possible serosal involvement. F = tumor and adjacent mucosa G = tissue for molecular testing H = four nodes I = three nodes J = three nodes K = three nodes L-M = largest node. 2. In formalin is a 1.4 cm in length and 2.2 cm in diameter ring of tan-pink smooth to wrinkled mucosa with underlying embedded metallic staples in the wall. Representative sections in one block. (SSW:ah 03/13/19) 2 of 3 FINAL for Whitaker, Printice ([id number]) Disclaimer Some of these immunohistochemical stains may have been developed and the performance characteristics determined by [facility]. Some may not have been  cleared or approved by the U.S. Food and Drug Administration. The FDA has determined that such clearance or approval is not necessary. This test is used for clinical purposes. It should not be regarded as investigational or for research. This laboratory is certified under the [place] (CLIA-88) as qualified to perform high complexity clinical laboratory testing. Report signed out from the following location(s) Technical Component was performed at [facility] [place], [place], [place] [address]. CLIA #: [id number], Interpretation was performed at [place], [place], [place] [address]. CLIA #: [id number], 3 of 3  `  `  `  03/11/2019  5:12 PM  PATIENT: Oscar Mccarty 54 y.o. male  Patient Care Team:  Enid Skeens., MD as PCP - General (Family Medicine)  Ladell Pier, MD as Consulting Physician (Oncology)  Jackquline Denmark, MD as Consulting Physician (Gastroenterology)  Michael Boston, MD as Consulting Physician (General Surgery)  PRE-OPERATIVE DIAGNOSIS: SIGMOID COLON CANCER  POST-OPERATIVE DIAGNOSIS: DISTAL DESCENDING COLON CANCER  PROCEDURE:  XI ROBOTIC ASSISTED PARTIAL COLECTOMY (DESCENDING & SIGMOID COLON)  MOBILIZATION OF SPLENIC FLEXURE  BILATERAL TAP BLOCK  RIGID PROCTOSCOPY  SURGEON: [name], MD  ASSISTANT: Carlena Hurl, PA-C  ANESTHESIA: local and general  EBL: Total I/O  In: 79 [I.V.:3000]  Out: 200 [Urine:150; Blood:50]  Delay start of Pharmacological VTE agent (>24hrs) due to surgical blood loss or risk of bleeding: no  DRAINS: No  SPECIMEN: Descending and sigmoid colon. Open end proximal. Strongest tattooed near the obvious cancer  DISPOSITION OF SPECIMEN: PATHOLOGY  COUNTS: YES  PLAN OF CARE: Admit to inpatient  PATIENT DISPOSITION: PACU - hemodynamically stable.  INDICATION:  Pleasant gentleman developed worsening  rectal bleeding. Found to have bulky tumor in upper  sigmoid colon. Surgery recommended. He ended up developing COVID pneumonitis requiring hospitalization in June. He is recovered and is now COVID negative. Because of his bleeding surgery moved up to this month. I recommended segmental resection:  The anatomy & physiology of the digestive tract was discussed. The pathophysiology was discussed. Natural history risks without surgery was discussed. I worked to give an overview of the disease and the frequent need to have multispecialty involvement. I feel the risks of no intervention will lead to serious problems that outweigh the operative risks; therefore, I recommended a partial colectomy to remove the pathology. Laparoscopic & open techniques were discussed.  Risks such as bleeding, infection, abscess, leak, reoperation, possible ostomy, hernia, heart attack, death, and other risks were discussed. I noted a good likelihood this will help address the problem. Goals of post-operative recovery were discussed as well. We will work to minimize complications. Educational materials on the pathology had been given in the office. Questions were answered.  The patient expressed understanding & wished to proceed with surgery.  OR FINDINGS:  Patient had redundant descending and sigmoid colon with bulky tumor at the distal descending colon. Left conization done. Splenic flexure mobilization done to have tension-free reaching to the pelvis.  No obvious metastatic disease on visceral parietal peritoneum or liver.  The anastomosis rests 14 cm from the anal verge by rigid proctoscopy.  DESCRIPTION:  Informed consent was confirmed. The patient underwent general anaesthesia without difficulty. The patient was positioned appropriately. VTE prevention in place. The patient was clipped, prepped, & draped in a sterile fashion. Surgical timeout confirmed our plan.  The patient was positioned in reverse Trendelenburg. Abdominal entry was gained using Varess technique at the left  subcostal ridge on the anterior abdominal wall. No elevated EtCO2 noted. Port placed. Camera inspection revealed no injury. Extra ports were carefully placed under direct laparoscopic visualization. Could see strongest tattoo at the distal descending colon. Sigmoid rather redundant and corkscrewed upon itself. I reflected the greater omentum and the upper abdomen the small bowel in the upper abdomen. The patient was carefully positioned. The Intuitive daVinci robot was docked with camera & instruments carefully placed.  I scored the base of peritoneum of the medial side of the mesentery of the elevated left colon from the ligament of Treitz to the peritoneal reflection of the mid rectum. I elevated the sigmoid mesentery and entered into the retro-mesenteric plane. We were able to identify the left ureter and gonadal vessels. We kept those posterior within the retroperitoneum and elevated the left colon mesentery off that. I did isolate the inferior mesenteric artery (IMA) pedicle but did not ligate it yet. I continued distally and got into the avascular plane posterior to the mesorectum. His tissues were somewhat fibrotic in areas and very stretched out/rubbery and others. Eventually I was able to mobilize the rectum as well by freeing the mesorectum off the sacrum. Identified the nervi ergentes days and kept them in the natural retroperitoneal midline position in its usual wishbone pattern. I mobilized the peritoneal coverings towards the peritoneal reflection on both the right and left sides down to the junction between the proximal and mid rectum. I stayed away from the right and left ureters. I kept the lateral vascular pedicles to the rectum intact.  I was able to come between the window between the inferior mesenteric vein and artery. Free off the mid and proximal colon off the kidney. Identify and preserve the  ureter and gonadal vessels and keep them in the natural retroperitoneal position. I skeletonized  the lymph nodes off the inferior mesenteric artery pedicle. I went down to its takeoff from the aorta. I isolated the inferior mesenteric vein off of the ligament of Treitz just cephalad to that as well. After confirming the left ureter was out of the way, I went ahead and ligated the inferior mesenteric artery pedicle just near its takeoff from the aorta. I did ligate the inferior mesenteric vein in a similar fashion. We ensured hemostasis. I skeletonized the mesorectum at the junction at the proximal rectum for the distal point of resection. I mobilized the left colon in a lateral to medial fashion off the line of Toldt up towards the splenic flexure to ensure good mobilization of the remaining left colon to reach into the pelvis. Unfortunately he did not seem to wish to mobilize well.  Therefore I decided to do more formal splenic flexure mobilization. I reposition the patient in reversal [place]. Transected the greater omentum off the mid transverse colon and follow that distally. Was able to get the lesser sac and free the great omentum towards the splenic flexure. I could see his pancreas and I was able to get a window between the inferior pancreatic edge and the retroperitoneum to connect with my prior retroperitoneal dissection. Without I transected the retroperitoneal attachments of the distal transverse colon and splenic flexure off the retroperitoneum especially to the kidney. Did that towards the midline. With that I got excellent mobilization of the entire left colon. That released the kinked up splenic flexure straighten it out and it came down much more easily off tension. Repositioned the patient in Trendelenburg.  I skeletonized at the proximal mesorectum and transected at the proximal rectum using a robotic 45 mm stapler. I chose a region at the proximal descending colon close to the splenic flexure that was soft and easily reached down to the rectal stump. I transected the mesentery of the  colon radially to preserve remaining colon blood supply, ensuring that the inferior mesenteric vein and arterial pedicles were with the specimen. Preserving a good marginal artery.  I created an extraction incision through a small Pfannenstiel incision in the suprapubic region. Placed a wound protector. I was able to eviscerate the rectosigmoid and descending colon out the wound. I clamped the colon proximal to this area using a reusable pursestringer device. Passed a 2-0 Keith needle. I transected at the descending/sigmoid junction with a scalpel. I got healthy bleeding mucosa. We sent the rectosigmoid colon specimen off to go to pathology. We sized the colon orifice. I chose a 31 EEA anvil stapler system. I reinforced the prolene pursestring with interrupted silk suture. I placed the anvil to the open end of the proximal remaining colon and closed around it using the pursestring. We did copious irrigation with crystalloid solution. Hemostasis was good. The distal end of the remaining colon easily reached down to the rectal stump, therefore, further colone mobilization was not needed.  Ms Lemar Lofty scrubbed down and did gentle anal dilation and advanced the EEA stapler up the rectal stump. The spike was brought out at the provimal end of the rectal stump under direct visualization. I attached the anvil of the proximal colon the spike of the stapler. Anvil was tightened down and held clamped for 60 seconds. The EEA stapler was fired and held clamped for 30 seconds. The stapler was released & removed. We noted 2 excellent anastomotic rings. Blue stitch is in  the proximal ring. Ms Lemar Lofty We did a final irrigation of antibiotic solution (900 mg clindamycin/240 mg gentamicin in a liter of crystalloid) & held that for the pelvic air leak test . The rectum was insufflated the rectum while clamping the colon proximal to that anastomosis. There was a negative air leak test. There was no tension of mesentery or bowel at the  anastomosis. Tissues looked viable. Ureters & bowel uninjured. The anastomosis looked healthy.  Endoluminal gas was evacuated. Ports & wound protector removed. I did rigid proctoscopy noted the anastomosis was at 14 cm from the anal verge consistent with the proximal rectum. We changed gloves & redraped the patient per colon SSI prevention protocol. We aspirated the antibiotic irrigation. Hemostasis was good. Sterile unused instruments were used from this point. I closed the skin at the port sites using Monocryl stitch and sterile dressing. I closed the extraction wound using a 0 Vicryl vertical peritoneal closure and a #1 PDS transverse anterior rectal fascial closure like a small Pfannenstiel closure. I closed the skin at the port sites and Pfannenstiel incision with 4-0 Monocryl stitches.  Patient is being extubated go to recovery room. I had discussed postop care with the patient in detail the office & in the holding area. Instructions are written. I discussed operative findings, updated the patient's status, discussed probable steps to recovery, and gave postoperative recommendations to the patient's significant other. Myrtie Soman. Recommendations were made. Questions were answered. She expressed understanding & appreciation.  [name], M.D., F.A.C.S.  Gastrointestinal and Minimally Invasive Surgery  [facility]  [address], [place], [place] [address]  [phone] Main / Paging  Allergies Sabino Gasser, CMA; 11/03/2019 12:01 PM)  No Known Drug Allergies [02/02/2019]: (Marked as Inactive)  No Known Allergies [02/02/2019]: (Marked as Inactive)  Allergies Reconciled  Medication History Sabino Gasser, CMA; 11/03/2019 12:01 PM)  traMADol HCl (50MG Tablet, 1-2 Oral every six hours, as needed, Taken starting 04/01/2019) Active.  Neomycin Sulfate (500MG Tablet, 2 (two) Oral SEE NOTE, Taken starting 02/02/2019) Active. (TAKE TWO TABLETS AT 2 PM, 3 PM, AND 10  PM THE DAY PRIOR TO SURGERY)  Flagyl (500MG Tablet, 2 (two) Oral SEE NOTE, Taken starting 02/02/2019) Active. (Take at 2pm, 3pm, and 10pm the day prior to your colon operation)  Ondansetron (8MG Tablet Disint, Oral) Active.  glipiZIDE (5MG Tablet, Oral) Active.  Ibuprofen (400MG Tablet, Oral) Active.  midazolam Active.  metFORMIN HCl (500MG Tablet, Oral) Active.  Medications Reconciled  Vitals Sabino Gasser CMA; 11/03/2019 12:02 PM)  11/03/2019 12:01 PM  Weight: 166.6 lb Height: 67 in  Body Surface Area: 1.87 m Body Mass Index: 26.09 kg/m  Temp.: 97.7 F (Tympanic) Pulse: 96 (Regular)  BP: 112/72 (Sitting, Left Arm, Standard)  Physical Exam [name] MD; 11/03/2019 12:47 PM)  General  Mental Status - Alert.  General Appearance - Not in acute distress.  Voice - Normal.  Note: Relaxed. Nontoxic.  Integumentary  Global Assessment  Upon inspection and palpation of skin surfaces of the - Distribution of scalp and body hair is normal.  General Characteristics  Overall examination of the patient's skin reveals - no rashes and no suspicious lesions.  Head and Neck  Head - normocephalic, atraumatic with no lesions or palpable masses.  Face  Global Assessment - atraumatic, no absence of expression.  Neck  Global Assessment - no abnormal movements, no decreased range of motion.  Trachea - midline.  Thyroid  Gland Characteristics -  non-tender.  Eye  Eyeball - Left - Extraocular movements intact, No Nystagmus - Left.  Eyeball - Right - Extraocular movements intact, No Nystagmus - Right.  Upper Eyelid - Left - No Cyanotic - Left.  Upper Eyelid - Right - No Cyanotic - Right.  Chest and Lung Exam  Inspection  Accessory muscles - No use of accessory muscles in breathing.  Note: Chest soft. Port at infraclavicular space. Mild dimpling on his thin dermis but no erosion.  Abdomen  Note: Incisions with normal healing ridges. No active bleeding. No cellulitis. No guarding/rebound  tenderness  Peripheral Vascular  Upper Extremity  Inspection - Left - Not Gangrenous, No Petechiae. Inspection - Right - Not Gangrenous, No Petechiae.  Neurologic  Neurologic evaluation reveals - normal attention span and ability to concentrate, able to name objects and repeat phrases. Appropriate fund of knowledge and normal coordination.  Neuropsychiatric  Mental status exam performed with findings of - able to articulate well with normal speech/language, rate, volume and coherence and no evidence of hallucinations, delusions, obsessions or homicidal/suicidal ideation.  Orientation - oriented X3.  Musculoskeletal  Global Assessment  Gait and Station - normal gait and station.  Lymphatic  General Lymphatics  Description - No Generalized lymphadenopathy.  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  Completed post-adjuvant chemotherapy. As long as there are no surprises on his CAT scans on May 6, can discuss removal. He would like to wait until after his daughter's wedding on 15 May. Therefore the following week. He specifically was hoping 68 or 20th of May at [place] long. We can do that or the [facility]. I think it is reasonable go back to light duty work within a couple days and then unrestricted about 2 weeks later.  Current Plans  I recommended surgery to remove the catheter. I explained the technique of removal with use of local anesthesia & possible need for more aggressive sedation/anesthesia for patient comfort.  Risks such as bleeding, infection, and other risks were discussed. Post-operative dressing/incision care was discussed. I noted a good likelihood this will help address the problem. We will work to minimize complications. Questions were answered. The patient expresses  understanding & wishes to proceed with surgery.  You are being scheduled for surgery - Our schedulers will call you.  You should hear from our office's scheduling department within 5 working days about the location, date, and time of surgery. We try to make accommodations for patient's preferences in scheduling surgery, but sometimes the OR schedule or the surgeon's schedule prevents [place] from making those accommodations.  If you have not heard from our office [phone]) in 5 working days, call the office and ask for your surgeon's nurse.  If you have other questions about your diagnosis, plan, or surgery, call the office and ask for your surgeon's nurse.  HX CANCER OF COLORECTAL REGION - F/U PRN (Z85.048)  Current Plans  Return to clinic as needed.  Soreness, decreased appetite, and poor energy level are common problems after surgery. While many people can struggle with a bad day, these concerns should gradually fade away or at least improve. Much of your recovery depends on your health & the severity of your operation. Please call if you have any further questions / concerns related to surgery.  Increase activity as tolerated to regular everyday  activity. Consider daily low impact exercise every day such as walking an hour a day.  Do not push through pain. If it hurts to do it, then don't do it.  Diet as tolerated. Low fat high fiber diet ideal. 30 g fiber a day ideal. Consider taking a daily fiber supplement to keep your bowels regular.  Followup with your primary care physician for other health issues as would normally be done.  Consider screening for malignancies (breast, prostate, colon, melanoma, etc) as appropriate. Discuss with you primary care physician.  Consider follow up colonoscopy by your gastroenterologist.  Since you had a colorectal cancer resected by surgery, you should strongly consider getting a colonoscopy by your gastroenterologict in one year after the surgery that removed  your cancer. Call your gastroenterologist for advice  Pt Education - CCS Colorectal Cancer (AT): discussed with patient and provided information. [name], MD, FACS, MASCRS  Gastrointestinal and Minimally Invasive Surgery  [facility]  [address] [place]  [place], [place] [address]  [phone] Main / Paging  [phone] Fax      I recommended surgery to remove the catheter.  I explained the technique of removal with use of local anesthesia & possible need for more aggressive sedation/anesthesia for patient comfort.    Risks such as bleeding, infection, injury to other organs, need for repair of tissues / organs, need for further treatment, and other risks were discussed.  Post-operative dressing/incision care was discussed.  I noted a good likelihood this will help address the problem.   We will work to minimize complications. Questions were answered.  The patient expresses understanding & wishes to proceed with surgery.     I have re-reviewed the the patient's records, history, medications, and allergies.  I have re-examined the patient.  I again discussed intraoperative plans and goals of post-operative recovery.  The patient agrees to proceed.  Rushton Early  [birth year] [id number]  Patient Care Team: Enid Skeens., MD as PCP - General (Family Medicine) Ladell Pier, MD as Consulting Physician (Oncology) Jackquline Denmark, MD as Consulting Physician (Gastroenterology) Michael Boston, MD as Consulting Physician (General Surgery)  Patient Active Problem List   Diagnosis Date Noted  . History of COVID-19 respiratory pneumonitis June 2020 03/11/2019    Priority: High  . Port-A-Cath in place 04/13/2019  . Uses Spanish as primary spoken language 04/09/2019  . Acute respiratory disease due to COVID-19 virus - June 2020 01/05/2019  . Diabetes mellitus, type 2 ([place]) 12/25/2018  . 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 Bronchitis  09/17/2011  . Allergic rhinitis 12/12/2010  . Chronic headaches 12/12/2010  . Depression 12/12/2010  . Cough 12/12/2010    Past Medical History:  Diagnosis Date  . Adenocarcinoma of sigmoid colon ([place]) 12/22/2018   Malignant partially obstructing tumor in the proximal sigmoid colon  . Allergic rhinitis   . Depression   . Diabetes mellitus, type 2 ([place]) 12/25/2018   new diagnosis, A1C 9.2% 12/25/2018  . Displaced fracture of distal phalanx of left middle finger 04/03/2016  . GERD (gastroesophageal reflux disease)   . History of COVID-19 respiratory pneumonitis June 2020 12/2018   heache, n/v fever, diarrhea symptoms resolved after quarantine and in hospital x 7 days with covid  . 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 Migraines     Past Surgical History:  Procedure Laterality Date  . COLONOSCOPY  12-19-2018  dr Lyndel Safe  .  PORTACATH PLACEMENT N/A 04/09/2019   Procedure: PLACEMENT OF PORT-A-CATHETER UNDER ULTRASOUND AND FLUOROSCOPIC GUIDANCE;  Surgeon: Michael Boston, MD;  Location: [facility];  Service: General;  Laterality: N/A;  . PROCTOSCOPY N/A 03/11/2019   Procedure: RIGID PROCTOSCOPY;  Surgeon: Michael Boston, MD;  Location: [facility];  Service: General;  Laterality: N/A;  . XI ROBOTIC ASSISTED COLOSTOMY TAKEDOWN N/A 03/11/2019   Procedure: XI ROBOTIC ASSISTED LEFT COLECTOMY, MOBILIZATION OF SPLENIC FLEXURE, BILATERAL TAP BLOCK;  Surgeon: Michael Boston, MD;  Location: [facility];  Service: General;  Laterality: N/A;    Social History   Socioeconomic History  . Marital status: Married    Spouse name: Not on file  . Number of children: Not on file  . Years of education: Not on file  . Highest education level: Not on file  Occupational History  . Not on file  Tobacco Use  . Smoking status: Never Smoker  . Smokeless tobacco: Never Used  Substance and Sexual Activity  . Alcohol use: Not Currently  . Drug use: No  . Sexual activity: Not on file   Other Topics Concern  . Not on file  Social History Narrative  . Not on file   Social Determinants of Health   Financial Resource Strain:   . Difficulty of Paying Living Expenses:   Food Insecurity:   . Worried About Charity fundraiser in the Last Year:   . Arboriculturist in the Last Year:   Transportation Needs:   . Film/video editor (Medical):   Marland Kitchen Lack of Transportation (Non-Medical):   Physical Activity:   . Days of Exercise per Week:   . Minutes of Exercise per Session:   Stress:   . Feeling of Stress :   Social Connections:   . Frequency of Communication with Friends and Family:   . Frequency of Social Gatherings with Friends and Family:   . Attends Religious Services:   . Active Member of Clubs or Organizations:   . Attends Archivist Meetings:   Marland Kitchen Marital Status:   Intimate Partner Violence:   . Fear of Current or Ex-Partner:   . Emotionally Abused:   Marland Kitchen Physically Abused:   . Sexually Abused:     Family History  Problem Relation Age of Onset  . Colon cancer Neg Hx   . Esophageal cancer Neg Hx   . Rectal cancer Neg Hx   . Stomach cancer Neg Hx     Medications Prior to Admission  Medication Sig Dispense Refill Last Dose  . atorvastatin (LIPITOR) 10 MG tablet Take 10 mg by mouth every evening.    12/09/2019 at Unknown time  . ibuprofen (ADVIL) 200 MG tablet Take 400 mg by mouth every 6 (six) hours as needed for headache or moderate pain.   Past Month at Unknown time  . lidocaine-prilocaine (EMLA) cream Apply 1 application topically as needed. 30 g 0 Past Month at Unknown time  . metFORMIN (GLUCOPHAGE) 500 MG tablet Take 1 tablet (500 mg total) by mouth 2 (two) times daily with a meal. (Patient taking differently: Take 1,000 mg by mouth 2 (two) times daily with a meal. ) 60 tablet 11 12/09/2019 at Unknown time    Current Facility-Administered Medications  Medication Dose Route Frequency Provider Last Rate Last Admin  . ceFAZolin (ANCEF) IVPB  2g/100 mL premix  2 g Intravenous On Call to OR Michael Boston, MD      . lactated ringers infusion   Intravenous Continuous Royce Macadamia,  Legrand Como, MD 50 mL/hr at 12/10/19 0608 New Bag at 12/10/19 0608     No Known Allergies  BP 118/74   Pulse (!) 57   Temp 98 F (36.7 C) (Oral)   Resp 15   Ht _0  (1.702 m)   Wt 74.4 kg   SpO2 100%   BMI 25.69 kg/m   Labs: Results for orders placed or performed during the hospital encounter of 12/10/19 (from the past 48 hour(s))  Glucose, capillary     Status: None   Collection Time: 12/10/19  6:06 AM  Result Value Ref Range   Glucose-Capillary 87 70 - 99 mg/dL    Comment: Glucose reference range applies only to samples taken after fasting for at least 8 hours.    Imaging / Studies: CT Chest W Contrast  Result Date: 11/26/2019 CLINICAL DATA:  Sigmoid colon cancer diagnosed May 2020, surgically treated and August 2020. Chemotherapy, radiation therapy, and immunotherapy completed. EXAM: CT CHEST, ABDOMEN, AND PELVIS WITH CONTRAST TECHNIQUE: Multidetector CT imaging of the chest, abdomen and pelvis was performed following the standard protocol during bolus administration of intravenous contrast. CONTRAST:  172m OMNIPAQUE IOHEXOL 300 MG/ML  SOLN COMPARISON:  01/20/2019 FINDINGS: CT CHEST FINDINGS Cardiovascular: Right Port-A-Cath tip: Cavoatrial junction. Mild atherosclerotic calcification in the aortic arch. Mediastinum/Nodes: Unremarkable Lungs/Pleura: Biapical pleuroparenchymal scarring. Old granulomatous disease. 3 by 4 mm left upper lobe nodule on image 88/6 is chronically stable. Subsegmental atelectasis both lower lobes. Musculoskeletal: Unremarkable CT ABDOMEN PELVIS FINDINGS Hepatobiliary: Unremarkable Pancreas: Unremarkable Spleen: Unremarkable Adrenals/Urinary Tract: Unremarkable Stomach/Bowel: Partial colectomy. Staple line appears normal in the pelvis, image 106/2. There is some frothy fluid in the distal colon and rectum, diarrheal process  suspected. Vascular/Lymphatic: Aortoiliac atherosclerotic vascular disease. No pathologic adenopathy observed. Reproductive: Unremarkable Other: No supplemental non-categorized findings. Musculoskeletal: Lumbar spondylosis and degenerative disc disease causing impingement at L4-5 and L5-S1. IMPRESSION: 1. No findings of recurrent malignancy. 2. Other imaging findings of potential clinical significance: Old granulomatous disease. Lumbar spondylosis and degenerative disc disease causing impingement at L4-5 and L5-S1. 3. Aortic atherosclerosis. Aortic Atherosclerosis (ICD10-I70.0). Electronically Signed   By: WVan ClinesM.D.   On: 11/26/2019 13:36   CT Abdomen Pelvis W Contrast  Result Date: 11/26/2019 CLINICAL DATA:  Sigmoid colon cancer diagnosed May 2020, surgically treated and August 2020. Chemotherapy, radiation therapy, and immunotherapy completed. EXAM: CT CHEST, ABDOMEN, AND PELVIS WITH CONTRAST TECHNIQUE: Multidetector CT imaging of the chest, abdomen and pelvis was performed following the standard protocol during bolus administration of intravenous contrast. CONTRAST:  1068mOMNIPAQUE IOHEXOL 300 MG/ML  SOLN COMPARISON:  01/20/2019 FINDINGS: CT CHEST FINDINGS Cardiovascular: Right Port-A-Cath tip: Cavoatrial junction. Mild atherosclerotic calcification in the aortic arch. Mediastinum/Nodes: Unremarkable Lungs/Pleura: Biapical pleuroparenchymal scarring. Old granulomatous disease. 3 by 4 mm left upper lobe nodule on image 88/6 is chronically stable. Subsegmental atelectasis both lower lobes. Musculoskeletal: Unremarkable CT ABDOMEN PELVIS FINDINGS Hepatobiliary: Unremarkable Pancreas: Unremarkable Spleen: Unremarkable Adrenals/Urinary Tract: Unremarkable Stomach/Bowel: Partial colectomy. Staple line appears normal in the pelvis, image 106/2. There is some frothy fluid in the distal colon and rectum, diarrheal process suspected. Vascular/Lymphatic: Aortoiliac atherosclerotic vascular disease. No  pathologic adenopathy observed. Reproductive: Unremarkable Other: No supplemental non-categorized findings. Musculoskeletal: Lumbar spondylosis and degenerative disc disease causing impingement at L4-5 and L5-S1. IMPRESSION: 1. No findings of recurrent malignancy. 2. Other imaging findings of potential clinical significance: Old granulomatous disease. Lumbar spondylosis and degenerative disc disease causing impingement at L4-5 and L5-S1. 3. Aortic atherosclerosis. Aortic Atherosclerosis (ICD10-I70.0). Electronically Signed  By: Van Clines M.D.   On: 11/26/2019 13:36     .[name], M.D., F.A.C.S. Gastrointestinal and Minimally Invasive Surgery [facility] [address], [place], [place] [address] [phone] Main / Paging  12/10/2019 7:18 AM

## 2019-12-10 NOTE — Interval H&P Note (Signed)
History and Physical Interval Note:  12/10/2019 7:18 AM  Oscar Mccarty  has presented today for surgery, with the diagnosis of colon cancer.  The various methods of treatment have been discussed with the patient and family. After consideration of risks, benefits and other options for treatment, the patient has consented to  Procedure(s) with comments: REMOVAL PORT-A-CATH (N/A) - mac and local as a surgical intervention.  The patient's history has been reviewed, patient examined, no change in status, stable for surgery.  I have reviewed the patient's chart and labs.  Questions were answered to the patient's satisfaction.     Oscar Mccarty   I have re-reviewed the the patient's records, history, medications, and allergies.  I have re-examined the patient.  I again discussed intraoperative plans and goals of post-operative recovery.  The patient agrees to proceed.  Oscar Mccarty  09-Mar-1966 [id number]  Patient Care Team: Enid Skeens., MD as PCP - General (Family Medicine) Ladell Pier, MD as Consulting Physician (Oncology) Jackquline Denmark, MD as Consulting Physician (Gastroenterology) Michael Boston, MD as Consulting Physician (General Surgery)  Patient Active Problem List   Diagnosis Date Noted   History of COVID-19 respiratory pneumonitis June 2020 03/11/2019    Priority: High   Port-A-Cath in place 04/13/2019   Uses Spanish as primary spoken language 04/09/2019   Acute respiratory disease due to COVID-19 virus - June 2020 01/05/2019   Diabetes mellitus, type 2 ([place]) 12/25/2018   Adenocarcinoma of sigmoid colon ([place]) 12/22/2018   Bronchitis 09/17/2011   Allergic rhinitis 12/12/2010   Chronic headaches 12/12/2010   Depression 12/12/2010   Cough 12/12/2010    Past Medical History:  Diagnosis Date   Adenocarcinoma of sigmoid colon ([place]) 12/22/2018   Malignant partially obstructing tumor in the proximal sigmoid colon   Allergic rhinitis    Depression    Diabetes mellitus, type  2 ([place]) 12/25/2018   new diagnosis, A1C 9.2% 12/25/2018   Displaced fracture of distal phalanx of left middle finger 04/03/2016   GERD (gastroesophageal reflux disease)    History of COVID-19 respiratory pneumonitis June 2020 12/2018   heache, n/v fever, diarrhea symptoms resolved after quarantine and in hospital x 7 days with covid   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   Migraines     Past Surgical History:  Procedure Laterality Date   COLONOSCOPY  12-19-2018  dr Lyndel Safe   PORTACATH PLACEMENT N/A 04/09/2019   Procedure: PLACEMENT OF PORT-A-CATHETER UNDER ULTRASOUND AND FLUOROSCOPIC GUIDANCE;  Surgeon: Michael Boston, MD;  [facility];  Service: General;  Laterality: N/A;   PROCTOSCOPY N/A 03/11/2019   Procedure: RIGID PROCTOSCOPY;  Surgeon: Michael Boston, MD;  [facility];  Service: General;  Laterality: N/A;   XI ROBOTIC ASSISTED COLOSTOMY TAKEDOWN N/A 03/11/2019   Procedure: XI ROBOTIC ASSISTED LEFT COLECTOMY, MOBILIZATION OF SPLENIC FLEXURE, BILATERAL TAP BLOCK;  Surgeon: Michael Boston, MD;  [facility];  Service: General;  Laterality: N/A;    Social History   Socioeconomic History   Marital status: Married    Spouse name: Not on file   Number of children: Not on file   Years of education: Not on file   Highest education level: Not on file  Occupational History   Not on file  Tobacco Use   Smoking status: Never Smoker   Smokeless tobacco: Never Used  Substance and Sexual Activity   Alcohol use: Not Currently   Drug use: No   Sexual activity:  Not on file  Other Topics Concern   Not on file  Social History Narrative   Not on file   Social Determinants of Health   Financial Resource Strain:    Difficulty of Paying Living Expenses:   Food Insecurity:    Worried About [place] in the Last Year:    Arboriculturist in the Last Year:   Transportation Needs:    Film/video editor (Medical):    Lack of  Transportation (Non-Medical):   Physical Activity:    Days of Exercise per Week:    Minutes of Exercise per Session:   Stress:    Feeling of Stress :   Social Connections:    Frequency of Communication with Friends and Family:    Frequency of Social Gatherings with Friends and Family:    Attends Religious Services:    Active Member of Clubs or Organizations:    Attends Music therapist:    Marital Status:   Intimate Partner Violence:    Fear of Current or Ex-Partner:    Emotionally Abused:    Physically Abused:    Sexually Abused:     Family History  Problem Relation Age of Onset   Colon cancer Neg Hx    Esophageal cancer Neg Hx    Rectal cancer Neg Hx    Stomach cancer Neg Hx     Medications Prior to Admission  Medication Sig Dispense Refill Last Dose   atorvastatin (LIPITOR) 10 MG tablet Take 10 mg by mouth every evening.    12/09/2019 at Unknown time   ibuprofen (ADVIL) 200 MG tablet Take 400 mg by mouth every 6 (six) hours as needed for headache or moderate pain.   Past Month at Unknown time   lidocaine-prilocaine (EMLA) cream Apply 1 application topically as needed. 30 g 0 Past Month at Unknown time   metFORMIN (GLUCOPHAGE) 500 MG tablet Take 1 tablet (500 mg total) by mouth 2 (two) times daily with a meal. (Patient taking differently: Take 1,000 mg by mouth 2 (two) times daily with a meal. ) 60 tablet 11 12/09/2019 at Unknown time    Current Facility-Administered Medications  Medication Dose Route Frequency Provider Last Rate Last Admin   ceFAZolin (ANCEF) IVPB 2g/100 mL premix  2 g Intravenous On Call to OR Michael Boston, MD       lactated ringers infusion   Intravenous Continuous Josephine Igo, MD 50 mL/hr at 12/10/19 0608 New Bag at 12/10/19 0608     No Known Allergies  BP 118/74   Pulse (!) 57   Temp 98 F (36.7 C) (Oral)   Resp 15   Ht _0  (1.702 m)   Wt 74.4 kg   SpO2 100%   BMI 25.69 kg/m   Labs: Results for orders placed or  performed during the hospital encounter of 12/10/19 (from the past 48 hour(s))  Glucose, capillary     Status: None   Collection Time: 12/10/19  6:06 AM  Result Value Ref Range   Glucose-Capillary 87 70 - 99 mg/dL    Comment: Glucose reference range applies only to samples taken after fasting for at least 8 hours.    Imaging / Studies: CT Chest W Contrast  Result Date: 11/26/2019 CLINICAL DATA:  Sigmoid colon cancer diagnosed May 2020, surgically treated and August 2020. Chemotherapy, radiation therapy, and immunotherapy completed. EXAM: CT CHEST, ABDOMEN, AND PELVIS WITH CONTRAST TECHNIQUE: Multidetector CT imaging of the chest, abdomen and pelvis was performed  following the standard protocol during bolus administration of intravenous contrast. CONTRAST:  129m OMNIPAQUE IOHEXOL 300 MG/ML  SOLN COMPARISON:  01/20/2019 FINDINGS: CT CHEST FINDINGS Cardiovascular: Right Port-A-Cath tip: Cavoatrial junction. Mild atherosclerotic calcification in the aortic arch. Mediastinum/Nodes: Unremarkable Lungs/Pleura: Biapical pleuroparenchymal scarring. Old granulomatous disease. 3 by 4 mm left upper lobe nodule on image 88/6 is chronically stable. Subsegmental atelectasis both lower lobes. Musculoskeletal: Unremarkable CT ABDOMEN PELVIS FINDINGS Hepatobiliary: Unremarkable Pancreas: Unremarkable Spleen: Unremarkable Adrenals/Urinary Tract: Unremarkable Stomach/Bowel: Partial colectomy. Staple line appears normal in the pelvis, image 106/2. There is some frothy fluid in the distal colon and rectum, diarrheal process suspected. Vascular/Lymphatic: Aortoiliac atherosclerotic vascular disease. No pathologic adenopathy observed. Reproductive: Unremarkable Other: No supplemental non-categorized findings. Musculoskeletal: Lumbar spondylosis and degenerative disc disease causing impingement at L4-5 and L5-S1. IMPRESSION: 1. No findings of recurrent malignancy. 2. Other imaging findings of potential clinical significance: Old  granulomatous disease. Lumbar spondylosis and degenerative disc disease causing impingement at L4-5 and L5-S1. 3. Aortic atherosclerosis. Aortic Atherosclerosis (ICD10-I70.0). Electronically Signed   By: WVan ClinesM.D.   On: 11/26/2019 13:36   CT Abdomen Pelvis W Contrast  Result Date: 11/26/2019 CLINICAL DATA:  Sigmoid colon cancer diagnosed May 2020, surgically treated and August 2020. Chemotherapy, radiation therapy, and immunotherapy completed. EXAM: CT CHEST, ABDOMEN, AND PELVIS WITH CONTRAST TECHNIQUE: Multidetector CT imaging of the chest, abdomen and pelvis was performed following the standard protocol during bolus administration of intravenous contrast. CONTRAST:  1067mOMNIPAQUE IOHEXOL 300 MG/ML  SOLN COMPARISON:  01/20/2019 FINDINGS: CT CHEST FINDINGS Cardiovascular: Right Port-A-Cath tip: Cavoatrial junction. Mild atherosclerotic calcification in the aortic arch. Mediastinum/Nodes: Unremarkable Lungs/Pleura: Biapical pleuroparenchymal scarring. Old granulomatous disease. 3 by 4 mm left upper lobe nodule on image 88/6 is chronically stable. Subsegmental atelectasis both lower lobes. Musculoskeletal: Unremarkable CT ABDOMEN PELVIS FINDINGS Hepatobiliary: Unremarkable Pancreas: Unremarkable Spleen: Unremarkable Adrenals/Urinary Tract: Unremarkable Stomach/Bowel: Partial colectomy. Staple line appears normal in the pelvis, image 106/2. There is some frothy fluid in the distal colon and rectum, diarrheal process suspected. Vascular/Lymphatic: Aortoiliac atherosclerotic vascular disease. No pathologic adenopathy observed. Reproductive: Unremarkable Other: No supplemental non-categorized findings. Musculoskeletal: Lumbar spondylosis and degenerative disc disease causing impingement at L4-5 and L5-S1. IMPRESSION: 1. No findings of recurrent malignancy. 2. Other imaging findings of potential clinical significance: Old granulomatous disease. Lumbar spondylosis and degenerative disc disease causing  impingement at L4-5 and L5-S1. 3. Aortic atherosclerosis. Aortic Atherosclerosis (ICD10-I70.0). Electronically Signed   By: WaVan Clines.D.   On: 11/26/2019 13:36     .StAdin HectorM.D., F.A.C.S. Gastrointestinal and Minimally Invasive Surgery [facility] [id number] [phone] / Paging  12/10/2019 7:18 AM

## 2019-12-10 NOTE — Anesthesia Postprocedure Evaluation (Signed)
Anesthesia Post Note  Patient: Oscar Mccarty  Procedure(s) Performed: REMOVAL PORT-A-CATH (Right Chest)     Patient location during evaluation: PACU Anesthesia Type: MAC Level of consciousness: awake and alert Pain management: pain level controlled Vital Signs Assessment: post-procedure vital signs reviewed and stable Respiratory status: spontaneous breathing, nonlabored ventilation, respiratory function stable and patient connected to nasal cannula oxygen Cardiovascular status: stable and blood pressure returned to baseline Postop Assessment: no apparent nausea or vomiting Anesthetic complications: no    Last Vitals:  Vitals:   12/10/19 0817 12/10/19 0845  BP: (!) 88/50   Pulse: 61 66  Resp: 15 13  Temp: (!) 36.3 C   SpO2: 100% 99%    Last Pain:  Vitals:   12/10/19 0845  TempSrc:   PainSc: 0-No pain                 Barnet Glasgow

## 2019-12-10 NOTE — Anesthesia Procedure Notes (Signed)
Procedure Name: MAC Date/Time: 12/10/2019 7:37 AM Performed by: Mechele Claude, CRNA Pre-anesthesia Checklist: Patient identified, Emergency Drugs available, Suction available, Patient being monitored and Timeout performed Oxygen Delivery Method: Simple face mask Placement Confirmation: positive ETCO2,  CO2 detector and breath sounds checked- equal and bilateral

## 2019-12-10 NOTE — Transfer of Care (Signed)
Last Vitals:  Vitals Value Taken Time  BP 88/50 12/10/19 0817  Temp 36.3 C 12/10/19 0817  Pulse 61 12/10/19 0821  Resp 12 12/10/19 0821  SpO2 99 % 12/10/19 0821  Vitals shown include unvalidated device data.  Last Pain:  Vitals:   12/10/19 0817  TempSrc:   PainSc: 0-No pain      Patients Stated Pain Goal: 2 (12/10/19 0548) Immediate Anesthesia Transfer of Care Note  Patient: Oscar Mccarty  Procedure(s) Performed: Procedure(s) (LRB): REMOVAL PORT-A-CATH (Right)  Patient Location: PACU  Anesthesia Type: MAC  Level of Consciousness: awake, alert  and oriented  Airway & Oxygen Therapy: Patient Spontanous Breathing and Patient connected to face mask oxygen  Post-op Assessment: Report given to PACU RN and Post -op Vital signs reviewed and stable  Post vital signs: Reviewed and stable  Complications: No apparent anesthesia complications

## 2019-12-10 NOTE — Interval H&P Note (Signed)
History and Physical Interval Note:  12/10/2019 7:28 AM  Oscar Mccarty  has presented today for surgery, with the diagnosis of colon cancer.  The various methods of treatment have been discussed with the patient and family. After consideration of risks, benefits and other options for treatment, the patient has consented to  Procedure(s) with comments: REMOVAL PORT-A-CATH (N/A) - mac and local as a surgical intervention.  The patient's history has been reviewed, patient examined, no change in status, stable for surgery.  I have reviewed the patient's chart and labs.  Questions were answered to the patient's satisfaction.     [name]

## 2019-12-10 NOTE — Discharge Instructions (Signed)
GENERAL SURGERY: POST OP INSTRUCTIONS  ######################################################################  EAT Gradually transition to a high fiber diet with a fiber supplement over the next few weeks after discharge.  Start with a pureed / full liquid diet (see below)  WALK Walk an hour a day.  Control your pain to do that.    CONTROL PAIN Control pain so that you can walk, sleep, tolerate sneezing/coughing, go up/down stairs.  HAVE A BOWEL MOVEMENT DAILY Keep your bowels regular to avoid problems.  OK to try a laxative to override constipation.  OK to use an antidairrheal to slow down diarrhea.  Call if not better after 2 tries  CALL IF YOU HAVE PROBLEMS/CONCERNS Call if you are still struggling despite following these instructions. Call if you have concerns not answered by these instructions  ######################################################################    1. DIET: Follow a light bland diet & liquids the first 24 hours after arrival home, such as soup, liquids, starches, etc.  Be sure to drink plenty of fluids.  Quickly advance to a usual solid diet within a few days.  Avoid fast food or heavy meals as your are more likely to get nauseated or have irregular bowels.  A low-fat, high-fiber diet for the rest of your life is ideal.   2. Take your usually prescribed home medications unless otherwise directed. 3. PAIN CONTROL: a. Pain is best controlled by a usual combination of three different methods TOGETHER: i. Ice/Heat ii. Over the counter pain medication iii. Prescription pain medication b. Most patients will experience some swelling and bruising around the incisions.  Ice packs or heating pads (30-60 minutes up to 6 times a day) will help. Use ice for the first few days to help decrease swelling and bruising, then switch to heat to help relax tight/sore spots and speed recovery.  Some people prefer to use ice alone, heat alone, alternating between ice & heat.   Experiment to what works for you.  Swelling and bruising can take several weeks to resolve.   c. It is helpful to take an over-the-counter pain medication regularly for the first few weeks.  Choose one of the following that works best for you: i. Naproxen (Aleve, etc)  Two 220mg tabs twice a day ii. Ibuprofen (Advil, etc) Three 200mg tabs four times a day (every meal & bedtime) iii. Acetaminophen (Tylenol, etc) 500-650mg four times a day (every meal & bedtime) d. A  prescription for pain medication (such as oxycodone, hydrocodone, etc) should be given to you upon discharge.  Take your pain medication as prescribed.  i. If you are having problems/concerns with the prescription medicine (does not control pain, nausea, vomiting, rash, itching, etc), please call us [phone] to see if we need to switch you to a different pain medicine that will work better for you and/or control your side effect better. ii. If you need a refill on your pain medication, please contact your pharmacy.  They will contact our office to request authorization. Prescriptions will not be filled after 5 pm or on week-ends. 4. Avoid getting constipated.  Between the surgery and the pain medications, it is common to experience some constipation.  Increasing fluid intake and taking a fiber supplement (such as Metamucil, Citrucel, FiberCon, MiraLax, etc) 1-2 times a day regularly will usually help prevent this problem from occurring.  A mild laxative (prune juice, Milk of Magnesia, MiraLax, etc) should be taken according to package directions if there are no bowel movements after 48 hours.   5. Wash /   shower every day.  You may shower over the dressings as they are waterproof.  Continue to shower over incision(s) after the dressing is off. 6. Remove your waterproof bandages 5 days after surgery.  You may leave the incision open to air.  You may have skin tapes (Steri Strips) covering the incision(s).  Leave them on until one week, then  remove.  You may replace a dressing/Band-Aid to cover the incision for comfort if you wish.  7. ACTIVITIES as tolerated:   a. You may resume regular (light) daily activities beginning the next day--such as daily self-care, walking, climbing stairs--gradually increasing activities as tolerated.  If you can walk 30 minutes without difficulty, it is safe to try more intense activity such as jogging, treadmill, bicycling, low-impact aerobics, swimming, etc. b. Save the most intensive and strenuous activity for last such as sit-ups, heavy lifting, contact sports, etc  Refrain from any heavy lifting or straining until you are off narcotics for pain control.   c. DO NOT PUSH THROUGH PAIN.  Let pain be your guide: If it hurts to do something, don't do it.  Pain is your body warning you to avoid that activity for another week until the pain goes down. d. You may drive when you are no longer taking prescription pain medication, you can comfortably wear a seatbelt, and you can safely maneuver your car and apply brakes. e. Dennis Bast may have sexual intercourse when it is comfortable.  8. FOLLOW UP in our office a. Please call [facility] at [phone] to set up an appointment to see your surgeon in the office for a follow-up appointment approximately 2-3 weeks after your surgery. b. Make sure that you call for this appointment the day you arrive home to insure a convenient appointment time. 9. IF YOU HAVE DISABILITY OR FAMILY LEAVE FORMS, BRING THEM TO THE OFFICE FOR PROCESSING.  DO NOT GIVE THEM TO YOUR DOCTOR.   WHEN TO CALL us [phone]: 1. Poor pain control 2. Reactions / problems with new medications (rash/itching, nausea, etc)  3. Fever over 101.5 F (38.5 C) 4. Worsening swelling or bruising 5. Continued bleeding from incision. 6. Increased pain, redness, or drainage from the incision 7. Difficulty breathing / swallowing   The clinic staff is available to answer your questions during regular business  hours (8:30am-5pm).  Please don't hesitate to call and ask to speak to one of our nurses for clinical concerns.   If you have a medical emergency, go to the nearest emergency room or call 911.  A surgeon from [facility] is always on call at the [facility], [place], [place], [place], [place]  [address] ? MAIN: [phone] ? TOLL FREE: [phone] ?  FAX [phone] [email or web address]  Implanted Port Removal, Care After This sheet gives you information about how to care for yourself after your procedure. Your health care provider may also give you more specific instructions. If you have problems or questions, contact your health care provider. What can I expect after the procedure? After the procedure, it is common to have:  Soreness or pain near your incision.  Some swelling or bruising near your incision. Follow these instructions at home: Medicines  Take over-the-counter and prescription medicines only as told by your health care provider.  If you were prescribed an antibiotic medicine, take it as told by your health care provider. Do not stop taking the antibiotic even if you start to feel  better. Bathing  Do not take baths, swim, or use a hot tub until your health care provider approves. Ask your health care provider if you can take showers. You may only be allowed to take sponge baths. Incision care   Follow instructions from your health care provider about how to take care of your incision. Make sure you: ? Wash your hands with soap and water before you change your bandage (dressing). If soap and water are not available, use hand sanitizer. ? Change your dressing as told by your health care provider. ? Keep your dressing dry. ? Leave stitches (sutures), skin glue, or adhesive strips in place. These skin closures may need to stay in place for 2 weeks or longer. If adhesive strip edges start to loosen  and curl up, you may trim the loose edges. Do not remove adhesive strips completely unless your health care provider tells you to do that.  Check your incision area every day for signs of infection. Check for: ? More redness, swelling, or pain. ? More fluid or blood. ? Warmth. ? Pus or a bad smell. Driving   Do not drive for 24 hours if you were given a medicine to help you relax (sedative) during your procedure.  If you did not receive a sedative, ask your health care provider when it is safe to drive. Activity  Return to your normal activities as told by your health care provider. Ask your health care provider what activities are safe for you.  Do not lift anything that is heavier than 10 lb (4.5 kg), or the limit that you are told, until your health care provider says that it is safe.  Do not do activities that involve lifting your arms over your head. General instructions  Do not use any products that contain nicotine or tobacco, such as cigarettes and e-cigarettes. These can delay healing. If you need help quitting, ask your health care provider.  Keep all follow-up visits as told by your health care provider. This is important. Contact a health care provider if:  You have more redness, swelling, or pain around your incision.  You have more fluid or blood coming from your incision.  Your incision feels warm to the touch.  You have pus or a bad smell coming from your incision.  You have pain that is not relieved by your pain medicine. Get help right away if you have:  A fever or chills.  Chest pain.  Difficulty breathing. Summary  After the procedure, it is common to have pain, soreness, swelling, or bruising near your incision.  If you were prescribed an antibiotic medicine, take it as told by your health care provider. Do not stop taking the antibiotic even if you start to feel better.  Do not drive for 24 hours if you were given a sedative during your  procedure.  Return to your normal activities as told by your health care provider. Ask your health care provider what activities are safe for you. This information is not intended to replace advice given to you by your health care provider. Make sure you discuss any questions you have with your health care provider. Document Revised: 08/22/2017 Document Reviewed: 08/22/2017 Elsevier Patient Education  [place] Instructions  Activity: Get plenty of rest for the remainder of the day. A responsible individual must stay with you for 24 hours following the procedure.  For the next 24 hours, DO NOT: -Drive a car Film/video editor -  Drink alcoholic beverages -Take any medication unless instructed by your physician -Make any legal decisions or sign important papers.  Meals: Start with liquid foods such as gelatin or soup. Progress to regular foods as tolerated. Avoid greasy, spicy, heavy foods. If nausea and/or vomiting occur, drink only clear liquids until the nausea and/or vomiting subsides. Call your physician if vomiting continues.  Special Instructions/Symptoms: Your throat may feel dry or sore from the anesthesia or the breathing tube placed in your throat during surgery. If this causes discomfort, gargle with warm salt water. The discomfort should disappear within 24 hours.

## 2019-12-10 NOTE — Op Note (Signed)
12/10/2019  8:11 AM  PATIENT:  Oscar Mccarty  54 y.o. male  Patient Care Team: Enid Skeens., MD as PCP - General (Family Medicine) Ladell Pier, MD as Consulting Physician (Oncology) Jackquline Denmark, MD as Consulting Physician (Gastroenterology) Michael Boston, MD as Consulting Physician (General Surgery)  PRE-OPERATIVE DIAGNOSIS:  Colon cancer  POST-OPERATIVE DIAGNOSIS:  Colon cancer s/p chemotherapy  PROCEDURE: REMOVAL PORT-A-CATH  SURGEON:  [name], MD  ASSISTANT: none   ANESTHESIA:   local and IV sedation  EBL:  Total I/O In: 100 [IV Piggyback:100] Out: -   Delay start of Pharmacological VTE agent (>24hrs) due to surgical blood loss or risk of bleeding:  no  DRAINS: none   SPECIMEN:  No Specimen  DISPOSITION OF SPECIMEN:  N/A  COUNTS:  YES  PLAN OF CARE: Discharge to home after PACU  PATIENT DISPOSITION:  PACU - hemodynamically stable.  INDICATION: Pleasant patient that has completed parenteral intravenous therapy.  Desire for Port-A-Cath removal.  I recommended surgery to remove the catheter.  I explained the technique of removal with use of local anesthesia & possible need for more aggressive sedation/anesthesia for patient comfort.    Risks such as bleeding, infection, injury to other organs, need for repair of tissues / organs, need for further treatment, and other risks were discussed.  Post-operative dressing/incision care was discussed.  I noted a good likelihood this will help address the problem.   We will work to minimize complications. Questions were answered.  The patient expresses understanding & wishes to proceed with surgery.  OR FINDINGS: Normal-appearing anatomy.  It was an 8 [place] power port. Removed intact  DESCRIPTION:   Procedure: Informed consent was confirmed. Patient was brought the operating room. and positioned supine. Arms were tucked. The patient underwent deep sedation. Neck and chest were clipped and prepped and  draped in a sterile fashion. A surgical timeout confirmed our plan.  I placed a field block of local anesthesia on the neck & chest  Made incision over the original scar in the infraclavicular space.  Came to the dermis and subcutaneous tissues to encounter the Port-A-Cath.  I was able to get the catheter out of the neck and into the wound.  Used that to help place traction on port.  I dissected around the pocket and removed the Prolene sutures and thus released the port out of the subcutaneous pocket.  Stitches removed.  Hemostasis ensured.  Wound closed with interrupted deep dermal and running subcuticular absorbable suture.  Sterile dressing placed.  I discussed operative findings, updated the patient's status, discussed probable steps to recovery, and gave postoperative recommendations to the patient's spouse.  Recommendations were made.  Questions were answered.  She expressed understanding & appreciation.    [name], M.D., F.A.C.S. Gastrointestinal and Minimally Invasive Surgery [facility] [address] [place], [place] [address] [phone] Main / Paging

## 2020-02-09 ENCOUNTER — Encounter: Payer: Self-pay | Admitting: Gastroenterology

## 2020-02-10 ENCOUNTER — Encounter: Payer: Self-pay | Admitting: Gastroenterology

## 2020-03-10 ENCOUNTER — Encounter: Payer: Self-pay | Admitting: Gastroenterology

## 2020-04-06 ENCOUNTER — Encounter: Payer: BC Managed Care – PPO | Admitting: Gastroenterology

## 2020-05-04 ENCOUNTER — Telehealth: Payer: Self-pay | Admitting: Nurse Practitioner

## 2020-05-04 ENCOUNTER — Telehealth: Payer: Self-pay | Admitting: *Deleted

## 2020-05-04 NOTE — Telephone Encounter (Signed)
Left VM requesting his 11/10 appointments be moved to 11/9 or 11/12. Scheduling message sent w/his request.

## 2020-05-04 NOTE — Telephone Encounter (Signed)
Called pt per 10/12 schedule message to reschedule appt - no answer. Left message for patient to call back to reschedule appt.

## 2020-05-06 ENCOUNTER — Ambulatory Visit (INDEPENDENT_AMBULATORY_CARE_PROVIDER_SITE_OTHER): Payer: BC Managed Care – PPO | Admitting: Gastroenterology

## 2020-05-06 ENCOUNTER — Encounter: Payer: Self-pay | Admitting: Gastroenterology

## 2020-05-06 VITALS — BP 124/76 | HR 66 | Ht 67.0 in | Wt 168.1 lb

## 2020-05-06 DIAGNOSIS — Z85038 Personal history of other malignant neoplasm of large intestine: Secondary | ICD-10-CM | POA: Diagnosis not present

## 2020-05-06 NOTE — Patient Instructions (Addendum)
It has been recommended to you by your physician that you have a(n) colonoscopy completed. Per your request, we did not schedule the procedure(s) today. Please contact our office at [phone] should you decide to have the procedure completed. You will be scheduled for a pre-visit and procedure at that time.  Thank you,  Dr. Jackquline Denmark

## 2020-05-06 NOTE — Progress Notes (Signed)
Chief Complaint: FU  Referring Provider:  Enid Skeens., MD      ASSESSMENT AND PLAN;   #1.  Stage IIIb (T4N1M0) colon Ca s/p lap left-sided colectomy 02/2019 followed by neoadjuvant chemo.  Being closely followed by Dr. Benay Spice. Nl CEA, Neg.  CT Abdo/pelvis/chest.  Plan: -Proceed with colon for surveillance.  Explained risks and benefits to the patient patient's wife in detail. -Discussed familial screening as well.  HPI:    Oscar Mccarty is a 54 y.o. male  For follow-up visit.  No GI complaints.  No nausea, vomiting, heartburn, regurgitation, odynophagia or dysphagia.  No significant diarrhea or constipation.  No melena or hematochezia. No unintentional weight loss. No abdominal pain.  His son age 28 had GI problems.  Had negative ultrasound.  Getting EGD/colonoscopy by pediatric GI at [facility].   Past Medical History:  Diagnosis Date  . Adenocarcinoma of sigmoid colon ([place]) 12/22/2018   Malignant partially obstructing tumor in the proximal sigmoid colon  . Allergic rhinitis   . Depression   . Diabetes mellitus, type 2 ([place]) 12/25/2018   new diagnosis, A1C 9.2% 12/25/2018  . Displaced fracture of distal phalanx of left middle finger 04/03/2016  . GERD (gastroesophageal reflux disease)   . History of COVID-19 respiratory pneumonitis June 2020 12/2018   heache, n/v fever, diarrhea symptoms resolved after quarantine and in hospital x 7 days with covid  . 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 Migraines     Past Surgical History:  Procedure Laterality Date  . COLONOSCOPY  12-19-2018  dr Lyndel Safe  . PORT-A-CATH REMOVAL Right 12/10/2019   Procedure: REMOVAL PORT-A-CATH;  Surgeon: Michael Boston, MD;  Location: [facility];  Service: General;  Laterality: Right;  mac and local  . PORTACATH PLACEMENT N/A 04/09/2019   Procedure: PLACEMENT OF PORT-A-CATHETER UNDER ULTRASOUND AND FLUOROSCOPIC GUIDANCE;  Surgeon:  Michael Boston, MD;  Location: [facility];  Service: General;  Laterality: N/A;  . PROCTOSCOPY N/A 03/11/2019   Procedure: RIGID PROCTOSCOPY;  Surgeon: Michael Boston, MD;  Location: [facility];  Service: General;  Laterality: N/A;  . XI ROBOTIC ASSISTED COLOSTOMY TAKEDOWN N/A 03/11/2019   Procedure: XI ROBOTIC ASSISTED LEFT COLECTOMY, MOBILIZATION OF SPLENIC FLEXURE, BILATERAL TAP BLOCK;  Surgeon: Michael Boston, MD;  Location: [facility];  Service: General;  Laterality: N/A;    Family History  Problem Relation Age of Onset  . Colon cancer Neg Hx   . Esophageal cancer Neg Hx   . Rectal cancer Neg Hx   . Stomach cancer Neg Hx     Social History   Tobacco Use  . Smoking status: Never Smoker  . Smokeless tobacco: Never Used  Vaping Use  . Vaping Use: Never used  Substance Use Topics  . Alcohol use: Not Currently  . Drug use: No    Current Outpatient Medications  Medication Sig Dispense Refill  . atorvastatin (LIPITOR) 10 MG tablet Take 10 mg by mouth every evening.     . metFORMIN (GLUCOPHAGE) 500 MG tablet Take 1 tablet (500 mg total) by mouth 2 (two) times daily with a meal. 60 tablet 11  . ibuprofen (ADVIL) 200 MG tablet Take 400 mg by mouth every 6 (six) hours as needed for headache or moderate pain. (Patient not taking: Reported on 05/06/2020)     No current facility-administered medications for this visit.    No Known Allergies  Review of Systems:  neg  Physical Exam:    BP 124/76   Pulse 66   Ht _0  (1.702 m)   Wt 168 lb 2 oz (76.3 kg)   BMI 26.33 kg/m  Wt Readings from Last 3 Encounters:  05/06/20 168 lb 2 oz (76.3 kg)  12/10/19 164 lb (74.4 kg)  11/30/19 166 lb 12.8 oz (75.7 kg)   Constitutional:  Well-developed, in no acute distress. Psychiatric: Normal mood and affect. Behavior is normal. HEENT: Pupils normal.  Conjunctivae are normal. No scleral icterus. Cardiovascular: Normal rate, regular rhythm. No edema Pulmonary/chest: Effort normal and breath sounds  normal. No wheezing, rales or rhonchi. Abdominal: Soft, nondistended. Nontender. Bowel sounds active throughout. There are no masses palpable. No hepatomegaly.  Well-healed small surgical scars. Rectal:  defered Neurological: Alert and oriented to person place and time. Skin: Skin is warm and dry. No rashes noted.  Data Reviewed: I have personally reviewed following labs and imaging studies  CBC: CBC Latest Ref Rng & Units 11/26/2019 10/02/2019 09/16/2019  WBC 4.0 - 10.5 K/uL 3.5(L) 6.3 5.9  Hemoglobin 13.0 - 17.0 g/dL 14.5 12.9(L) 12.7(L)  Hematocrit 39 - 52 % 43.8 40.0 39.2  Platelets 150 - 400 K/uL 106(L) 77(L) 75(L)    CMP: CMP Latest Ref Rng & Units 11/26/2019 09/16/2019 09/02/2019  Glucose 70 - 99 mg/dL 98 156(H) 162(H)  BUN 6 - 20 mg/dL _1 Creatinine 0.61 - 1.24 mg/dL 0.77 0.77 0.78  Sodium 135 - 145 mmol/L 141 142 140  Potassium 3.5 - 5.1 mmol/L 4.0 4.2 4.0  Chloride 98 - 111 mmol/L 107 108 106  CO2 22 - 32 mmol/L _2 Calcium 8.9 - 10.3 mg/dL 9.3 8.8(L) 8.6(L)  Total Protein 6.5 - 8.1 g/dL 7.6 7.1 6.8  Total Bilirubin 0.3 - 1.2 mg/dL 0.6 0.4 0.3  Alkaline Phos 38 - 126 U/L 100 184(H) 175(H)  AST 15 - 41 U/L _3 ALT 0 - 44 U/L 36 37 39      Carmell Austria, MD 05/06/2020, 4:18 PM  Cc: Enid Skeens., MD

## 2020-05-09 ENCOUNTER — Encounter: Payer: Self-pay | Admitting: Gastroenterology

## 2020-05-09 ENCOUNTER — Telehealth: Payer: Self-pay | Admitting: Gastroenterology

## 2020-05-09 NOTE — Telephone Encounter (Signed)
LMOM for patient to call the office. 

## 2020-05-09 NOTE — Telephone Encounter (Signed)
Pt is requesting a call back to discuss colonoscopy procedure with the nurse.

## 2020-05-10 ENCOUNTER — Encounter: Payer: Self-pay | Admitting: Gastroenterology

## 2020-05-10 NOTE — Telephone Encounter (Signed)
Can you please contact this patient. He will need to reschedule his procedure since a Pre-visit was not scheduled prior.  Thanks, [employer]

## 2020-05-10 NOTE — Telephone Encounter (Signed)
LMOM for patient to call the office. 

## 2020-05-12 ENCOUNTER — Telehealth: Payer: Self-pay | Admitting: Gastroenterology

## 2020-05-12 ENCOUNTER — Ambulatory Visit (AMBULATORY_SURGERY_CENTER): Payer: Self-pay | Admitting: *Deleted

## 2020-05-12 ENCOUNTER — Encounter: Payer: Self-pay | Admitting: Gastroenterology

## 2020-05-12 ENCOUNTER — Other Ambulatory Visit: Payer: Self-pay

## 2020-05-12 VITALS — Ht 67.0 in | Wt 167.0 lb

## 2020-05-12 DIAGNOSIS — Z85038 Personal history of other malignant neoplasm of large intestine: Secondary | ICD-10-CM

## 2020-05-12 MED ORDER — SUPREP BOWEL PREP KIT 17.5-3.13-1.6 GM/177ML PO SOLN: 1.0000 | Freq: Once | ORAL | 0 refills | Status: AC

## 2020-05-12 NOTE — Telephone Encounter (Signed)
It looks like it was called in today after his Pre-Visit. Please contact the nurse who did his pre-visit today

## 2020-05-12 NOTE — Telephone Encounter (Signed)
Oscar Mccarty, this pt states his pharmacy has not received the prep for SUPREP. [place].

## 2020-05-12 NOTE — Progress Notes (Signed)
cov vax x 2   No egg or soy allergy known to patient  No issues with past sedation with any surgeries or procedures no intubation problems in the past  No FH of Malignant Hyperthermia No diet pills per patient No home 02 use per patient  No blood thinners per patient  Pt denies issues with constipation  No A fib or A flutter  EMMI video to pt or via [place] 19 guidelines implemented in PV today with Pt and RN   Suprep  Coupon given to pt in PV today , Code to Pharmacy   Due to the COVID-19 pandemic we are asking patients to follow these guidelines. Please only bring one care partner. Please be aware that your care partner may wait in the car in the parking lot or if they feel like they will be too hot to wait in the car, they may wait in the lobby on the 4th floor. All care partners are required to wear a mask the entire time (we do not have any that we can provide them), they need to practice social distancing, and we will do a Covid check for all patient's and care partners when you arrive. Also we will check their temperature and your temperature. If the care partner waits in their car they need to stay in the parking lot the entire time and we will call them on their cell phone when the patient is ready for discharge so they can bring the car to the front of the building. Also all patient's will need to wear a mask into building.

## 2020-05-13 ENCOUNTER — Other Ambulatory Visit: Payer: Self-pay

## 2020-05-13 MED ORDER — NA SULFATE-K SULFATE-MG SULF 17.5-3.13-1.6 GM/177ML PO SOLN
ORAL | 0 refills | Status: DC
Start: 2020-05-13 — End: 2020-05-16

## 2020-05-13 NOTE — Telephone Encounter (Signed)
Pt is requesting a call back regarding his prep.

## 2020-05-16 ENCOUNTER — Encounter: Payer: BC Managed Care – PPO | Admitting: Gastroenterology

## 2020-05-16 ENCOUNTER — Encounter: Payer: Self-pay | Admitting: Gastroenterology

## 2020-05-16 ENCOUNTER — Other Ambulatory Visit: Payer: Self-pay

## 2020-05-16 ENCOUNTER — Ambulatory Visit (AMBULATORY_SURGERY_CENTER): Payer: BC Managed Care – PPO | Admitting: Gastroenterology

## 2020-05-16 VITALS — BP 114/65 | HR 55 | Temp 97.8°F | Resp 9 | Ht 67.0 in | Wt 168.0 lb

## 2020-05-16 DIAGNOSIS — Z85038 Personal history of other malignant neoplasm of large intestine: Secondary | ICD-10-CM | POA: Diagnosis not present

## 2020-05-16 MED ORDER — SODIUM CHLORIDE 0.9 % IV SOLN: 500.0000 mL | Freq: Once | INTRAVENOUS | Status: DC

## 2020-05-16 NOTE — Patient Instructions (Signed)
YOU HAD AN ENDOSCOPIC PROCEDURE TODAY AT THE [facility]:   Refer to the procedure report that was given to you for any specific questions about what was found during the examination.  If the procedure report does not answer your questions, please call your gastroenterologist to clarify.  If you requested that your care partner not be given the details of your procedure findings, then the procedure report has been included in a sealed envelope for you to review at your convenience later.  YOU SHOULD EXPECT: Some feelings of bloating in the abdomen. Passage of more gas than usual.  Walking can help get rid of the air that was put into your GI tract during the procedure and reduce the bloating. If you had a lower endoscopy (such as a colonoscopy or flexible sigmoidoscopy) you may notice spotting of blood in your stool or on the toilet paper. If you underwent a bowel prep for your procedure, you may not have a normal bowel movement for a few days.  Please Note:  You might notice some irritation and congestion in your nose or some drainage.  This is from the oxygen used during your procedure.  There is no need for concern and it should clear up in a day or so.  SYMPTOMS TO REPORT IMMEDIATELY:   Following lower endoscopy (colonoscopy or flexible sigmoidoscopy):  Excessive amounts of blood in the stool  Significant tenderness or worsening of abdominal pains  Swelling of the abdomen that is new, acute  Fever of 100F or higher  For urgent or emergent issues, a gastroenterologist can be reached at any hour by calling [phone]. Do not use MyChart messaging for urgent concerns.    DIET:  We do recommend a small meal at first, but then you may proceed to your regular diet.  Drink plenty of fluids but you should avoid alcoholic beverages for 24 hours.  ACTIVITY:  You should plan to take it easy for the rest of today and you should NOT DRIVE or use heavy machinery until tomorrow (because  of the sedation medicines used during the test).    FOLLOW UP: Our staff will call the number listed on your records 48-72 hours following your procedure to check on you and address any questions or concerns that you may have regarding the information given to you following your procedure. If we do not reach you, we will leave a message.  We will attempt to reach you two times.  During this call, we will ask if you have developed any symptoms of COVID 19. If you develop any symptoms (ie: fever, flu-like symptoms, shortness of breath, cough etc.) before then, please call [phone].  If you test positive for Covid 19 in the 2 weeks post procedure, please call and report this information to us.    If any biopsies were taken you will be contacted by phone or by letter within the next 1-3 weeks.  Please call us at [phone] if you have not heard about the biopsies in 3 weeks.    SIGNATURES/CONFIDENTIALITY: You and/or your care partner have signed paperwork which will be entered into your electronic medical record.  These signatures attest to the fact that that the information above on your After Visit Summary has been reviewed and is understood.  Full responsibility of the confidentiality of this discharge information lies with you and/or your care-partner. 

## 2020-05-16 NOTE — Op Note (Signed)
[place] [facility] Patient Name: Oscar Mccarty Procedure Date: 05/16/2020 8:29 AM MRN: [id number] Endoscopist: Jackquline Denmark , MD Age: 54 Referring MD:  Date of Birth: 1965/09/25 Gender: Male Account #: [id number] Procedure:                Colonoscopy Indications:              High risk colon cancer surveillance: Personal                            history of colon cancer. H/O Stage IIIb (T4N1M0)                            colon Ca s/p lap left-sided colectomy 02/2019                            followed by chemo. Being closely followed by Dr.                            Benay Spice. Nl CEA, Neg. CT Abdo/pelvis/chest. Medicines:                Monitored Anesthesia Care Procedure:                Pre-Anesthesia Assessment:                           - Prior to the procedure, a History and Physical                            was performed, and patient medications and                            allergies were reviewed. The patient's tolerance of                            previous anesthesia was also reviewed. The risks                            and benefits of the procedure and the sedation                            options and risks were discussed with the patient.                            All questions were answered, and informed consent                            was obtained. Prior Anticoagulants: The patient has                            taken no previous anticoagulant or antiplatelet                            agents. ASA Grade Assessment: II - A patient with  mild systemic disease. After reviewing the risks                            and benefits, the patient was deemed in                            satisfactory condition to undergo the procedure.                           After obtaining informed consent, the colonoscope                            was passed under direct vision. Throughout the                            procedure, the patient's blood  pressure, pulse, and                            oxygen saturations were monitored continuously. The                            Colonoscope was introduced through the anus and                            advanced to the 2 cm into the ileum. The                            colonoscopy was performed without difficulty. The                            patient tolerated the procedure well. The quality                            of the bowel preparation was good. The terminal                            ileum, ileocecal valve, appendiceal orifice, and                            rectum were photographed. Scope In: 8:33:33 AM Scope Out: 8:44:57 AM Scope Withdrawal Time: 0 hours 8 minutes 44 seconds  Total Procedure Duration: 0 hours 11 minutes 24 seconds  Findings:                 There was evidence of a prior end-to-end                            colo-colonic anastomosis in the sigmoid colon, 20                            cm from anal verge. This was patent and was                            characterized by healthy appearing mucosa.  The terminal ileum appeared normal.                           The exam was otherwise without abnormality on                            direct and retroflexion views. Complications:            No immediate complications. Estimated Blood Loss:     Estimated blood loss: none. Impression:               - Patent end-to-end colo-colonic anastomosis,                            characterized by healthy appearing mucosa. No                            Recurrence.                           - The examined portion of the ileum was normal.                           - The examination was otherwise normal on direct                            and retroflexion views.                           - No specimens collected. Recommendation:           - Patient has a contact number available for                            emergencies. The signs and symptoms of  potential                            delayed complications were discussed with the                            patient. Return to normal activities tomorrow.                            Written discharge instructions were provided to the                            patient.                           - Resume previous diet.                           - Continue present medications.                           - Repeat colonoscopy in 3 years for surveillance.                           -  Return to GI clinic PRN. Jackquline Denmark, MD 05/16/2020 8:49:58 AM This report has been signed electronically.

## 2020-05-16 NOTE — Progress Notes (Signed)
A/ox3, pleased with MAC, report to RN 

## 2020-05-16 NOTE — Progress Notes (Signed)
VS by CW  Pt's states no medical or surgical changes since previsit or office visit.  

## 2020-05-18 ENCOUNTER — Telehealth: Payer: Self-pay

## 2020-05-18 NOTE — Telephone Encounter (Signed)
  Follow up Call-  Call back number 05/16/2020 12/19/2018  Post procedure Call Back phone  # [phone]  Permission to leave phone message Yes Yes  Some recent data might be hidden     Patient questions:  Do you have a fever, pain , or abdominal swelling? No. Pain Score  0 *  Have you tolerated food without any problems? Yes.    Have you been able to return to your normal activities? Yes.    Do you have any questions about your discharge instructions: Diet   No. Medications  No. Follow up visit  No.  Do you have questions or concerns about your Care? No.  Actions: * If pain score is 4 or above: No action needed, pain <4.   1. Have you developed a fever since your procedure? No   2.   Have you had an respiratory symptoms (SOB or cough) since your procedure? No   3.   Have you tested positive for COVID 19 since your procedure? No   4.   Have you had any family members/close contacts diagnosed with the COVID 19 since your procedure?  No    If yes to any of these questions please route to Joylene John, RN and Joella Prince, RN

## 2020-06-01 ENCOUNTER — Ambulatory Visit: Payer: BC Managed Care – PPO | Admitting: Nurse Practitioner

## 2020-06-01 ENCOUNTER — Other Ambulatory Visit: Payer: BC Managed Care – PPO

## 2020-06-03 ENCOUNTER — Encounter: Payer: Self-pay | Admitting: Nurse Practitioner

## 2020-06-03 ENCOUNTER — Inpatient Hospital Stay: Payer: BC Managed Care – PPO | Attending: Nurse Practitioner

## 2020-06-03 ENCOUNTER — Other Ambulatory Visit: Payer: Self-pay

## 2020-06-03 ENCOUNTER — Telehealth: Payer: Self-pay | Admitting: Nurse Practitioner

## 2020-06-03 ENCOUNTER — Inpatient Hospital Stay (HOSPITAL_BASED_OUTPATIENT_CLINIC_OR_DEPARTMENT_OTHER): Payer: BC Managed Care – PPO | Admitting: Nurse Practitioner

## 2020-06-03 VITALS — BP 122/70 | HR 62 | Temp 97.7°F | Resp 17 | Ht 67.0 in | Wt 170.1 lb

## 2020-06-03 DIAGNOSIS — C187 Malignant neoplasm of sigmoid colon: Secondary | ICD-10-CM | POA: Diagnosis not present

## 2020-06-03 DIAGNOSIS — E119 Type 2 diabetes mellitus without complications: Secondary | ICD-10-CM | POA: Insufficient documentation

## 2020-06-03 DIAGNOSIS — G620 Drug-induced polyneuropathy: Secondary | ICD-10-CM | POA: Diagnosis not present

## 2020-06-03 NOTE — Telephone Encounter (Signed)
Scheduled per 11/12 los. Printed avs and calendar for pt.  

## 2020-06-03 NOTE — Progress Notes (Addendum)
  [place] OFFICE PROGRESS NOTE   Diagnosis: Colon cancer  INTERVAL HISTORY:   Oscar Mccarty returns as scheduled.  He feels well.  No change in bowel habits.  No blood or pain with bowel movements.  He reports a good appetite.  Neuropathy symptoms have improved.  He has mild intermittent numbness in the fingertips and feet periodically feel cold.  Objective:  Vital signs in last 24 hours:  Blood pressure 122/70, pulse 62, temperature 97.7 F (36.5 C), temperature source Tympanic, resp. rate 17, height $RemoveBe'5\' 7"'vEbtGPdYz$  (1.702 m), weight 170 lb 1.6 oz (77.2 kg), SpO2 99 %.    HEENT: Neck without mass. Lymphatics: No palpable cervical, supraclavicular, axillary or inguinal lymph nodes. Resp: Lungs clear bilaterally. Cardio: Regular rate and rhythm. GI: Abdomen soft and nontender.  No hepatomegaly. Vascular: No leg edema.   Lab Results:  Lab Results  Component Value Date   WBC 3.5 (L) 11/26/2019   HGB 14.5 11/26/2019   HCT 43.8 11/26/2019   MCV 85.0 11/26/2019   PLT 106 (L) 11/26/2019   NEUTROABS 1.8 11/26/2019    Imaging:  No results found.  Medications: I have reviewed the patient's current medications.  Assessment/Plan: 1. 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 Microsatellite stable, no loss of mismatch repair protein expression ? CTs 12/24/2018-sigmoid: Wall thickening, multiple enlarged nodes adjacent to the sigmoid colon, no evidence of metastatic disease ? Cycle 1 FOLFOX 04/14/2019 ? Cycle 2 FOLFOX 04/30/2019, Udenyca added ? Cycle 3 FOLFOX 05/14/2019 (oxaliplatin dose reduced to 65 mg/m due to thrombocytopenia) ? Cycle 4 FOLFOX 05/27/2019 (continue oxaliplatin dose reduction to $RemoveBefo'65mg'WqAKySMdGhX$ /m2, PLT 129K) ? Cycle 5 FOLFOX 06/10/2019 ? Cycle 6 FOLFOX 06/24/2019 ? Cycle 7 FOLFOX 07/08/2019 ? Cycle 8 FOLFOX 07/22/2019 ? Cycle 9 FOLFOX 08/05/2019 ? Cycle 10 FOLFOX 08/19/2019 ? Cycle 11 FOLFOX 09/02/2019 ? Cycle  12 FOLFOX 09/16/2019 ? CTs 11/26/2019-negative ? Colonoscopy 05/16/2020-patent end-to-end colocolonic anastomosis, characterized by healthy-appearing mucosa; no recurrence; next colonoscopy 3 years for surveillance 2. COVID-19 respiratory infection June 2020 3. Diabetes diagnosed June 2020 4. Port-A-Cath placement, Dr. Johney Maine, 04/09/2019; Port-A-Cath removed 12/10/2019 5. Heartburn following chemotherapy, prilosec added 06/10/19 6. Periodic dizziness on standing, no significant orthostatic hypotension. 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  We will follow-up on the CEA from today.  He is up-to-date on surveillance colonoscopy.  He will have surveillance CT scans in 6 months.  We will see him a few days later to review the results.  Patient seen with Dr. Benay Spice.  Ned Card ANP/GNP-BC   06/03/2020  3:29 PM This was a shared visit with Ned Card.  Oscar Mccarty is in remission from colon cancer.  He will return for an office visit in 6 months.  Julieanne Manson, MD

## 2020-06-06 LAB — CEA (IN HOUSE-CHCC): CEA (CHCC-In House): 2.3 ng/mL (ref 0.00–5.00)

## 2020-06-28 ENCOUNTER — Telehealth: Payer: Self-pay

## 2020-06-28 NOTE — Telephone Encounter (Signed)
Resident was calling in to receive latest lab results.

## 2020-06-29 ENCOUNTER — Encounter: Payer: Self-pay | Admitting: *Deleted

## 2020-06-29 NOTE — Progress Notes (Signed)
Faxed request from his [facility] requesting last office note, CEA result and next appointment date. These were faxed to [phone].

## 2020-10-12 ENCOUNTER — Telehealth: Payer: Self-pay | Admitting: Oncology

## 2020-10-12 ENCOUNTER — Telehealth: Payer: Self-pay | Admitting: *Deleted

## 2020-10-12 NOTE — Telephone Encounter (Signed)
R/s appts per sch msg. Called and spoke with patient. Confirmed new dates and times

## 2020-10-12 NOTE — Telephone Encounter (Signed)
Called to request his lab on 5/12 be moved to 5/13 and his OV of 5/16 be moved to 5/18 (days off from work). Scheduling message sent with request.

## 2020-11-17 ENCOUNTER — Telehealth: Payer: Self-pay | Admitting: *Deleted

## 2020-11-17 NOTE — Telephone Encounter (Signed)
Scheduled CT scan for 12/02/20 at [facility]: arrive at [place] for 1000 scan. NPO after 6 am and pick up oral contrast any day before scan. Drink oral contrast at 0800 and 0900. Come to [place] at 0900 for his CT labs. Left above information on his voice mail and will mail appointment to him as well.

## 2020-12-01 ENCOUNTER — Other Ambulatory Visit: Payer: BC Managed Care – PPO

## 2020-12-02 ENCOUNTER — Other Ambulatory Visit: Payer: Self-pay | Admitting: Nurse Practitioner

## 2020-12-02 ENCOUNTER — Inpatient Hospital Stay: Payer: BC Managed Care – PPO | Attending: Oncology

## 2020-12-02 ENCOUNTER — Ambulatory Visit (HOSPITAL_COMMUNITY)
Admission: RE | Admit: 2020-12-02 | Discharge: 2020-12-02 | Disposition: A | Discharge status: Home / Self Care | Payer: BC Managed Care – PPO | Source: Ambulatory Visit | Attending: Nurse Practitioner | Admitting: Nurse Practitioner

## 2020-12-02 ENCOUNTER — Other Ambulatory Visit: Payer: Self-pay

## 2020-12-02 DIAGNOSIS — C187 Malignant neoplasm of sigmoid colon: Secondary | ICD-10-CM

## 2020-12-02 DIAGNOSIS — Z8616 Personal history of COVID-19: Secondary | ICD-10-CM | POA: Diagnosis not present

## 2020-12-02 DIAGNOSIS — E119 Type 2 diabetes mellitus without complications: Secondary | ICD-10-CM | POA: Insufficient documentation

## 2020-12-02 DIAGNOSIS — Z9049 Acquired absence of other specified parts of digestive tract: Secondary | ICD-10-CM | POA: Diagnosis not present

## 2020-12-02 LAB — BASIC METABOLIC PANEL - CANCER CENTER ONLY
Anion gap: 6 (ref 5–15)
BUN: 12 mg/dL (ref 6–20)
CO2: 27 mmol/L (ref 22–32)
Calcium: 9.3 mg/dL (ref 8.9–10.3)
Chloride: 106 mmol/L (ref 98–111)
Creatinine: 0.78 mg/dL (ref 0.61–1.24)
GFR, Estimated: >60 mL/min (ref >60)
Glucose, Bld: 120 mg/dL — ABNORMAL HIGH (ref 70–99)
Potassium: 4.4 mmol/L (ref 3.5–5.1)
Sodium: 139 mmol/L (ref 135–145)

## 2020-12-02 LAB — CEA (IN HOUSE-CHCC): CEA (CHCC-In House): 2.41 ng/mL (ref 0.00–5.00)

## 2020-12-02 MED ORDER — SODIUM CHLORIDE (PF) 0.9 % IJ SOLN
INTRAMUSCULAR | Status: AC
  Filled 2020-12-02: qty 50

## 2020-12-02 MED ORDER — IOHEXOL 300 MG/ML  SOLN
75.0000 mL | Freq: Once | INTRAMUSCULAR | Status: AC | PRN
  Administered 2020-12-02: 75 mL via INTRAVENOUS

## 2020-12-05 ENCOUNTER — Ambulatory Visit: Payer: BC Managed Care – PPO | Admitting: Oncology

## 2020-12-07 ENCOUNTER — Other Ambulatory Visit: Payer: Self-pay

## 2020-12-07 ENCOUNTER — Inpatient Hospital Stay (HOSPITAL_BASED_OUTPATIENT_CLINIC_OR_DEPARTMENT_OTHER): Payer: BC Managed Care – PPO | Admitting: Oncology

## 2020-12-07 VITALS — BP 131/78 | HR 66 | Temp 98.2°F | Resp 18 | Ht 67.0 in | Wt 175.8 lb

## 2020-12-07 DIAGNOSIS — C187 Malignant neoplasm of sigmoid colon: Secondary | ICD-10-CM

## 2020-12-07 NOTE — Progress Notes (Signed)
  [place] OFFICE PROGRESS NOTE   Diagnosis: Colon cancer  INTERVAL HISTORY:   [name] returns as scheduled.  He feels well.  No difficulty with bowel function.  No bleeding.  Good appetite.  He is working.  He has an intermittent tingling or sore sensation at the low anterior abdomen when leaning over objects.  No consistent pain.  Objective:  Vital signs in last 24 hours:  Blood pressure 131/78, pulse 66, temperature 98.2 F (36.8 C), temperature source Oral, resp. rate 18, height _0  (1.702 m), weight 175 lb 12.8 oz (79.7 kg), SpO2 99 %.    Lymphatics: No cervical, supraclavicular, axillary, or inguinal nodes Resp: Lungs clear bilaterally Cardio: Regular rate and rhythm GI: No hepatosplenomegaly, no mass, nontender Vascular: No leg edema  Lab Results:  Lab Results  Component Value Date   WBC 3.5 (L) 11/26/2019   HGB 14.5 11/26/2019   HCT 43.8 11/26/2019   MCV 85.0 11/26/2019   PLT 106 (L) 11/26/2019   NEUTROABS 1.8 11/26/2019    CMP  Lab Results  Component Value Date   NA 139 12/02/2020   K 4.4 12/02/2020   CL 106 12/02/2020   CO2 27 12/02/2020   GLUCOSE 120 (H) 12/02/2020   BUN 12 12/02/2020   CREATININE 0.78 12/02/2020   CALCIUM 9.3 12/02/2020   PROT 7.6 11/26/2019   ALBUMIN 4.3 11/26/2019   AST 29 11/26/2019   ALT 36 11/26/2019   ALKPHOS 100 11/26/2019   BILITOT 0.6 11/26/2019   GFRNONAA >60 12/02/2020   GFRAA >60 11/26/2019    Lab Results  Component Value Date   CEA1 2.41 12/02/2020    Medications: I have reviewed the patient's current medications.   Assessment/Plan: 1. 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 Microsatellite stable, no loss of mismatch repair protein expression ? CTs 12/24/2018-sigmoid: Wall thickening, multiple enlarged nodes adjacent to the sigmoid colon, no evidence of metastatic disease ? Cycle 1 FOLFOX 04/14/2019 ? Cycle 2 FOLFOX 04/30/2019,  Udenyca added ? Cycle 3 FOLFOX 05/14/2019 (oxaliplatin dose reduced to 65 mg/m due to thrombocytopenia) ? Cycle 4 FOLFOX 05/27/2019 (continue oxaliplatin dose reduction to 63m/m2, PLT 129K) ? Cycle 5 FOLFOX 06/10/2019 ? Cycle 6 FOLFOX 06/24/2019 ? Cycle 7 FOLFOX 07/08/2019 ? Cycle 8 FOLFOX 07/22/2019 ? Cycle 9 FOLFOX 08/05/2019 ? Cycle 10 FOLFOX 08/19/2019 ? Cycle 11 FOLFOX 09/02/2019 ? Cycle 12 FOLFOX 09/16/2019 ? CTs 11/26/2019-negative ? Colonoscopy 05/16/2020-patent end-to-end colocolonic anastomosis, characterized by healthy-appearing mucosa; no recurrence; next colonoscopy 3 years for surveillance ? CTs 12/02/2020-negative 2. COVID-19 respiratory infection June 2020 3. Diabetes diagnosed June 2020 4. Port-A-Cath placement, Dr. GJohney Maine 04/09/2019; Port-A-Cath removed 12/10/2019 5. Heartburn following chemotherapy, prilosec added 06/10/19 6. Periodic dizziness on standing, no significant orthostatic hypotension. 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 DMccareyis in clinical remission from colon cancer.  He will return for an office visit and CEA in 6 months.  He will be due for a surveillance colonoscopy in 2024.  GBetsy Coder MD  12/07/2020  9:19 AM

## 2020-12-26 ENCOUNTER — Encounter: Payer: Self-pay | Admitting: *Deleted

## 2020-12-26 NOTE — Progress Notes (Signed)
Faxed 12/07/20 office note, recent labs and CT scan to "[facility]" at [phone] att: Theresia Lo, RN

## 2021-03-26 IMAGING — CT CT ABD-PELV W/ CM
3 of 5 series · 13 of 36 positions shown, 16 images · IV contrast (APPLIED)
Comparison: 01/20/2019

CLINICAL DATA: Sigmoid colon cancer diagnosed November 2018, surgically
treated and February 2019. Chemotherapy, radiation therapy, and
immunotherapy completed.

EXAM:
CT CHEST, ABDOMEN, AND PELVIS WITH CONTRAST
TECHNIQUE: Multidetector CT imaging of the chest, abdomen and pelvis was
performed following the standard protocol during bolus
administration of intravenous contrast.
CONTRAST:  100mL OMNIPAQUE IOHEXOL 300 MG/ML  SOLN

[Series 2: cap with · axial · 0.73mm/px · z∈[+1024,+1579]mm · 9 of 139 slices shown, 12 images]
[im 14/139  mediastinal]
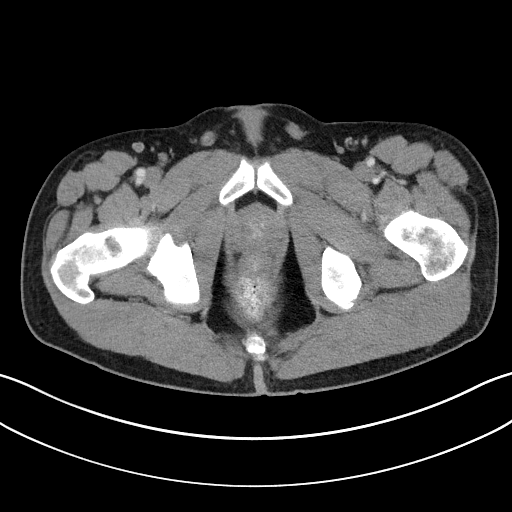
[im 14/139  lung]
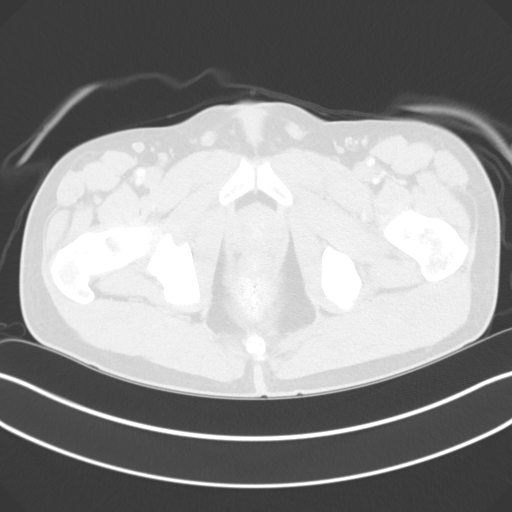
[im 28/139  lung]
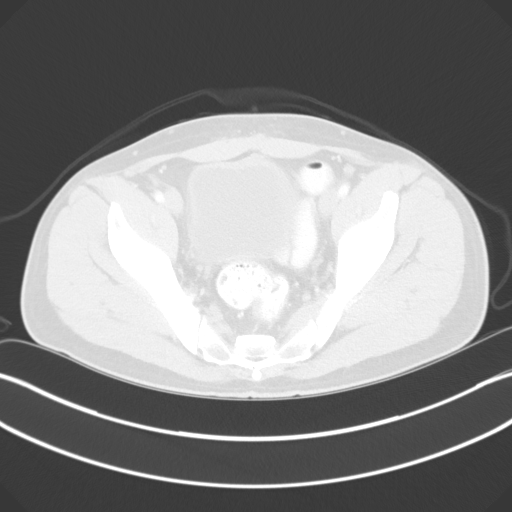
[im 42/139  lung]
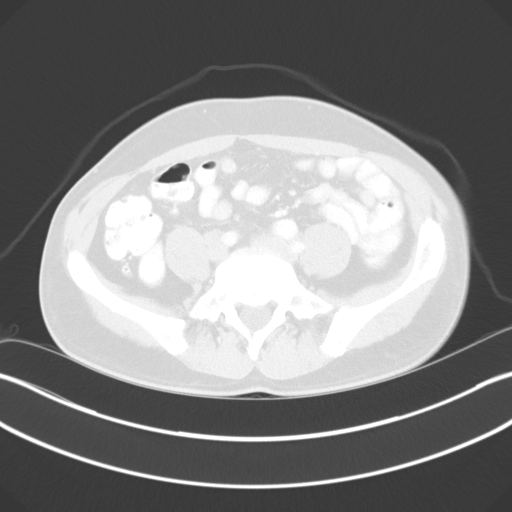
[im 56/139  lung]
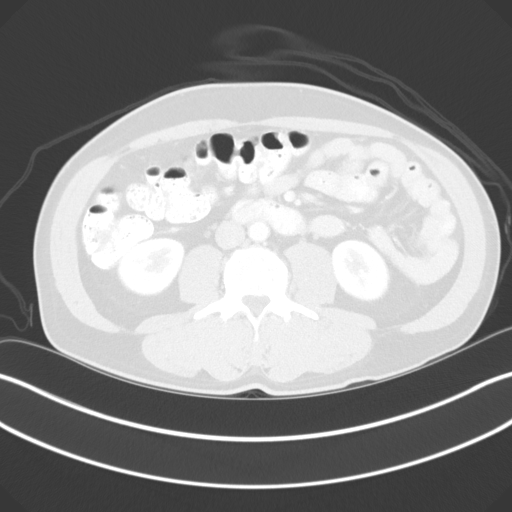
[im 70/139  mediastinal]
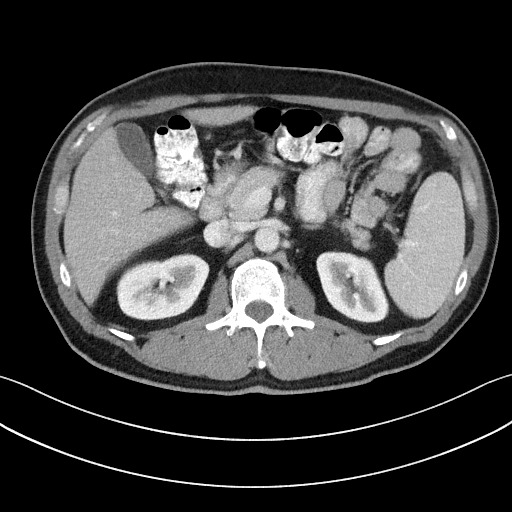
[im 70/139  lung]
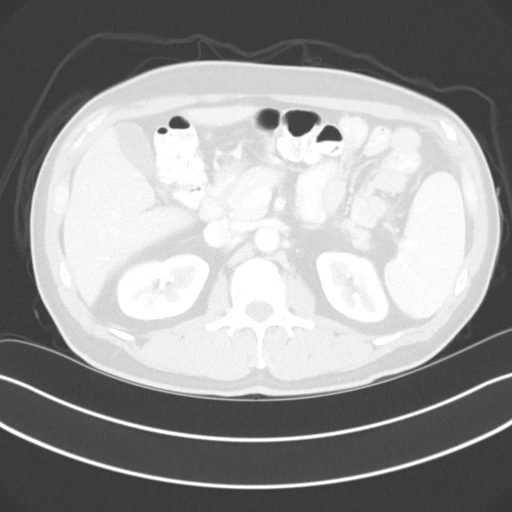
[im 83/139  lung]
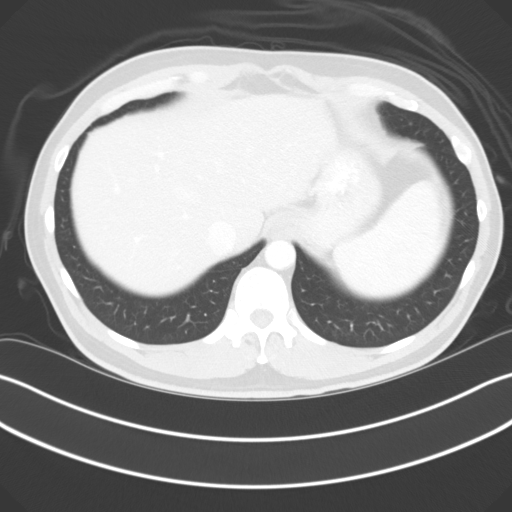
[im 97/139  lung]
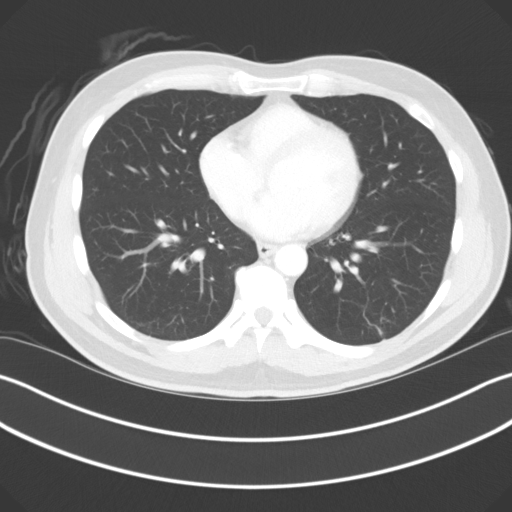
[im 111/139  lung]
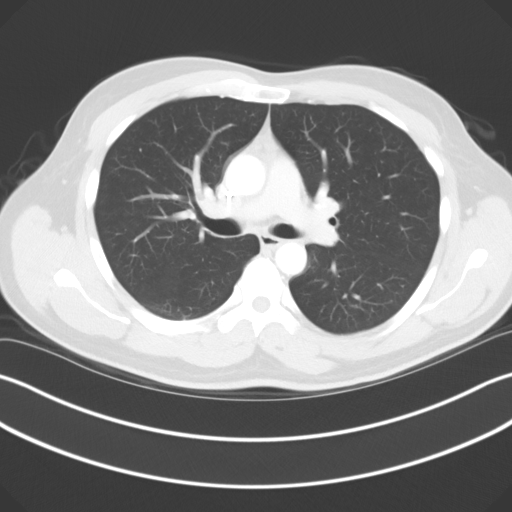
[im 125/139  mediastinal]
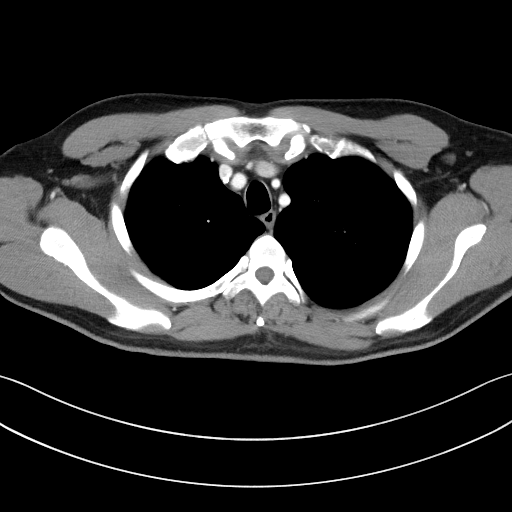
[im 125/139  lung]
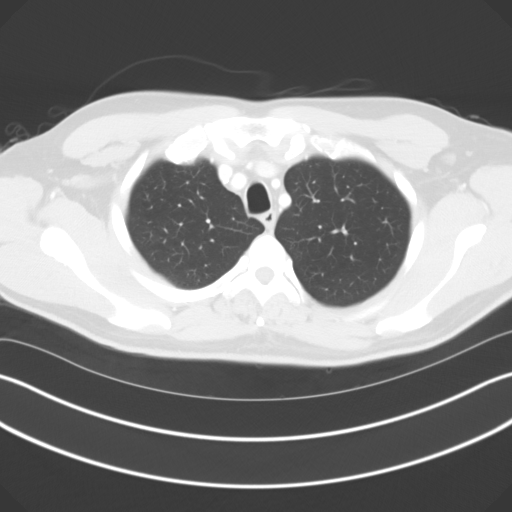

[Series 4: coronals · coronal · 0.77mm/px · 3 of 128 slices shown]
[im 26/128  lung]
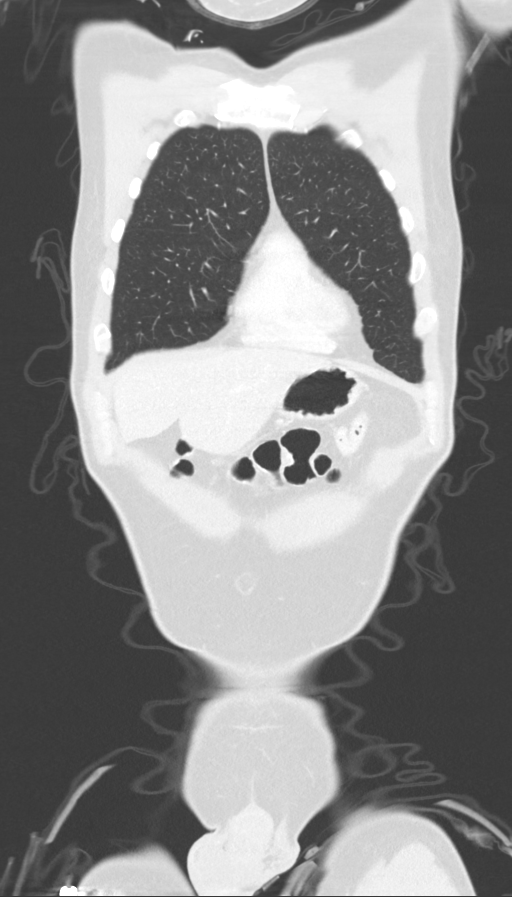
[im 51/128  lung]
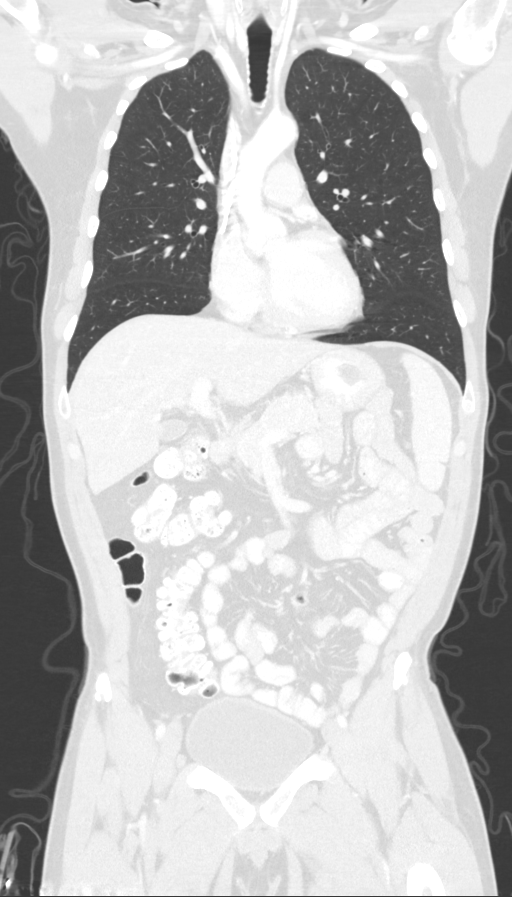
[im 77/128  lung]
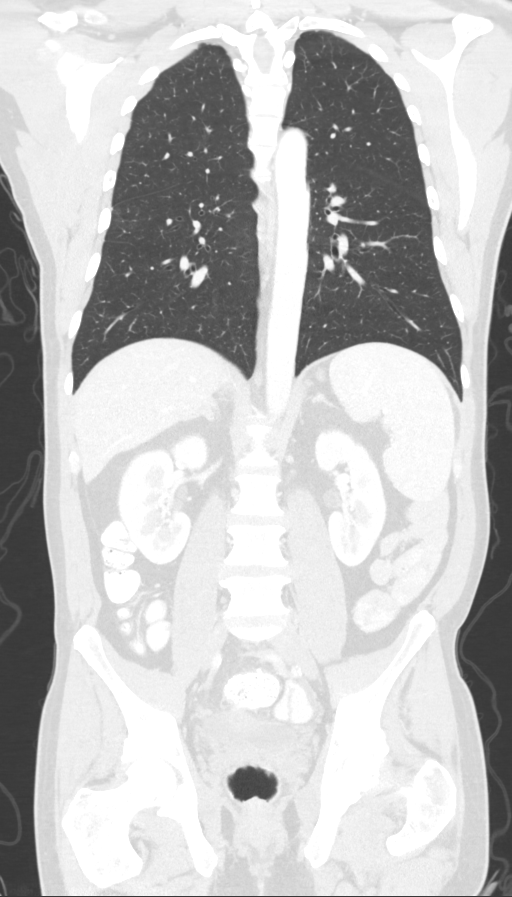

[Series 6: lung · axial · 0.73mm/px · 1 of 167 slices shown]
[im 13/167  lung]
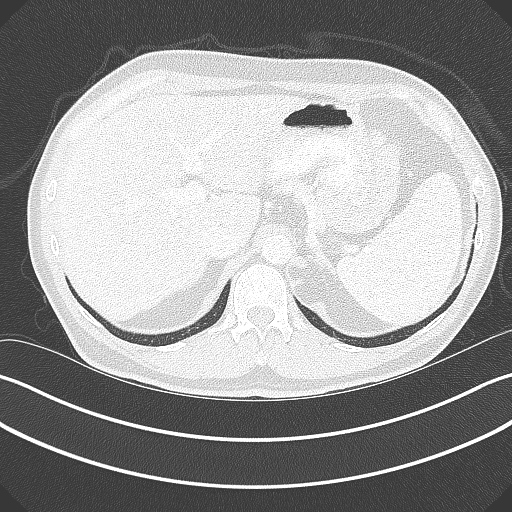

[13 of 36 positions shown; findings below may reference images not displayed]

FINDINGS: CT CHEST FINDINGS

Cardiovascular: Right Port-A-Cath tip: Cavoatrial junction. Mild
atherosclerotic calcification in the aortic arch.

Mediastinum/Nodes: Unremarkable

Lungs/Pleura: Biapical pleuroparenchymal scarring. Old granulomatous
disease. 3 by 4 mm left upper lobe nodule on image 88/6 is
chronically stable. Subsegmental atelectasis both lower lobes.

Musculoskeletal: Unremarkable

CT ABDOMEN PELVIS FINDINGS

Hepatobiliary: Unremarkable

Pancreas: Unremarkable

Spleen: Unremarkable

Adrenals/Urinary Tract: Unremarkable

Stomach/Bowel: Partial colectomy. Staple line appears normal in the
pelvis, image 106/2. There is some frothy fluid in the distal colon
and rectum, diarrheal process suspected.

Vascular/Lymphatic: Aortoiliac atherosclerotic vascular disease. No
pathologic adenopathy observed.

Reproductive: Unremarkable

Other: No supplemental non-categorized findings.

Musculoskeletal: Lumbar spondylosis and degenerative disc disease
causing impingement at L4-5 and L5-S1.
IMPRESSION: 1. No findings of recurrent malignancy.
2. Other imaging findings of potential clinical significance: Old
granulomatous disease. Lumbar spondylosis and degenerative disc
disease causing impingement at L4-5 and L5-S1.
3. Aortic atherosclerosis.

Aortic Atherosclerosis (BD1MO-PHO.O).

## 2021-03-26 IMAGING — CT CT CHEST W/ CM
3 of 5 series · 13 of 36 positions shown, 16 images · IV contrast (omnipaque)
Comparison: 01/20/2019

CLINICAL DATA: Sigmoid colon cancer diagnosed November 2018, surgically
treated and February 2019. Chemotherapy, radiation therapy, and
immunotherapy completed.

EXAM:
CT CHEST, ABDOMEN, AND PELVIS WITH CONTRAST
TECHNIQUE: Multidetector CT imaging of the chest, abdomen and pelvis was
performed following the standard protocol during bolus
administration of intravenous contrast.
CONTRAST:  100mL OMNIPAQUE IOHEXOL 300 MG/ML  SOLN

[Series 2: cap with · axial · 0.73mm/px · z∈[+1024,+1579]mm · 9 of 139 slices shown, 12 images]
[im 14/139  mediastinal]
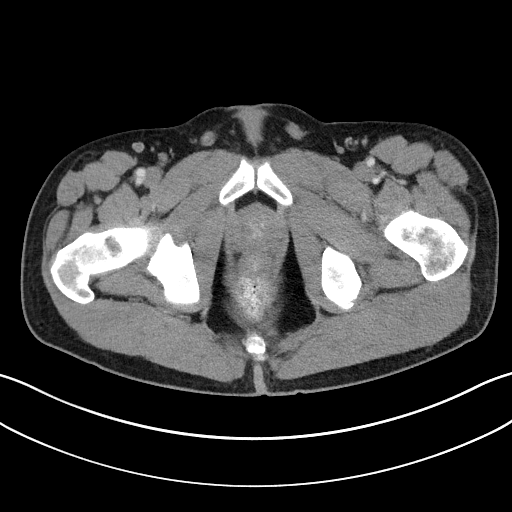
[im 14/139  lung]
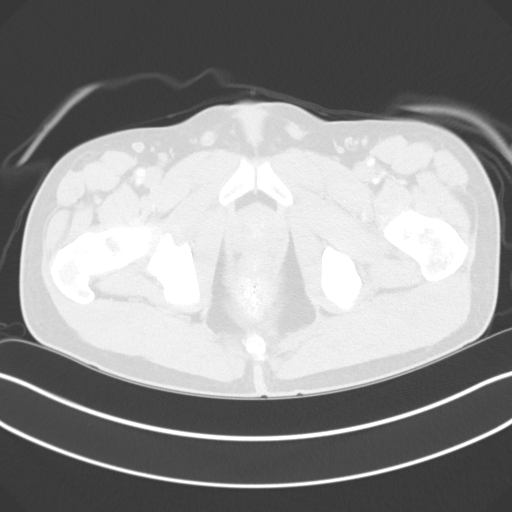
[im 28/139  lung]
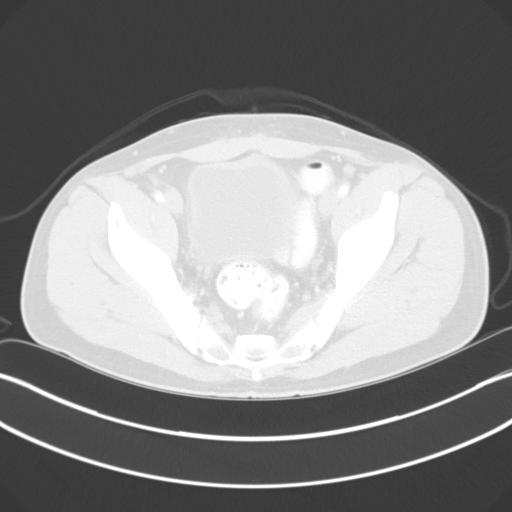
[im 42/139  lung]
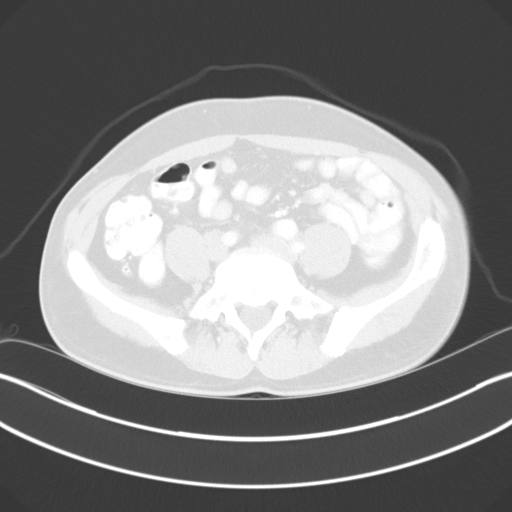
[im 56/139  lung]
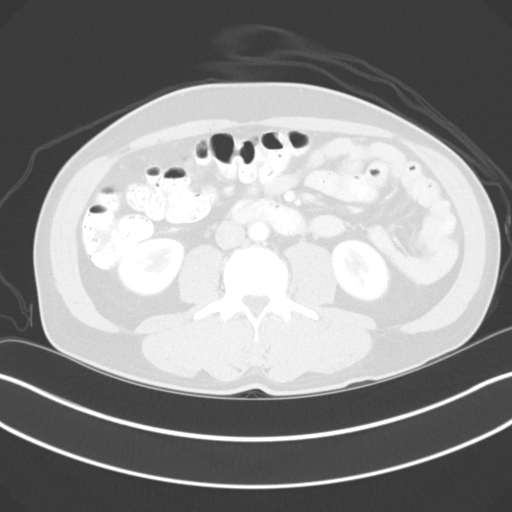
[im 70/139  mediastinal]
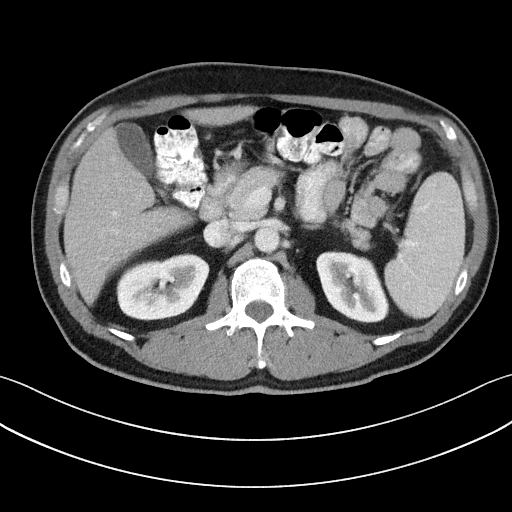
[im 70/139  lung]
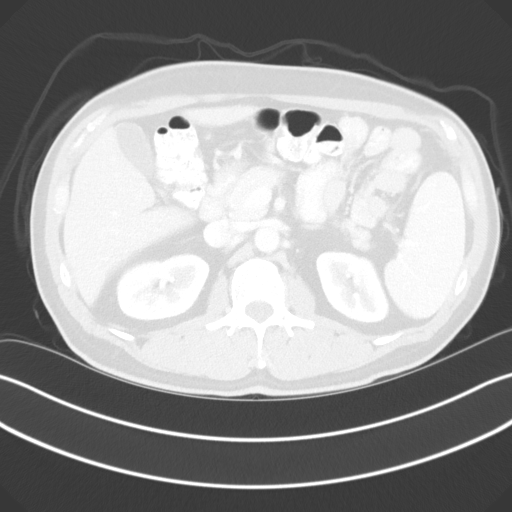
[im 83/139  lung]
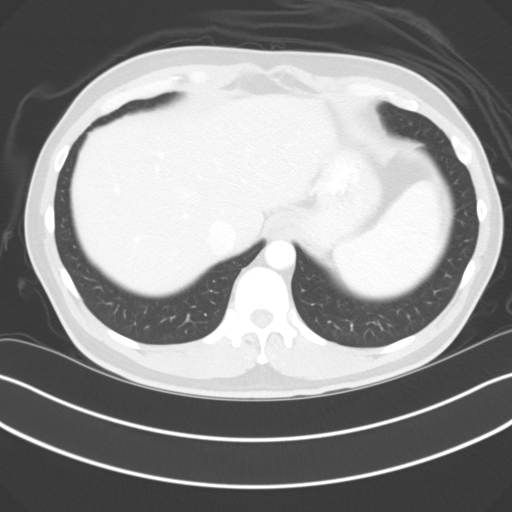
[im 97/139  lung]
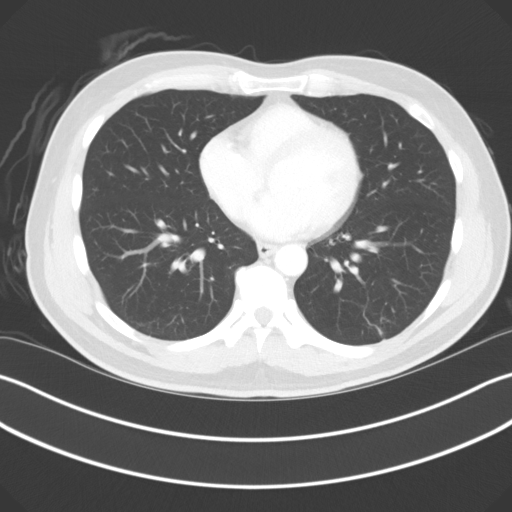
[im 111/139  lung]
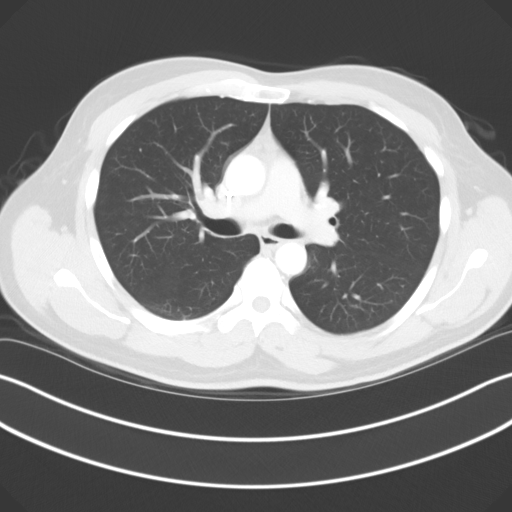
[im 125/139  mediastinal]
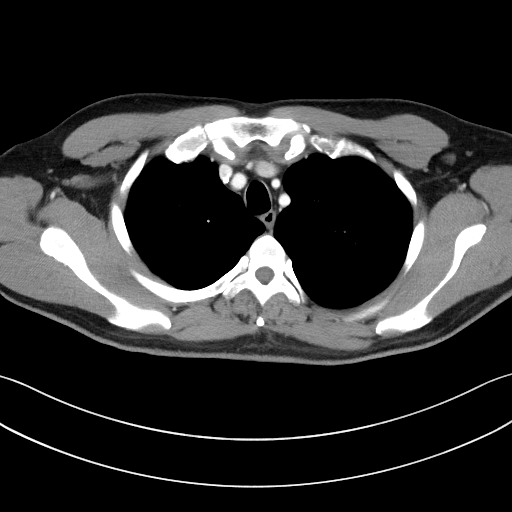
[im 125/139  lung]
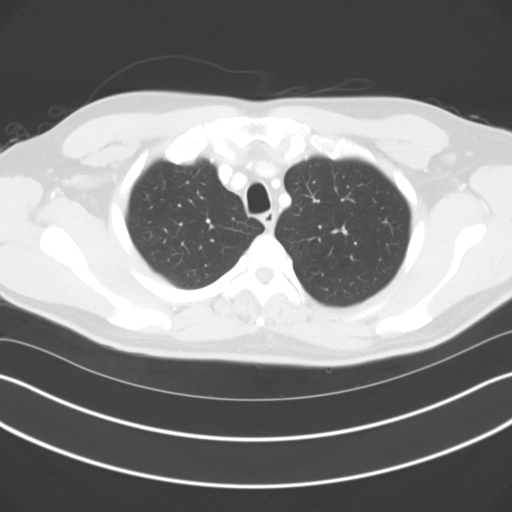

[Series 4: coronals · coronal · 0.77mm/px · 3 of 128 slices shown]
[im 26/128  lung]
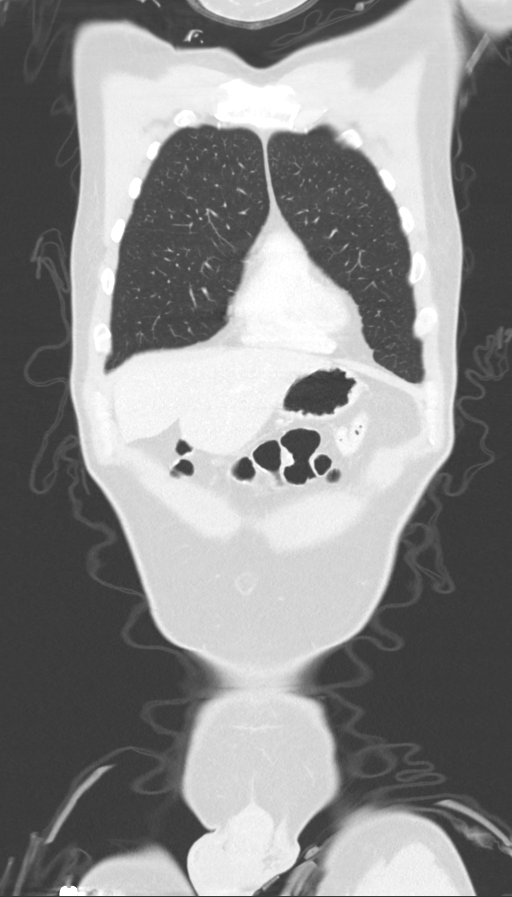
[im 51/128  lung]
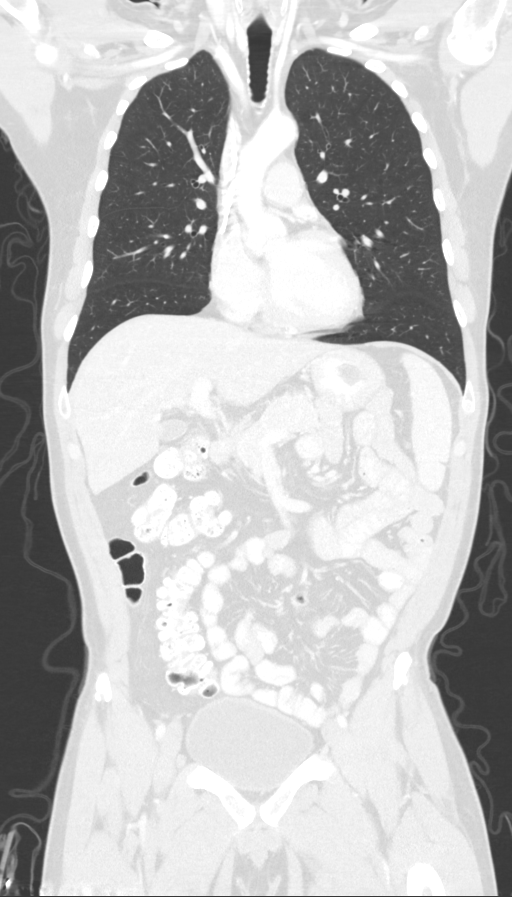
[im 77/128  lung]
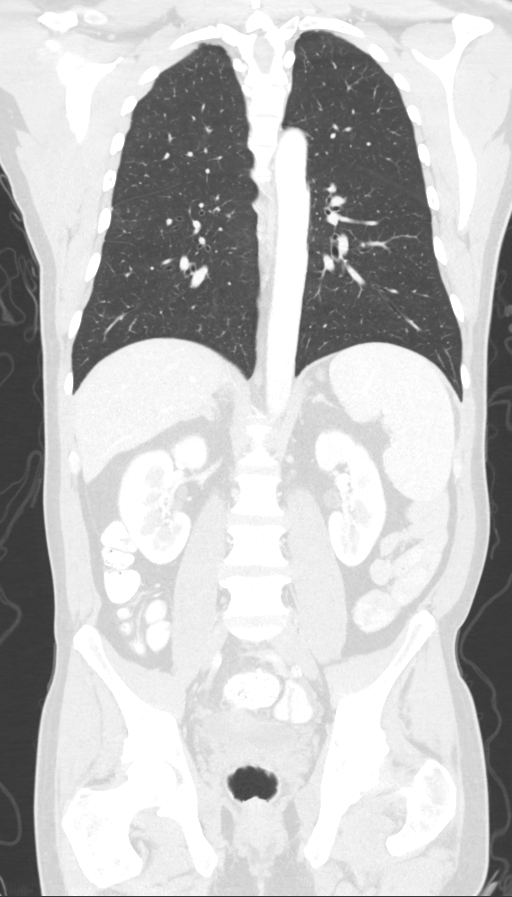

[Series 6: lung · axial · 0.73mm/px · 1 of 167 slices shown]
[im 13/167  lung]
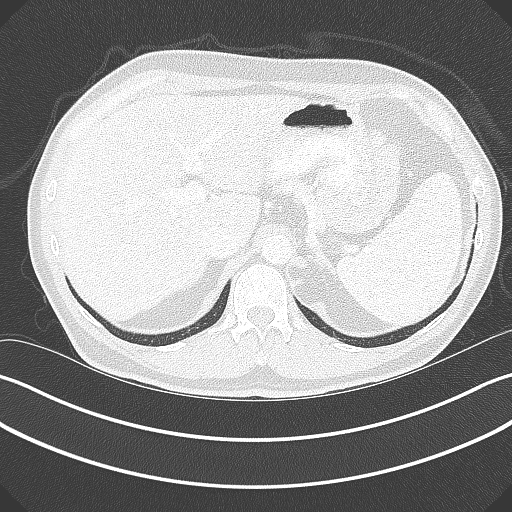

[13 of 36 positions shown; findings below may reference images not displayed]

FINDINGS: CT CHEST FINDINGS

Cardiovascular: Right Port-A-Cath tip: Cavoatrial junction. Mild
atherosclerotic calcification in the aortic arch.

Mediastinum/Nodes: Unremarkable

Lungs/Pleura: Biapical pleuroparenchymal scarring. Old granulomatous
disease. 3 by 4 mm left upper lobe nodule on image 88/6 is
chronically stable. Subsegmental atelectasis both lower lobes.

Musculoskeletal: Unremarkable

CT ABDOMEN PELVIS FINDINGS

Hepatobiliary: Unremarkable

Pancreas: Unremarkable

Spleen: Unremarkable

Adrenals/Urinary Tract: Unremarkable

Stomach/Bowel: Partial colectomy. Staple line appears normal in the
pelvis, image 106/2. There is some frothy fluid in the distal colon
and rectum, diarrheal process suspected.

Vascular/Lymphatic: Aortoiliac atherosclerotic vascular disease. No
pathologic adenopathy observed.

Reproductive: Unremarkable

Other: No supplemental non-categorized findings.

Musculoskeletal: Lumbar spondylosis and degenerative disc disease
causing impingement at L4-5 and L5-S1.
IMPRESSION: 1. No findings of recurrent malignancy.
2. Other imaging findings of potential clinical significance: Old
granulomatous disease. Lumbar spondylosis and degenerative disc
disease causing impingement at L4-5 and L5-S1.
3. Aortic atherosclerosis.

Aortic Atherosclerosis (BD1MO-PHO.O).

## 2021-06-07 ENCOUNTER — Other Ambulatory Visit: Payer: BC Managed Care – PPO

## 2021-06-07 ENCOUNTER — Ambulatory Visit: Payer: BC Managed Care – PPO | Admitting: Nurse Practitioner

## 2021-06-09 ENCOUNTER — Telehealth: Payer: Self-pay

## 2021-06-09 ENCOUNTER — Other Ambulatory Visit (HOSPITAL_BASED_OUTPATIENT_CLINIC_OR_DEPARTMENT_OTHER): Payer: Self-pay

## 2021-06-09 ENCOUNTER — Encounter: Payer: Self-pay | Admitting: Oncology

## 2021-06-09 ENCOUNTER — Encounter: Payer: Self-pay | Admitting: Nurse Practitioner

## 2021-06-09 ENCOUNTER — Inpatient Hospital Stay (HOSPITAL_BASED_OUTPATIENT_CLINIC_OR_DEPARTMENT_OTHER): Payer: BC Managed Care – PPO | Admitting: Nurse Practitioner

## 2021-06-09 ENCOUNTER — Other Ambulatory Visit: Payer: Self-pay

## 2021-06-09 ENCOUNTER — Inpatient Hospital Stay: Payer: BC Managed Care – PPO | Attending: Oncology

## 2021-06-09 VITALS — BP 127/67 | HR 62 | Temp 98.7°F | Resp 18 | Ht 67.0 in | Wt 182.6 lb

## 2021-06-09 DIAGNOSIS — D696 Thrombocytopenia, unspecified: Secondary | ICD-10-CM | POA: Diagnosis not present

## 2021-06-09 DIAGNOSIS — C187 Malignant neoplasm of sigmoid colon: Secondary | ICD-10-CM | POA: Diagnosis present

## 2021-06-09 DIAGNOSIS — G479 Sleep disorder, unspecified: Secondary | ICD-10-CM | POA: Insufficient documentation

## 2021-06-09 DIAGNOSIS — Z79899 Other long term (current) drug therapy: Secondary | ICD-10-CM | POA: Insufficient documentation

## 2021-06-09 DIAGNOSIS — E119 Type 2 diabetes mellitus without complications: Secondary | ICD-10-CM | POA: Diagnosis not present

## 2021-06-09 DIAGNOSIS — R42 Dizziness and giddiness: Secondary | ICD-10-CM | POA: Diagnosis not present

## 2021-06-09 DIAGNOSIS — Z9049 Acquired absence of other specified parts of digestive tract: Secondary | ICD-10-CM | POA: Insufficient documentation

## 2021-06-09 LAB — CEA (ACCESS): CEA (CHCC): 1.32 ng/mL (ref 0.00–5.00)

## 2021-06-09 MED ORDER — FLUARIX QUADRIVALENT 0.5 ML IM SUSY
PREFILLED_SYRINGE | INTRAMUSCULAR | 0 refills | Status: DC
  Filled 2021-06-09: qty 0.5, 1d supply, fill #0

## 2021-06-09 NOTE — Telephone Encounter (Signed)
Pt verbalized understanding.

## 2021-06-09 NOTE — Telephone Encounter (Signed)
-----   Message from Owens Shark, NP sent at 06/09/2021  3:54 PM EST ----- Please let him know the CEA is normal.  Follow-up as scheduled.

## 2021-06-09 NOTE — Progress Notes (Signed)
  [place] OFFICE PROGRESS NOTE   Diagnosis: Colon cancer  INTERVAL HISTORY:   Oscar Mccarty returns as scheduled.  No change in bowel habits.  No bleeding with bowel movements.  He denies abdominal pain.  He has a good appetite.  He intermittently notes difficulty sleeping.  He thinks this may be related to drinking caffeine in the afternoons.  He has intermittent dizziness after taking metformin.  For the past 6 to 8 months he has periodically noted low back pain when he lifts something at work.  The pain is relieved with Aleve.  Objective:  Vital signs in last 24 hours:  Blood pressure 127/67, pulse 62, temperature 98.7 F (37.1 C), temperature source Oral, resp. rate 18, height $RemoveBe'5\' 7"'HOoRHJfeC$  (1.702 m), weight 182 lb 9.6 oz (82.8 kg), SpO2 100 %.    HEENT: Neck without mass. Lymphatics: No palpable cervical, supraclavicular, axillary or inguinal lymph nodes. Resp: Lungs clear bilaterally. Cardio: Regular rate and rhythm. GI: Abdomen soft and nontender.  No hepatosplenomegaly.  No mass. Vascular: No leg edema.   Lab Results:  Lab Results  Component Value Date   WBC 3.5 (L) 11/26/2019   HGB 14.5 11/26/2019   HCT 43.8 11/26/2019   MCV 85.0 11/26/2019   PLT 106 (L) 11/26/2019   NEUTROABS 1.8 11/26/2019    Imaging:  No results found.  Medications: I have reviewed the patient's current medications.  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 Microsatellite stable, no loss of mismatch repair protein expression CTs 12/24/2018- sigmoid: Wall thickening, multiple enlarged nodes adjacent to the sigmoid colon, no evidence of metastatic disease Cycle 1 FOLFOX 04/14/2019 Cycle 2 FOLFOX 04/30/2019, Udenyca added  Cycle 3 FOLFOX 05/14/2019 (oxaliplatin dose reduced to 65 mg/m due to thrombocytopenia) Cycle 4 FOLFOX 05/27/2019 (continue oxaliplatin dose reduction to $RemoveBefo'65mg'KkYwNOdhOfh$ /m2, PLT 129K) Cycle 5 FOLFOX  06/10/2019 Cycle 6 FOLFOX 06/24/2019 Cycle 7 FOLFOX 07/08/2019  Cycle 8 FOLFOX 07/22/2019 Cycle 9 FOLFOX 08/05/2019 Cycle 10 FOLFOX 08/19/2019 Cycle 11 FOLFOX 09/02/2019 Cycle 12 FOLFOX 09/16/2019 CTs 11/26/2019-negative Colonoscopy 05/16/2020-patent end-to-end colocolonic anastomosis, characterized by healthy-appearing mucosa; no recurrence; next colonoscopy 3 years for surveillance CTs 12/02/2020-negative COVID-19 respiratory infection June 2020 Diabetes diagnosed June 2020 Port-A-Cath placement, Dr. Johney Maine, 04/09/2019; Port-A-Cath removed 12/10/2019 5. Heartburn following chemotherapy, prilosec added 06/10/19 6. Periodic dizziness on standing, no significant orthostatic hypotension. Hold lisinopril as of 06/24/19  7.  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  We will follow-up on the CEA from today.  Plan for surveillance CT scans in 6 months.  He will try to eliminate caffeine to see if this helps with his sleep.  He will follow-up with his PCP regarding dizziness after taking metformin, periodic low back pain with lifting.  He will return for lab and CTs in 6 months, office visit a few days later to review results.  Ned Card ANP/GNP-BC   06/09/2021  9:26 AM

## 2021-11-06 ENCOUNTER — Encounter: Payer: Self-pay | Admitting: *Deleted

## 2021-11-06 NOTE — Progress Notes (Signed)
Unable to reach patient x 2 re: CT scan. Printed off appointment with scan instructions and mailed to home. ? ?

## 2021-11-08 ENCOUNTER — Telehealth: Payer: Self-pay | Admitting: Oncology

## 2021-11-08 NOTE — Telephone Encounter (Signed)
Attempted to contact patient to reschedule appt on 5/22, Dr. Benay Spice will be coming in later that day. No answer and voicemail was unable to be left. ?

## 2021-12-05 ENCOUNTER — Encounter: Payer: Self-pay | Admitting: Oncology

## 2021-12-07 ENCOUNTER — Ambulatory Visit (HOSPITAL_BASED_OUTPATIENT_CLINIC_OR_DEPARTMENT_OTHER): Payer: BC Managed Care – PPO

## 2021-12-07 ENCOUNTER — Other Ambulatory Visit: Payer: BC Managed Care – PPO

## 2021-12-08 ENCOUNTER — Inpatient Hospital Stay: Payer: BC Managed Care – PPO | Attending: Oncology

## 2021-12-08 ENCOUNTER — Ambulatory Visit (HOSPITAL_BASED_OUTPATIENT_CLINIC_OR_DEPARTMENT_OTHER)
Admission: RE | Admit: 2021-12-08 | Discharge: 2021-12-08 | Disposition: A | Discharge status: Home / Self Care | Payer: BC Managed Care – PPO | Source: Ambulatory Visit | Attending: Nurse Practitioner | Admitting: Nurse Practitioner

## 2021-12-08 DIAGNOSIS — Z9221 Personal history of antineoplastic chemotherapy: Secondary | ICD-10-CM | POA: Diagnosis not present

## 2021-12-08 DIAGNOSIS — C187 Malignant neoplasm of sigmoid colon: Secondary | ICD-10-CM | POA: Diagnosis not present

## 2021-12-08 LAB — BUN & CREATININE (CHCC)
BUN: 10 mg/dL (ref 6–20)
Creatinine: 0.78 mg/dL (ref 0.61–1.24)
GFR, Estimated: >60 mL/min (ref >60)

## 2021-12-08 LAB — CEA (ACCESS): CEA (CHCC): 1.36 ng/mL (ref 0.00–5.00)

## 2021-12-08 MED ORDER — IOHEXOL 300 MG/ML  SOLN
100.0000 mL | Freq: Once | INTRAMUSCULAR | Status: AC | PRN
  Administered 2021-12-08: 100 mL via INTRAVENOUS

## 2021-12-11 ENCOUNTER — Ambulatory Visit: Payer: BC Managed Care – PPO | Admitting: Oncology

## 2021-12-14 ENCOUNTER — Encounter: Payer: Self-pay | Admitting: Oncology

## 2021-12-14 ENCOUNTER — Inpatient Hospital Stay: Payer: BC Managed Care – PPO | Admitting: Oncology

## 2021-12-14 VITALS — BP 129/71 | HR 60 | Temp 98.2°F | Resp 18 | Ht 67.0 in | Wt 180.2 lb

## 2021-12-14 DIAGNOSIS — C187 Malignant neoplasm of sigmoid colon: Secondary | ICD-10-CM

## 2021-12-14 NOTE — Progress Notes (Signed)
  [place] OFFICE PROGRESS NOTE   Diagnosis: Colon cancer  INTERVAL HISTORY:   Oscar Mccarty returns as scheduled.  He feels well.  Good appetite.  He is working.  No difficulty with bowel function.  No bleeding.  No neuropathy symptoms.  He had transient discomfort at the right anterior pelvic brim after repetitive motion at work.  This has resolved.  Objective:  Vital signs in last 24 hours:  Blood pressure 129/71, pulse 60, temperature 98.2 F (36.8 C), temperature source Oral, resp. rate 18, height _0  (1.702 m), weight 180 lb 3.2 oz (81.7 kg), SpO2 98 %.     Lymphatics: No cervical, supraclavicular, axillary, or inguinal nodes Resp: Lungs clear bilaterally Cardio: Regular rate and rhythm GI: No hepatosplenomegaly, no mass, nontender Vascular: No leg edema   Lab Results:  Lab Results  Component Value Date   WBC 3.5 (L) 11/26/2019   HGB 14.5 11/26/2019   HCT 43.8 11/26/2019   MCV 85.0 11/26/2019   PLT 106 (L) 11/26/2019   NEUTROABS 1.8 11/26/2019    CMP  Lab Results  Component Value Date   NA 139 12/02/2020   K 4.4 12/02/2020   CL 106 12/02/2020   CO2 27 12/02/2020   GLUCOSE 120 (H) 12/02/2020   BUN 10 12/08/2021   CREATININE 0.78 12/08/2021   CALCIUM 9.3 12/02/2020   PROT 7.6 11/26/2019   ALBUMIN 4.3 11/26/2019   AST 29 11/26/2019   ALT 36 11/26/2019   ALKPHOS 100 11/26/2019   BILITOT 0.6 11/26/2019   GFRNONAA >60 12/08/2021   GFRAA >60 11/26/2019    Lab Results  Component Value Date   CEA1 2.41 12/02/2020   CEA 1.36 12/08/2021    No results found for: INR, LABPROT  Imaging:  No results found.  Medications: I have reviewed the patient's current medications.   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 Microsatellite stable, no loss of mismatch repair protein expression CTs 12/24/2018- sigmoid: Wall thickening, multiple enlarged nodes adjacent to the sigmoid  colon, no evidence of metastatic disease Cycle 1 FOLFOX 04/14/2019 Cycle 2 FOLFOX 04/30/2019, Udenyca added  Cycle 3 FOLFOX 05/14/2019 (oxaliplatin dose reduced to 65 mg/m due to thrombocytopenia) Cycle 4 FOLFOX 05/27/2019 (continue oxaliplatin dose reduction to 74m/m2, PLT 129K) Cycle 5 FOLFOX 06/10/2019 Cycle 6 FOLFOX 06/24/2019 Cycle 7 FOLFOX 07/08/2019  Cycle 8 FOLFOX 07/22/2019 Cycle 9 FOLFOX 08/05/2019 Cycle 10 FOLFOX 08/19/2019 Cycle 11 FOLFOX 09/02/2019 Cycle 12 FOLFOX 09/16/2019 CTs 11/26/2019-negative Colonoscopy 05/16/2020-patent end-to-end colocolonic anastomosis, characterized by healthy-appearing mucosa; no recurrence; next colonoscopy 3 years for surveillance CTs 12/02/2020-negative CTs 12/08/2021-negative COVID-19 respiratory infection June 2020 Diabetes diagnosed June 2020 Port-A-Cath placement, Dr. GJohney Maine 04/09/2019; Port-A-Cath removed 12/10/2019 5. Heartburn following chemotherapy, prilosec added 06/10/19 6. Periodic dizziness on standing, no significant orthostatic hypotension. Hold lisinopril as of 06/24/19  7.  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 DGodinhoremains in clinical remission from colon cancer.  He will return for an office visit and CEA in 6 months.  He will be due for a colonoscopy in 2024.  Oscar Coder MD  12/14/2021  8:17 AM

## 2021-12-17 ENCOUNTER — Encounter: Payer: Self-pay | Admitting: Oncology

## 2021-12-19 ENCOUNTER — Encounter: Payer: Self-pay | Admitting: Oncology

## 2021-12-22 ENCOUNTER — Encounter: Payer: Self-pay | Admitting: Oncology

## 2022-02-12 ENCOUNTER — Other Ambulatory Visit: Payer: Self-pay

## 2022-06-18 ENCOUNTER — Inpatient Hospital Stay: Payer: BC Managed Care – PPO | Attending: Oncology

## 2022-06-18 ENCOUNTER — Encounter: Payer: Self-pay | Admitting: Nurse Practitioner

## 2022-06-18 ENCOUNTER — Inpatient Hospital Stay: Payer: BC Managed Care – PPO

## 2022-06-18 ENCOUNTER — Inpatient Hospital Stay (HOSPITAL_BASED_OUTPATIENT_CLINIC_OR_DEPARTMENT_OTHER): Payer: BC Managed Care – PPO | Admitting: Nurse Practitioner

## 2022-06-18 VITALS — BP 131/87 | HR 54 | Temp 98.2°F | Resp 18 | Ht 67.0 in | Wt 177.8 lb

## 2022-06-18 DIAGNOSIS — C187 Malignant neoplasm of sigmoid colon: Secondary | ICD-10-CM

## 2022-06-18 DIAGNOSIS — Z23 Encounter for immunization: Secondary | ICD-10-CM | POA: Insufficient documentation

## 2022-06-18 DIAGNOSIS — Z85038 Personal history of other malignant neoplasm of large intestine: Secondary | ICD-10-CM | POA: Insufficient documentation

## 2022-06-18 LAB — CEA (ACCESS): CEA (CHCC): 1.16 ng/mL (ref 0.00–5.00)

## 2022-06-18 MED ORDER — INFLUENZA VAC A&B SA ADJ QUAD 0.5 ML IM PRSY: 0.5000 mL | PREFILLED_SYRINGE | Freq: Once | INTRAMUSCULAR | Status: DC

## 2022-06-18 MED ORDER — INFLUENZA VAC SPLIT QUAD 0.5 ML IM SUSY
0.5000 mL | PREFILLED_SYRINGE | Freq: Once | INTRAMUSCULAR | Status: AC
  Administered 2022-06-18: 0.5 mL via INTRAMUSCULAR

## 2022-06-18 NOTE — Patient Instructions (Signed)
Gripe en los adultos Influenza, Adult A la gripe tambin se la conoce como "influenza". Es una [employer], la nariz y la garganta (vas respiratorias). Se transmite fcilmente de persona a persona (es contagiosa). La gripe causa sntomas que son [employer] de un resfro, junto con fiebre alta y dolores corporales. Cules son las causas? La causa de esta afeccin es el virus de la influenza. Puede contraer el virus de las siguientes maneras: Al inhalar gotitas que quedan en el aire despus de que una persona infectada con gripe tosi o estornud. Al tocar algo que est contaminado con el virus y [employer] mano a la boca, la nariz o los ojos. Qu incrementa el riesgo? Hay ciertas cosas que lo pueden hacer ms propenso a Nurse, adult. Estas incluyen lo siguiente: No lavarse las manos con frecuencia. Tener contacto cercano con [employer] durante la temporada de resfro y gripe. Tocarse la boca, los ojos o la nariz sin antes lavarse las manos. No recibir la [employer]. Puede correr un mayor riesgo de [place] gripe, y [place] graves, como una infeccin pulmonar (neumona), si usted: Es mayor de 39 aos de edad. Est embarazada. Tiene debilitado el sistema que combate las defensas (sistema inmunitario) debido a una enfermedad o a que toma determinados medicamentos. Tiene una afeccin a largo plazo (crnica), como las siguientes: Enfermedad cardaca, renal o pulmonar. Diabetes. Asma. Tiene un trastorno heptico. Tiene mucho sobrepeso (obesidad [place]). Tiene anemia. Cules son los signos o sntomas? Los sntomas normalmente comienzan de repente y [place] [address] y [address] Pueden incluir los siguientes: Cristy Hilts y [place]. Dolores de [place], dolores en el cuerpo o dolores musculares. Dolor de Investment banker, operational. Tos. Secrecin o congestin nasal. Molestias en el pecho. No querer comer tanto como lo hace normalmente. Sensacin de debilidad o  cansancio. Mareos. Malestar estomacal o vmitos. Cmo se trata? Si la gripe se detecta de forma temprana, puede recibir tratamiento con medicamentos antivirales. Esto puede ayudar a reducir la gravedad y la duracin de la enfermedad. Se los administrarn por boca o a travs de un tubo (catter) intravenoso. Cuidarse en su hogar puede ayudar a que mejoren los sntomas. El mdico puede recomendarle lo siguiente: Tomar medicamentos de Radio broadcast assistant. Beber abundante lquido. La gripe suele desaparecer sola. Si tiene sntomas muy graves u otros problemas, puede recibir tratamiento en un hospital. Siga estas instrucciones en su casa:     Actividad Descanse todo lo que sea necesario. Duerma lo suficiente. Foy [place] en su casa y no concurra al [place] o a la escuela, como se lo haya indicado el mdico. No salga de su casa hasta que no haya tenido fiebre por 24 horas sin tomar medicamentos. Salga de su casa solamente para ir al [employer]. Comida y bebida Wyatt Haste SRO (solucin de rehidratacin oral). Es Ardelia Mems bebida que se vende en farmacias y tiendas. Beba suficiente lquido como para Theatre manager la orina de color amarillo plido. En la medida en que pueda, beba lquidos transparentes en pequeas cantidades. Los lquidos transparentes son, por ejemplo: Grayce Sessions. Trocitos de hielo. Jugo de frutas mezclado con agua. Bebidas deportivas de bajas caloras. Coma alimentos suaves que sean fciles de digerir. En la medida que pueda, consuma pequeas cantidades. Estos alimentos incluyen: Bananas. Pur de [employer]. Arroz. Carnes magras. Tostadas. Galletas. No coma ni beba lo siguiente: Lquidos con alto contenido de azcar o cafena. Alcohol. Alimentos condimentados o con alto contenido de [place]. Indicaciones generales Use los medicamentos de venta libre y los  recetados solamente como se lo haya indicado el mdico. Use un humidificador de aire fro para que el aire de su casa est ms hmedo. Esto puede  facilitar la respiracin. Cuando utilice un humidificador de vapor fro, lmpielo a diario. Vace el agua y [place] por agua limpia. Al toser o estornudar, cbrase la boca y la [place]. Lvese las manos frecuentemente con agua y [place] y durante al menos 20 segundos. Esto tambin es importante despus de toser o Brewing technologist. Si no dispone de [place] y [place], use desinfectante para manos con alcohol. Cumpla con todas las visitas de seguimiento. Cmo se previene?  Colquese la vacuna antigripal todos los [place]. Puede colocarse la vacuna contra la gripe a fines de verano, en otoo o en invierno. Pregntele al mdico cundo debe aplicarse la vacuna contra la gripe. Evite el contacto con personas que estn enfermas durante el otoo y el invierno. Es la temporada del resfro y Counsellor. Comunquese con un mdico si: Tiene sntomas nuevos. Tiene los siguientes sntomas: Dolor de [place]. Materia fecal lquida (diarrea). Cristy Hilts. La tos empeora. Empieza a tener ms mucosidad. Tiene Higher education careers adviser. Vomita. Solicite ayuda de inmediato si: Le falta el aire. Tiene dificultad para respirar. La piel o las uas se ponen de un color azulado. Presenta dolor muy intenso o rigidez en el cuello. Tiene dolor de cabeza repentino. Le duele la cara o el odo de forma repentina. No puede comer ni beber sin vomitar. Estos sntomas pueden representar un problema grave que constituye Engineer, maintenance (IT). Solicite atencin mdica de inmediato. Comunquese con el servicio de emergencias de su localidad (911 en los [place]). No espere a ver si los sntomas desaparecen. No conduzca por sus propios medios [employer]. Resumen A la gripe tambin se la conoce como "influenza". Es una [employer], la nariz y Patent examiner. Se transmite fcilmente de [place] persona a otra. Use los medicamentos de venta libre y los recetados solamente como se lo haya indicado el mdico. Aplicarse la vacuna contra la gripe  todos los aos es la mejor manera de no contagiarse la gripe. Esta informacin no tiene Marine scientist el consejo del mdico. Asegrese de hacerle al mdico cualquier pregunta que tenga. Document Revised: 05/05/2020 Document Reviewed: 05/05/2020 Elsevier Patient Education  [place].

## 2022-06-18 NOTE — Addendum Note (Signed)
Addended by: Roselind Messier A on: 06/18/2022 08:56 AM   Modules accepted: Orders

## 2022-06-18 NOTE — Progress Notes (Signed)
  [place] OFFICE PROGRESS NOTE   Diagnosis: Colon cancer  INTERVAL HISTORY:   [name] returns as scheduled.  No change in bowel habits.  No blood or pain with bowel movements.  He has a good appetite.  He denies neuropathy symptoms.  Objective:  Vital signs in last 24 hours:  Blood pressure 131/87, pulse (!) 54, temperature 98.2 F (36.8 C), temperature source Oral, resp. rate 18, height _0  (1.702 m), weight 177 lb 12.8 oz (80.6 kg), SpO2 98 %.    Lymphatics: No palpable cervical, supraclavicular, axillary or inguinal lymph nodes. Resp: Lungs clear bilaterally. Cardio: Regular rate and rhythm. GI: Abdomen soft and nontender.  No hepatosplenomegaly.  No mass. Vascular: No leg edema.   Lab Results:  Lab Results  Component Value Date   WBC 3.5 (L) 11/26/2019   HGB 14.5 11/26/2019   HCT 43.8 11/26/2019   MCV 85.0 11/26/2019   PLT 106 (L) 11/26/2019   NEUTROABS 1.8 11/26/2019    Imaging:  No results found.  Medications: I have reviewed the patient's current medications.  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 Microsatellite stable, no loss of mismatch repair protein expression CTs 12/24/2018- sigmoid: Wall thickening, multiple enlarged nodes adjacent to the sigmoid colon, no evidence of metastatic disease Cycle 1 FOLFOX 04/14/2019 Cycle 2 FOLFOX 04/30/2019, Udenyca added  Cycle 3 FOLFOX 05/14/2019 (oxaliplatin dose reduced to 65 mg/m due to thrombocytopenia) Cycle 4 FOLFOX 05/27/2019 (continue oxaliplatin dose reduction to 72m/m2, PLT 129K) Cycle 5 FOLFOX 06/10/2019 Cycle 6 FOLFOX 06/24/2019 Cycle 7 FOLFOX 07/08/2019  Cycle 8 FOLFOX 07/22/2019 Cycle 9 FOLFOX 08/05/2019 Cycle 10 FOLFOX 08/19/2019 Cycle 11 FOLFOX 09/02/2019 Cycle 12 FOLFOX 09/16/2019 CTs 11/26/2019-negative Colonoscopy 05/16/2020-patent end-to-end colocolonic anastomosis, characterized by healthy-appearing mucosa;  no recurrence; next colonoscopy 3 years for surveillance CTs 12/02/2020-negative CTs 12/08/2021-negative COVID-19 respiratory infection June 2020 Diabetes diagnosed June 2020 Port-A-Cath placement, Dr. GJohney Maine 04/09/2019; Port-A-Cath removed 12/10/2019 5. Heartburn following chemotherapy, prilosec added 06/10/19 6. Periodic dizziness on standing, no significant orthostatic hypotension. Hold lisinopril as of 06/24/19  7.  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 DLindforsremains in clinical remission from colon cancer.  We will follow-up on the CEA from today.  Plan for surveillance CT scans in approximately 6 months.  Next colonoscopy October 2024.  He will return for a follow-up appointment in 6 months, a few days after CT scans.  He will receive the influenza vaccine today.    LNed CardANP/GNP-BC   06/18/2022  8:14 AM

## 2022-10-23 ENCOUNTER — Encounter: Payer: Self-pay | Admitting: Oncology

## 2022-12-13 ENCOUNTER — Encounter: Payer: Self-pay | Admitting: Oncology

## 2022-12-14 ENCOUNTER — Ambulatory Visit (HOSPITAL_BASED_OUTPATIENT_CLINIC_OR_DEPARTMENT_OTHER): Payer: BC Managed Care – PPO

## 2022-12-19 ENCOUNTER — Ambulatory Visit (HOSPITAL_BASED_OUTPATIENT_CLINIC_OR_DEPARTMENT_OTHER)
Admission: RE | Admit: 2022-12-19 | Discharge: 2022-12-19 | Disposition: A | Discharge status: Home / Self Care | Payer: BC Managed Care – PPO | Source: Ambulatory Visit | Attending: Nurse Practitioner | Admitting: Nurse Practitioner

## 2022-12-19 ENCOUNTER — Inpatient Hospital Stay: Payer: BC Managed Care – PPO | Attending: Oncology

## 2022-12-19 DIAGNOSIS — Z85038 Personal history of other malignant neoplasm of large intestine: Secondary | ICD-10-CM | POA: Insufficient documentation

## 2022-12-19 DIAGNOSIS — C187 Malignant neoplasm of sigmoid colon: Secondary | ICD-10-CM

## 2022-12-19 LAB — BASIC METABOLIC PANEL - CANCER CENTER ONLY
Anion gap: 5 (ref 5–15)
BUN: 14 mg/dL (ref 6–20)
CO2: 26 mmol/L (ref 22–32)
Calcium: 8.5 mg/dL — ABNORMAL LOW (ref 8.9–10.3)
Chloride: 106 mmol/L (ref 98–111)
Creatinine: 0.71 mg/dL (ref 0.61–1.24)
GFR, Estimated: >60 mL/min (ref >60)
Glucose, Bld: 131 mg/dL — ABNORMAL HIGH (ref 70–99)
Potassium: 4.1 mmol/L (ref 3.5–5.1)
Sodium: 137 mmol/L (ref 135–145)

## 2022-12-19 LAB — CEA (ACCESS): CEA (CHCC): 1.06 ng/mL (ref 0.00–5.00)

## 2022-12-19 MED ORDER — IOHEXOL 300 MG/ML  SOLN
100.0000 mL | Freq: Once | INTRAMUSCULAR | Status: AC | PRN
  Administered 2022-12-19: 85 mL via INTRAVENOUS

## 2022-12-21 ENCOUNTER — Telehealth: Payer: Self-pay

## 2022-12-21 NOTE — Telephone Encounter (Signed)
-----   Message from Ladene Artist, MD sent at 12/20/2022  7:23 PM EDT ----- Please call patient, CTs are negative for cancer, f/u as scheduled

## 2022-12-21 NOTE — Telephone Encounter (Signed)
A patient has requested to change their appointment scheduled for December 24, 2022. I attempted to contact the patient and left a message informing them that their CT scan results were negative for cancer. I advised them to call back in order to reschedule their follow-up appointment.

## 2022-12-24 ENCOUNTER — Inpatient Hospital Stay: Payer: BC Managed Care – PPO | Admitting: Oncology

## 2023-01-15 ENCOUNTER — Inpatient Hospital Stay: Payer: BC Managed Care – PPO | Attending: Oncology | Admitting: Oncology

## 2023-01-15 VITALS — BP 140/85 | HR 60 | Temp 98.1°F | Resp 18 | Ht 67.0 in | Wt 178.7 lb

## 2023-01-15 DIAGNOSIS — C187 Malignant neoplasm of sigmoid colon: Secondary | ICD-10-CM

## 2023-01-15 DIAGNOSIS — Z08 Encounter for follow-up examination after completed treatment for malignant neoplasm: Secondary | ICD-10-CM | POA: Diagnosis present

## 2023-01-15 DIAGNOSIS — Z85038 Personal history of other malignant neoplasm of large intestine: Secondary | ICD-10-CM | POA: Diagnosis not present

## 2023-01-15 NOTE — Progress Notes (Signed)
  [facility] OFFICE PROGRESS NOTE   Diagnosis: Colon cancer  INTERVAL HISTORY:   [name] returns as scheduled.  He feels well.  He is working.  Good appetite.  No difficulty with bowel function.  No neuropathy symptoms.  Objective:  Vital signs in last 24 hours:  Blood pressure (!) 140/85, pulse 60, temperature 98.1 F (36.7 C), temperature source Oral, resp. rate 18, height 5\' 7"  (1.702 m), weight 178 lb 11.2 oz (81.1 kg), SpO2 98 %.     Lymphatics: No cervical, supraclavicular, axillary, or inguinal nodes Resp: Lungs clear bilaterally Cardio: Regular rate and rhythm GI: No mass, nontender, no hepatosplenomegaly Vascular: No leg edema   Lab Results:  Lab Results  Component Value Date   WBC 3.5 (L) 11/26/2019   HGB 14.5 11/26/2019   HCT 43.8 11/26/2019   MCV 85.0 11/26/2019   PLT 106 (L) 11/26/2019   NEUTROABS 1.8 11/26/2019    CMP  Lab Results  Component Value Date   NA 137 12/19/2022   K 4.1 12/19/2022   CL 106 12/19/2022   CO2 26 12/19/2022   GLUCOSE 131 (H) 12/19/2022   BUN 14 12/19/2022   CREATININE 0.71 12/19/2022   CALCIUM 8.5 (L) 12/19/2022   PROT 7.6 11/26/2019   ALBUMIN 4.3 11/26/2019   AST 29 11/26/2019   ALT 36 11/26/2019   ALKPHOS 100 11/26/2019   BILITOT 0.6 11/26/2019   GFRNONAA >60 12/19/2022   GFRAA >60 11/26/2019    Lab Results  Component Value Date   CEA1 2.41 12/02/2020   CEA 1.06 12/19/2022     Medications: I have reviewed the patient's current medications.   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 Microsatellite stable, no loss of mismatch repair protein expression CTs 12/24/2018- sigmoid: Wall thickening, multiple enlarged nodes adjacent to the sigmoid colon, no evidence of metastatic disease Cycle 1 FOLFOX 04/14/2019 Cycle 2 FOLFOX 04/30/2019, Udenyca added  Cycle 3 FOLFOX 05/14/2019 (oxaliplatin dose reduced to 65 mg/m due to  thrombocytopenia) Cycle 4 FOLFOX 05/27/2019 (continue oxaliplatin dose reduction to 65mg /m2, PLT 129K) Cycle 5 FOLFOX 06/10/2019 Cycle 6 FOLFOX 06/24/2019 Cycle 7 FOLFOX 07/08/2019  Cycle 8 FOLFOX 07/22/2019 Cycle 9 FOLFOX 08/05/2019 Cycle 10 FOLFOX 08/19/2019 Cycle 11 FOLFOX 09/02/2019 Cycle 12 FOLFOX 09/16/2019 CTs 11/26/2019-negative Colonoscopy 05/16/2020-patent end-to-end colocolonic anastomosis, characterized by healthy-appearing mucosa; no recurrence; next colonoscopy 3 years for surveillance CTs 12/02/2020-negative CTs 12/08/2021-negative COVID-19 respiratory infection June 2020 Diabetes diagnosed June 2020 Port-A-Cath placement, Dr. Michaell Cowing, 04/09/2019; Port-A-Cath removed 12/10/2019 5. Heartburn following chemotherapy, prilosec added 06/10/19 6. Periodic dizziness on standing, no significant orthostatic hypotension. Hold lisinopril as of 06/24/19  7.  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  He will return for an office visit and CEA in 6 months.  He is due for a surveillance colonoscopy later this year.  Thornton Papas, MD  01/15/2023  11:42 AM

## 2023-05-29 ENCOUNTER — Encounter: Payer: Self-pay | Admitting: Gastroenterology

## 2023-07-11 ENCOUNTER — Inpatient Hospital Stay: Payer: BC Managed Care – PPO

## 2023-07-11 ENCOUNTER — Other Ambulatory Visit: Payer: BC Managed Care – PPO

## 2023-07-11 ENCOUNTER — Inpatient Hospital Stay: Payer: BC Managed Care – PPO | Attending: Oncology | Admitting: Oncology

## 2023-07-11 VITALS — BP 128/84 | HR 89 | Temp 98.2°F | Resp 18 | Ht 67.0 in | Wt 175.5 lb

## 2023-07-11 DIAGNOSIS — Z85038 Personal history of other malignant neoplasm of large intestine: Secondary | ICD-10-CM | POA: Diagnosis present

## 2023-07-11 DIAGNOSIS — Z23 Encounter for immunization: Secondary | ICD-10-CM | POA: Diagnosis present

## 2023-07-11 DIAGNOSIS — C187 Malignant neoplasm of sigmoid colon: Secondary | ICD-10-CM

## 2023-07-11 LAB — CEA (ACCESS): CEA (CHCC): 1.24 ng/mL (ref 0.00–5.00)

## 2023-07-11 MED ORDER — INFLUENZA VIRUS VACC SPLIT PF (FLUZONE) 0.5 ML IM SUSY
0.5000 mL | PREFILLED_SYRINGE | Freq: Once | INTRAMUSCULAR | Status: AC
  Administered 2023-07-11: 0.5 mL via INTRAMUSCULAR
  Filled 2023-07-11: qty 0.5

## 2023-07-11 NOTE — Progress Notes (Signed)
   [facility]   Diagnosis: Colon cancer  INTERVAL HISTORY:   [name] returns as scheduled.  He feels well.  Good appetite and energy level.  No complaint.  He is working.  He is scheduled for colonoscopy next month.  Objective:  Vital signs in last 24 hours:  Blood pressure 128/84, pulse 89, temperature 98.2 F (36.8 C), temperature source Temporal, resp. rate 18, height 5\' 7"  (1.702 m), weight 175 lb 8 oz (79.6 kg), SpO2 100%.    Lymphatics: No cervical, supraclavicular, axillary, or inguinal nodes Resp: Lungs clear bilaterally Cardio: Regular rate and rhythm GI: No hepatosplenomegaly, no mass, nontender Vascular: No leg edema   Lab Results:  Lab Results  Component Value Date   WBC 3.5 (L) 11/26/2019   HGB 14.5 11/26/2019   HCT 43.8 11/26/2019   MCV 85.0 11/26/2019   PLT 106 (L) 11/26/2019   NEUTROABS 1.8 11/26/2019    CMP  Lab Results  Component Value Date   NA 137 12/19/2022   K 4.1 12/19/2022   CL 106 12/19/2022   CO2 26 12/19/2022   GLUCOSE 131 (H) 12/19/2022   BUN 14 12/19/2022   CREATININE 0.71 12/19/2022   CALCIUM 8.5 (L) 12/19/2022   PROT 7.6 11/26/2019   ALBUMIN 4.3 11/26/2019   AST 29 11/26/2019   ALT 36 11/26/2019   ALKPHOS 100 11/26/2019   BILITOT 0.6 11/26/2019   GFRNONAA >60 12/19/2022   GFRAA >60 11/26/2019    Lab Results  Component Value Date   CEA1 2.41 12/02/2020   CEA 1.06 12/19/2022    No results found for: "INR", "LABPROT"  Imaging:  No results found.  Medications: I have reviewed the patient's current medications.   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 Microsatellite stable, no loss of mismatch repair protein expression CTs 12/24/2018- sigmoid: Wall thickening, multiple enlarged nodes adjacent to the sigmoid colon, no evidence of metastatic disease Cycle 1 FOLFOX 04/14/2019 Cycle 2 FOLFOX 04/30/2019, Udenyca  added  Cycle 3 FOLFOX 05/14/2019 (oxaliplatin dose reduced to 65 mg/m due to thrombocytopenia) Cycle 4 FOLFOX 05/27/2019 (continue oxaliplatin dose reduction to 65mg /m2, PLT 129K) Cycle 5 FOLFOX 06/10/2019 Cycle 6 FOLFOX 06/24/2019 Cycle 7 FOLFOX 07/08/2019  Cycle 8 FOLFOX 07/22/2019 Cycle 9 FOLFOX 08/05/2019 Cycle 10 FOLFOX 08/19/2019 Cycle 11 FOLFOX 09/02/2019 Cycle 12 FOLFOX 09/16/2019 CTs 11/26/2019-negative Colonoscopy 05/16/2020-patent end-to-end colocolonic anastomosis, characterized by healthy-appearing mucosa; no recurrence; next colonoscopy 3 years for surveillance CTs 12/02/2020-negative CTs 12/08/2021-negative CTs 12/19/2022-negative COVID-19 respiratory infection June 2020 Diabetes diagnosed June 2020 Port-A-Cath placement, Dr. Michaell Cowing, 04/09/2019; Port-A-Cath removed 12/10/2019 5. Heartburn following chemotherapy, prilosec added 06/10/19 6. Periodic dizziness on standing, no significant orthostatic hypotension. Hold lisinopril as of 06/24/19  7.  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 Puente is in clinical remission from colon cancer.  He will return for an office visit and CEA in 6 months.  He will undergo a surveillance colonoscopy next month.  He received an influenza vaccine today.  Thornton Papas, MD  07/11/2023  9:35 AM

## 2023-07-29 ENCOUNTER — Encounter: Payer: Self-pay | Admitting: Oncology

## 2023-08-20 ENCOUNTER — Encounter: Payer: BC Managed Care – PPO | Admitting: Gastroenterology

## 2023-08-26 ENCOUNTER — Encounter: Payer: Self-pay | Admitting: Oncology

## 2023-09-11 ENCOUNTER — Encounter: Payer: Self-pay | Admitting: Oncology

## 2023-09-16 ENCOUNTER — Encounter: Payer: Self-pay | Admitting: Oncology

## 2023-09-18 ENCOUNTER — Encounter: Payer: Self-pay | Admitting: Oncology

## 2023-09-18 ENCOUNTER — Ambulatory Visit (AMBULATORY_SURGERY_CENTER): Payer: BC Managed Care – PPO

## 2023-09-18 VITALS — Ht 67.0 in | Wt 177.0 lb

## 2023-09-18 DIAGNOSIS — Z85038 Personal history of other malignant neoplasm of large intestine: Secondary | ICD-10-CM

## 2023-09-18 MED ORDER — NA SULFATE-K SULFATE-MG SULF 17.5-3.13-1.6 GM/177ML PO SOLN: 1.0000 | Freq: Once | ORAL | 0 refills | Status: AC

## 2023-09-18 NOTE — Progress Notes (Signed)
 No egg or soy allergy known to patient  No issues known to pt with past sedation with any surgeries or procedures Patient denies ever being told they had issues or difficulty with intubation  No FH of Malignant Hyperthermia Pt is not on diet pills Pt is not on  home 02  Pt is not on blood thinners  Pt denies issues with constipation  No A fib or A flutter Have any cardiac testing pending-- no LOA: independent  Prep: suprep   Patient's chart reviewed by Cathlyn Parsons CNRA prior to previsit and patient appropriate for the [facility].  Previsit completed and red dot placed by patient's name on their procedure day (on provider's schedule).     PV completed with patient. Prep instructions sent via mychart and hard copy given at [facility]

## 2023-09-25 ENCOUNTER — Encounter: Payer: Self-pay | Admitting: Oncology

## 2023-09-26 ENCOUNTER — Encounter: Payer: Self-pay | Admitting: Gastroenterology

## 2023-10-02 ENCOUNTER — Ambulatory Visit: Payer: BC Managed Care – PPO | Admitting: Gastroenterology

## 2023-10-02 ENCOUNTER — Encounter: Payer: Self-pay | Admitting: Gastroenterology

## 2023-10-02 ENCOUNTER — Encounter: Payer: BC Managed Care – PPO | Admitting: Gastroenterology

## 2023-10-02 VITALS — BP 121/70 | HR 55 | Temp 97.7°F | Resp 15 | Ht 67.0 in | Wt 177.0 lb

## 2023-10-02 DIAGNOSIS — K640 First degree hemorrhoids: Secondary | ICD-10-CM | POA: Diagnosis not present

## 2023-10-02 DIAGNOSIS — Z85038 Personal history of other malignant neoplasm of large intestine: Secondary | ICD-10-CM

## 2023-10-02 DIAGNOSIS — Z1211 Encounter for screening for malignant neoplasm of colon: Secondary | ICD-10-CM | POA: Diagnosis present

## 2023-10-02 DIAGNOSIS — Z980 Intestinal bypass and anastomosis status: Secondary | ICD-10-CM | POA: Diagnosis not present

## 2023-10-02 MED ORDER — SODIUM CHLORIDE 0.9 % IV SOLN: 500.0000 mL | Freq: Once | INTRAVENOUS | Status: DC

## 2023-10-02 NOTE — Progress Notes (Signed)
 Sedate, gd SR, tolerated procedure well, VSS, report to RN

## 2023-10-02 NOTE — Op Note (Signed)
 [facility] Patient Name: Oscar Mccarty Procedure Date: 10/02/2023 9:58 AM MRN: [id number] Endoscopist: Lynann Bologna , MD, [id number] Age: 58 Referring MD:  Date of Birth: 05-02-1966 Gender: Male Account #: [id number] Procedure:                Colonoscopy Indications:              High risk colon cancer surveillance: Stage IIIb                            (T4N1M0) colon Ca s/p lap left-sided colectomy                            02/2019 Medicines:                Monitored Anesthesia Care Procedure:                Pre-Anesthesia Assessment:                           - Prior to the procedure, a History and Physical                            was performed, and patient medications and                            allergies were reviewed. The patient's tolerance of                            previous anesthesia was also reviewed. The risks                            and benefits of the procedure and the sedation                            options and risks were discussed with the patient.                            All questions were answered, and informed consent                            was obtained. Prior Anticoagulants: The patient has                            taken no anticoagulant or antiplatelet agents. ASA                            Grade Assessment: II - A patient with mild systemic                            disease. After reviewing the risks and benefits,                            the patient was deemed in satisfactory condition to  undergo the procedure.                           After obtaining informed consent, the colonoscope                            was passed under direct vision. Throughout the                            procedure, the patient's blood pressure, pulse, and                            oxygen saturations were monitored continuously. The                            [id number]-HQ190L Colonoscope [id number] was introduced                             through the anus and advanced to the 2 cm into the                            ileum. The colonoscopy was performed without                            difficulty. The patient tolerated the procedure                            well. The quality of the bowel preparation was                            good. The ileocecal valve, appendiceal orifice, and                            rectum were photographed. Scope In: 10:07:08 AM Scope Out: 10:16:43 AM Scope Withdrawal Time: 0 hours 7 minutes 3 seconds  Total Procedure Duration: 0 hours 9 minutes 35 seconds  Findings:                 There was evidence of a prior end-to-end                            colo-colonic anastomosis in the sigmoid colon, 20                            cm from the anal verge. This was patent and was                            characterized by healthy appearing mucosa.                           Non-bleeding internal hemorrhoids were found during                            retroflexion. The hemorrhoids were small and Grade  I (internal hemorrhoids that do not prolapse).                           The exam was otherwise without abnormality on                            direct and retroflexion views. Complications:            No immediate complications. Estimated Blood Loss:     Estimated blood loss: none. Impression:               - Patent end-to-end colo-colonic anastomosis,                            characterized by healthy appearing mucosa.                           - Minimal internal hemorrhoids.                           - The examination was otherwise normal on direct                            and retroflexion views.                           - No specimens collected. Recommendation:           - Patient has a contact number available for                            emergencies. The signs and symptoms of potential                            delayed complications were discussed with  the                            patient. Return to normal activities tomorrow.                            Written discharge instructions were provided to the                            patient.                           - Resume previous diet.                           - Continue present medications.                           - Repeat colonoscopy in 5 years for screening                            purposes.                           -  The findings and recommendations were discussed                            with the patient's family. Lynann Bologna, MD 10/02/2023 10:20:47 AM This report has been signed electronically.

## 2023-10-02 NOTE — Progress Notes (Signed)
 Pt's states no medical or surgical changes since previsit or office visit.

## 2023-10-02 NOTE — Progress Notes (Signed)
 Chief Complaint: FU  Referring Provider:  Nonnie Done., MD      ASSESSMENT AND PLAN;   #1.  Stage IIIb (T4N1M0) colon Ca s/p lap left-sided colectomy 02/2019 followed by neoadjuvant chemo.  Being closely followed by Dr. Truett Perna. Nl CEA, Neg.  CT Abdo/pelvis/chest.  Plan: -Proceed with colon for surveillance.  Explained risks and benefits to the patient patient's wife in detail. -Discussed familial screening as well.  HPI:    Oscar Mccarty is a 58 y.o. male  For follow-up visit.  No GI complaints.  No nausea, vomiting, heartburn, regurgitation, odynophagia or dysphagia.  No significant diarrhea or constipation.  No melena or hematochezia. No unintentional weight loss. No abdominal pain.  His son age 6 had GI problems.  Had negative ultrasound.  Getting EGD/colonoscopy by pediatric GI at [facility].   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   Migraines     Past Surgical History:  Procedure Laterality Date   COLONOSCOPY  12-19-2018  dr Chales Abrahams   [facility] REMOVAL Right 12/10/2019   Procedure: REMOVAL PORT-A-CATH;  Surgeon: Karie Soda, MD;  Location: [facility];  Service: General;  Laterality: Right;  mac and local   PORTACATH PLACEMENT N/A 04/09/2019   Procedure: PLACEMENT OF PORT-A-CATHETER UNDER ULTRASOUND AND FLUOROSCOPIC  GUIDANCE;  Surgeon: Karie Soda, MD;  Location: [facility];  Service: General;  Laterality: N/A;   PROCTOSCOPY N/A 03/11/2019   Procedure: RIGID PROCTOSCOPY;  Surgeon: Karie Soda, MD;  Location: [facility];  Service: General;  Laterality: N/A;   XI ROBOTIC ASSISTED COLOSTOMY TAKEDOWN N/A 03/11/2019   Procedure: XI ROBOTIC ASSISTED LEFT COLECTOMY, MOBILIZATION OF SPLENIC FLEXURE, BILATERAL TAP BLOCK;  Surgeon: Karie Soda, MD;  Location: [facility];  Service: General;  Laterality: N/A;    Family History  Problem Relation Age of Onset   Colon cancer Neg Hx    Esophageal cancer Neg Hx    Rectal cancer Neg Hx    Stomach cancer Neg Hx    Colon polyps Neg Hx     Social History   Tobacco Use   Smoking status: Never   Smokeless tobacco: Never  Vaping Use   Vaping status: Never Used  Substance Use Topics   Alcohol use: Not Currently   Drug use: No    Current Outpatient Medications  Medication Sig Dispense Refill   atorvastatin (LIPITOR) 10 MG tablet Take 10 mg by mouth every evening.      ibuprofen (ADVIL) 200 MG tablet Take 400 mg by mouth every 6 (six) hours as needed for headache or moderate pain.     tadalafil (CIALIS) 10 MG tablet Take 10 mg by mouth daily as needed for erectile dysfunction.     loratadine (CLARITIN) 10 MG tablet Take 10 mg by mouth daily as needed for allergies. (Patient not taking: Reported on  10/02/2023)     metFORMIN (GLUCOPHAGE) 500 MG tablet Take 1 tablet (500 mg total) by mouth 2 (two) times daily with a meal. (Patient taking differently: Take 500 mg by mouth daily with breakfast. Per Pt on 12/07/20) 60 tablet 11   ondansetron (ZOFRAN-ODT) 8 MG disintegrating tablet Take 8 mg by mouth 3 (three) times daily. (Patient not taking: Reported on 10/02/2023)     Current Facility-Administered Medications  Medication Dose Route Frequency Provider Last Rate Last Admin   0.9 %  sodium chloride infusion  500 mL Intravenous Once Lynann Bologna, MD        No Known  Allergies  Review of Systems:  neg    Physical Exam:    BP 124/66   Pulse (!) 54   Temp 97.7 F (36.5 C) (Temporal)   Ht 5\' 7"  (1.702 m)   Wt 177 lb (80.3 kg)   SpO2 99%   BMI 27.72 kg/m  Wt Readings from Last 3 Encounters:  10/02/23 177 lb (80.3 kg)  09/18/23 177 lb (80.3 kg)  07/11/23 175 lb 8 oz (79.6 kg)   Constitutional:  Well-developed, in no acute distress. Psychiatric: Normal mood and affect. Behavior is normal. HEENT: Pupils normal.  Conjunctivae are normal. No scleral icterus. Cardiovascular: Normal rate, regular rhythm. No edema Pulmonary/chest: Effort normal and breath sounds normal. No wheezing, rales or rhonchi. Abdominal: Soft, nondistended. Nontender. Bowel sounds active throughout. There are no masses palpable. No hepatomegaly.  Well-healed small surgical scars. Rectal:  defered Neurological: Alert and oriented to person place and time. Skin: Skin is warm and dry. No rashes noted.  Data Reviewed: I have personally reviewed following labs and imaging studies  CBC:    Latest Ref Rng & Units 11/26/2019   10:26 AM 10/02/2019    9:30 AM 09/16/2019    9:40 AM  CBC  WBC 4.0 - 10.5 K/uL 3.5  6.3  5.9   Hemoglobin 13.0 - 17.0 g/dL 24.4  01.0  27.2   Hematocrit 39.0 - 52.0 % 43.8  40.0  39.2   Platelets 150 - 400 K/uL 106  77  75     CMP:    Latest Ref Rng & Units 12/19/2022    7:54 AM 12/08/2021    9:38 AM 12/02/2020    9:04 AM  CMP  Glucose 70 - 99 mg/dL 536   644   BUN 6 - 20 mg/dL 14  10  12    Creatinine 0.61 - 1.24 mg/dL 0.34  7.42  5.95   Sodium 135 - 145 mmol/L 137   139   Potassium 3.5 - 5.1 mmol/L 4.1   4.4   Chloride 98 - 111 mmol/L 106   106   CO2 22 - 32 mmol/L 26   27   Calcium 8.9 - 10.3 mg/dL 8.5   9.3       Edman Circle, MD 10/02/2023, 10:01 AM  Cc: Nonnie Done., MD

## 2023-10-02 NOTE — Patient Instructions (Signed)
Resume previous diet.  Continue present medications. Repeat colonoscopy in 5 years for screening purposes.    YOU HAD AN ENDOSCOPIC PROCEDURE TODAY AT THE [facility]:   Refer to the procedure report that was given to you for any specific questions about what was found during the examination.  If the procedure report does not answer your questions, please call your gastroenterologist to clarify.  If you requested that your care partner not be given the details of your procedure findings, then the procedure report has been included in a sealed envelope for you to review at your convenience later.  YOU SHOULD EXPECT: Some feelings of bloating in the abdomen. Passage of more gas than usual.  Walking can help get rid of the air that was put into your GI tract during the procedure and reduce the bloating. If you had a lower endoscopy (such as a colonoscopy or flexible sigmoidoscopy) you may notice spotting of blood in your stool or on the toilet paper. If you underwent a bowel prep for your procedure, you may not have a normal bowel movement for a few days.  Please Note:  You might notice some irritation and congestion in your nose or some drainage.  This is from the oxygen used during your procedure.  There is no need for concern and it should clear up in a day or so.  SYMPTOMS TO REPORT IMMEDIATELY:  Following lower endoscopy (colonoscopy or flexible sigmoidoscopy):  Excessive amounts of blood in the stool  Significant tenderness or worsening of abdominal pains  Swelling of the abdomen that is new, acute  Fever of 100F or higher   For urgent or emergent issues, a gastroenterologist can be reached at any hour by calling [phone]. Do not use MyChart messaging for urgent concerns.    DIET:  We do recommend a small meal at first, but then you may proceed to your regular diet.  Drink plenty of fluids but you should avoid alcoholic beverages for 24 hours.  ACTIVITY:  You should  plan to take it easy for the rest of today and you should NOT DRIVE or use heavy machinery until tomorrow (because of the sedation medicines used during the test).    FOLLOW UP: Our staff will call the number listed on your records the next business day following your procedure.  We will call around 7:15- 8:00 am to check on you and address any questions or concerns that you may have regarding the information given to you following your procedure. If we do not reach you, we will leave a message.     If any biopsies were taken you will be contacted by phone or by letter within the next 1-3 weeks.  Please call us at [phone] if you have not heard about the biopsies in 3 weeks.    SIGNATURES/CONFIDENTIALITY: You and/or your care partner have signed paperwork which will be entered into your electronic medical record.  These signatures attest to the fact that that the information above on your After Visit Summary has been reviewed and is understood.  Full responsibility of the confidentiality of this discharge information lies with you and/or your care-partner.

## 2023-10-03 ENCOUNTER — Telehealth: Payer: Self-pay | Admitting: *Deleted

## 2023-10-03 NOTE — Telephone Encounter (Signed)
 Attempted post procedure follow up call.  No answer - LVM.    Interpreter used by telephone today at the [facility] for this follow up call to the pt.

## 2023-10-30 ENCOUNTER — Encounter (HOSPITAL_COMMUNITY): Payer: Self-pay

## 2023-10-30 ENCOUNTER — Emergency Department (HOSPITAL_COMMUNITY)
Admission: EM | Admit: 2023-10-30 | Discharge: 2023-10-30 | Discharge status: Against Medical Advice | Attending: Emergency Medicine | Admitting: Emergency Medicine

## 2023-10-30 ENCOUNTER — Other Ambulatory Visit: Payer: Self-pay

## 2023-10-30 DIAGNOSIS — R109 Unspecified abdominal pain: Secondary | ICD-10-CM | POA: Insufficient documentation

## 2023-10-30 DIAGNOSIS — Z5321 Procedure and treatment not carried out due to patient leaving prior to being seen by health care provider: Secondary | ICD-10-CM | POA: Diagnosis not present

## 2023-10-30 DIAGNOSIS — R42 Dizziness and giddiness: Secondary | ICD-10-CM | POA: Insufficient documentation

## 2023-10-30 LAB — CBC WITH DIFFERENTIAL/PLATELET
Abs Immature Granulocytes: 0.02 10*3/uL (ref 0.00–0.07)
Basophils Absolute: 0 10*3/uL (ref 0.0–0.1)
Basophils Relative: 1 %
Eosinophils Absolute: 0.1 10*3/uL (ref 0.0–0.5)
Eosinophils Relative: 3 %
HCT: 45.1 % (ref 39.0–52.0)
Hemoglobin: 15.8 g/dL (ref 13.0–17.0)
Immature Granulocytes: 0 %
Lymphocytes Relative: 26 %
Lymphs Abs: 1.4 10*3/uL (ref 0.7–4.0)
MCH: 30.5 pg (ref 26.0–34.0)
MCHC: 35 g/dL (ref 30.0–36.0)
MCV: 87.1 fL (ref 80.0–100.0)
Monocytes Absolute: 0.5 10*3/uL (ref 0.1–1.0)
Monocytes Relative: 8 %
Neutro Abs: 3.5 10*3/uL (ref 1.7–7.7)
Neutrophils Relative %: 62 %
Platelets: 125 10*3/uL — ABNORMAL LOW (ref 150–400)
RBC: 5.18 MIL/uL (ref 4.22–5.81)
RDW: 12 % (ref 11.5–15.5)
WBC: 5.6 10*3/uL (ref 4.0–10.5)
nRBC: 0 % (ref 0.0–0.2)

## 2023-10-30 LAB — COMPREHENSIVE METABOLIC PANEL WITH GFR
ALT: 29 U/L (ref 0–44)
AST: 22 U/L (ref 15–41)
Albumin: 4.2 g/dL (ref 3.5–5.0)
Alkaline Phosphatase: 68 U/L (ref 38–126)
Anion gap: 8 (ref 5–15)
BUN: 19 mg/dL (ref 6–20)
CO2: 20 mmol/L — ABNORMAL LOW (ref 22–32)
Calcium: 8.2 mg/dL — ABNORMAL LOW (ref 8.9–10.3)
Chloride: 102 mmol/L (ref 98–111)
Creatinine, Ser: 0.94 mg/dL (ref 0.61–1.24)
GFR, Estimated: >60 mL/min (ref >60)
Glucose, Bld: 238 mg/dL — ABNORMAL HIGH (ref 70–99)
Potassium: 3.7 mmol/L (ref 3.5–5.1)
Sodium: 130 mmol/L — ABNORMAL LOW (ref 135–145)
Total Bilirubin: 0.6 mg/dL (ref 0.0–1.2)
Total Protein: 7 g/dL (ref 6.5–8.1)

## 2023-10-30 LAB — URINALYSIS, ROUTINE W REFLEX MICROSCOPIC
Bilirubin Urine: NEGATIVE
Glucose, UA: 150 mg/dL — AB
Hgb urine dipstick: NEGATIVE
Ketones, ur: NEGATIVE mg/dL
Leukocytes,Ua: NEGATIVE
Nitrite: NEGATIVE
Protein, ur: NEGATIVE mg/dL
Specific Gravity, Urine: 1.009 (ref 1.005–1.030)
pH: 6 (ref 5.0–8.0)

## 2023-10-30 LAB — LIPASE, BLOOD: Lipase: 40 U/L (ref 11–51)

## 2023-10-30 LAB — CBG MONITORING, ED: Glucose-Capillary: 234 mg/dL — ABNORMAL HIGH (ref 70–99)

## 2023-10-30 NOTE — ED Notes (Signed)
 234 cbg

## 2023-10-30 NOTE — ED Triage Notes (Signed)
 PT reports feeling anxious and dizzy around 8-8:30pm. Pt reports waking up just prior to coming in feeling the same way. Pt also reports mild abdominal pain surrounding umbilicus. Pain is intermittent, mild, and squeezing. Denies N/V/D, fevers, CP, SOB.

## 2024-01-02 ENCOUNTER — Encounter: Payer: Self-pay | Admitting: Oncology

## 2024-01-09 ENCOUNTER — Inpatient Hospital Stay: Payer: BC Managed Care – PPO | Attending: Oncology

## 2024-01-09 ENCOUNTER — Inpatient Hospital Stay (HOSPITAL_BASED_OUTPATIENT_CLINIC_OR_DEPARTMENT_OTHER): Payer: BC Managed Care – PPO | Admitting: Oncology

## 2024-01-09 ENCOUNTER — Ambulatory Visit: Payer: Self-pay | Admitting: Oncology

## 2024-01-09 VITALS — BP 129/81 | HR 64 | Temp 97.9°F | Resp 18 | Ht 67.0 in | Wt 181.2 lb

## 2024-01-09 DIAGNOSIS — E119 Type 2 diabetes mellitus without complications: Secondary | ICD-10-CM | POA: Insufficient documentation

## 2024-01-09 DIAGNOSIS — C187 Malignant neoplasm of sigmoid colon: Secondary | ICD-10-CM | POA: Diagnosis not present

## 2024-01-09 DIAGNOSIS — Z8616 Personal history of COVID-19: Secondary | ICD-10-CM | POA: Insufficient documentation

## 2024-01-09 DIAGNOSIS — Z85038 Personal history of other malignant neoplasm of large intestine: Secondary | ICD-10-CM | POA: Diagnosis present

## 2024-01-09 LAB — CEA (ACCESS): CEA (CHCC): 1.1 ng/mL (ref 0.00–5.00)

## 2024-01-09 NOTE — Progress Notes (Signed)
  [facility] OFFICE PROGRESS NOTE   Diagnosis: Colon cancer  INTERVAL HISTORY:   Oscar Mccarty returns as scheduled.  He feels well.  No difficulty with bowel function.  No bleeding.  No neuropathy symptoms.  He had a colonoscopy 10/02/2023.  No polyps were found.  Objective:  Vital signs in last 24 hours:  Blood pressure 129/81, pulse 64, temperature 97.9 F (36.6 C), temperature source Temporal, resp. rate 18, height 5' 7 (1.702 m), weight 181 lb 3.2 oz (82.2 kg), SpO2 100%.    Lymphatics: No cervical, supraclavicular, axillary, or inguinal nodes Resp: Lungs clear bilaterally Cardio: Regular rate and rhythm GI: No hepatosplenomegaly, no mass, nontender Vascular: No leg edema   Lab Results:  Lab Results  Component Value Date   WBC 5.6 10/30/2023   HGB 15.8 10/30/2023   HCT 45.1 10/30/2023   MCV 87.1 10/30/2023   PLT 125 (L) 10/30/2023   NEUTROABS 3.5 10/30/2023    CMP  Lab Results  Component Value Date   NA 130 (L) 10/30/2023   K 3.7 10/30/2023   CL 102 10/30/2023   CO2 20 (L) 10/30/2023   GLUCOSE 238 (H) 10/30/2023   BUN 19 10/30/2023   CREATININE 0.94 10/30/2023   CALCIUM  8.2 (L) 10/30/2023   PROT 7.0 10/30/2023   ALBUMIN 4.2 10/30/2023   AST 22 10/30/2023   ALT 29 10/30/2023   ALKPHOS 68 10/30/2023   BILITOT 0.6 10/30/2023   GFRNONAA >60 10/30/2023   GFRAA >60 11/26/2019    Lab Results  Component Value Date   CEA1 2.41 12/02/2020   CEA 1.24 07/11/2023    Medications: I have reviewed the patient's current medications.   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 Microsatellite stable, no loss of mismatch repair protein expression CTs 12/24/2018- sigmoid: Wall thickening, multiple enlarged nodes adjacent to the sigmoid colon, no evidence of metastatic disease Cycle 1 FOLFOX 04/14/2019 Cycle 2 FOLFOX 04/30/2019, Udenyca  added  Cycle 3 FOLFOX 05/14/2019 (oxaliplatin  dose  reduced to 65 mg/m due to thrombocytopenia) Cycle 4 FOLFOX 05/27/2019 (continue oxaliplatin  dose reduction to 65mg /m2, PLT 129K) Cycle 5 FOLFOX 06/10/2019 Cycle 6 FOLFOX 06/24/2019 Cycle 7 FOLFOX 07/08/2019  Cycle 8 FOLFOX 07/22/2019 Cycle 9 FOLFOX 08/05/2019 Cycle 10 FOLFOX 08/19/2019 Cycle 11 FOLFOX 09/02/2019 Cycle 12 FOLFOX 09/16/2019 CTs 11/26/2019-negative Colonoscopy 05/16/2020-patent end-to-end colocolonic anastomosis, characterized by healthy-appearing mucosa; no recurrence; next colonoscopy 3 years for surveillance CTs 12/02/2020-negative CTs 12/08/2021-negative CTs 12/19/2022-negative Colonoscopy 10/02/2023-no polyps COVID-19 respiratory infection June 2020 Diabetes diagnosed June 2020 Port-A-Cath placement, Oscar Mccarty, 04/09/2019; Port-A-Cath removed 12/10/2019 5. Heartburn following chemotherapy, prilosec added 06/10/19 6. Periodic dizziness on standing, no significant orthostatic hypotension. Hold lisinopril as of 06/24/19  7.  Oxaliplatin  neuropathy-resolved         Disposition: Oscar Mccarty is in clinical remission from colon cancer.  He is now 5 years out from diagnosis.  He will continue colonoscopy surveillance with Dr. Venice Mccarty.  He would like to continue follow-up at the cancer center.  He will return for an office visit in 6 months.  We will follow-up on the CEA from today.  Oscar Deep, MD  01/09/2024  12:01 PM

## 2024-01-09 NOTE — Telephone Encounter (Signed)
Notified of normal CEA and f/u as scheduled. 

## 2024-07-10 ENCOUNTER — Ambulatory Visit: Admitting: Oncology

## 2024-07-10 ENCOUNTER — Other Ambulatory Visit

## 2024-07-14 ENCOUNTER — Inpatient Hospital Stay: Attending: Oncology

## 2024-07-14 ENCOUNTER — Ambulatory Visit: Admitting: Oncology

## 2024-07-14 ENCOUNTER — Inpatient Hospital Stay

## 2024-07-14 VITALS — BP 119/77 | HR 60 | Temp 97.7°F | Resp 18 | Ht 67.0 in | Wt 179.3 lb

## 2024-07-14 DIAGNOSIS — Z85038 Personal history of other malignant neoplasm of large intestine: Secondary | ICD-10-CM | POA: Insufficient documentation

## 2024-07-14 DIAGNOSIS — Z9221 Personal history of antineoplastic chemotherapy: Secondary | ICD-10-CM | POA: Insufficient documentation

## 2024-07-14 DIAGNOSIS — Z23 Encounter for immunization: Secondary | ICD-10-CM | POA: Diagnosis not present

## 2024-07-14 DIAGNOSIS — C187 Malignant neoplasm of sigmoid colon: Secondary | ICD-10-CM

## 2024-07-14 LAB — CEA (ACCESS): CEA (CHCC): 1.38 ng/mL (ref 0.00–5.00)

## 2024-07-14 MED ORDER — INFLUENZA VIRUS VACC SPLIT PF (FLUZONE) 0.5 ML IM SUSY
0.5000 mL | PREFILLED_SYRINGE | Freq: Once | INTRAMUSCULAR | Status: AC
  Administered 2024-07-14: 0.5 mL via INTRAMUSCULAR
  Filled 2024-07-14: qty 0.5

## 2024-07-14 NOTE — Progress Notes (Signed)
" °  [facility] OFFICE PROGRESS NOTE   Diagnosis: Colon cancer  INTERVAL HISTORY:   Mr. Bruni returns as scheduled.  He feels well.  He is working.  Good appetite.  No difficulty with bowel function.  No bleeding. He reports insomnia after waking up during the night. Objective:  Vital signs in last 24 hours:  Blood pressure 119/77, pulse 60, temperature 97.7 F (36.5 C), temperature source Temporal, resp. rate 18, height 5' 7 (1.702 m), weight 179 lb 4.8 oz (81.3 kg), SpO2 100%.    Lymphatics: No cervical, supraclavicular, axillary, or inguinal nodes Resp: Lungs clear bilaterally Cardio: Regular rate and rhythm GI: No mass, nontender, no hepatosplenomegaly Vascular: No leg edema   Lab Results:  Lab Results  Component Value Date   WBC 5.6 10/30/2023   HGB 15.8 10/30/2023   HCT 45.1 10/30/2023   MCV 87.1 10/30/2023   PLT 125 (L) 10/30/2023   NEUTROABS 3.5 10/30/2023    CMP  Lab Results  Component Value Date   NA 130 (L) 10/30/2023   K 3.7 10/30/2023   CL 102 10/30/2023   CO2 20 (L) 10/30/2023   GLUCOSE 238 (H) 10/30/2023   BUN 19 10/30/2023   CREATININE 0.94 10/30/2023   CALCIUM  8.2 (L) 10/30/2023   PROT 7.0 10/30/2023   ALBUMIN 4.2 10/30/2023   AST 22 10/30/2023   ALT 29 10/30/2023   ALKPHOS 68 10/30/2023   BILITOT 0.6 10/30/2023   GFRNONAA >60 10/30/2023   GFRAA >60 11/26/2019    Lab Results  Component Value Date   CEA1 2.41 12/02/2020   CEA 1.38 07/14/2024    No results found for: INR, LABPROT  Imaging:  No results found.  Medications: I have reviewed the patient's current medications.   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 Microsatellite stable, no loss of mismatch repair protein expression CTs 12/24/2018- sigmoid: Wall thickening, multiple enlarged nodes adjacent to the sigmoid colon, no evidence of metastatic disease Cycle 1 FOLFOX 04/14/2019 Cycle  2 FOLFOX 04/30/2019, Udenyca  added  Cycle 3 FOLFOX 05/14/2019 (oxaliplatin  dose reduced to 65 mg/m due to thrombocytopenia) Cycle 4 FOLFOX 05/27/2019 (continue oxaliplatin  dose reduction to 65mg /m2, PLT 129K) Cycle 5 FOLFOX 06/10/2019 Cycle 6 FOLFOX 06/24/2019 Cycle 7 FOLFOX 07/08/2019  Cycle 8 FOLFOX 07/22/2019 Cycle 9 FOLFOX 08/05/2019 Cycle 10 FOLFOX 08/19/2019 Cycle 11 FOLFOX 09/02/2019 Cycle 12 FOLFOX 09/16/2019 CTs 11/26/2019-negative Colonoscopy 05/16/2020-patent end-to-end colocolonic anastomosis, characterized by healthy-appearing mucosa; no recurrence; next colonoscopy 3 years for surveillance CTs 12/02/2020-negative CTs 12/08/2021-negative CTs 12/19/2022-negative Colonoscopy 10/02/2023-no polyps COVID-19 respiratory infection June 2020 Diabetes diagnosed June 2020 Port-A-Cath placement, Dr. Sheldon, 04/09/2019; Port-A-Cath removed 12/10/2019 5. Heartburn following chemotherapy, prilosec added 06/10/19 6. Periodic dizziness on standing, no significant orthostatic hypotension. Hold lisinopril as of 06/24/19  7.  Oxaliplatin  neuropathy-resolved          Disposition: Oscar Mccarty is in clinical remission from colon cancer.  He would like to continue follow-up at the Cancer center.  He will return for an office visit in 6 months.  He received an influenza vaccine today.  He will discuss the insomnia with his primary provider.  Arley Hof, MD  07/14/2024  10:58 AM   "

## 2025-01-12 ENCOUNTER — Inpatient Hospital Stay

## 2025-01-12 ENCOUNTER — Inpatient Hospital Stay: Admitting: Oncology
